# Patient Record
Sex: Female | Born: 1960 | Race: Black or African American | Hispanic: No | Marital: Married | Smoking: Never smoker
Health system: Southern US, Community
[De-identification: ages and names within clinical notes are randomized; demographics above are authoritative.]

## PROBLEM LIST (undated history)

## (undated) DIAGNOSIS — T7840XA Allergy, unspecified, initial encounter: Secondary | ICD-10-CM

## (undated) DIAGNOSIS — H409 Unspecified glaucoma: Secondary | ICD-10-CM

## (undated) DIAGNOSIS — M5136 Other intervertebral disc degeneration, lumbar region: Secondary | ICD-10-CM

## (undated) DIAGNOSIS — M51369 Other intervertebral disc degeneration, lumbar region without mention of lumbar back pain or lower extremity pain: Secondary | ICD-10-CM

## (undated) DIAGNOSIS — E785 Hyperlipidemia, unspecified: Secondary | ICD-10-CM

## (undated) DIAGNOSIS — J45909 Unspecified asthma, uncomplicated: Secondary | ICD-10-CM

## (undated) HISTORY — DX: Allergy, unspecified, initial encounter: T78.40XA

## (undated) HISTORY — DX: Unspecified asthma, uncomplicated: J45.909

## (undated) HISTORY — DX: Other intervertebral disc degeneration, lumbar region: M51.36

## (undated) HISTORY — DX: Unspecified glaucoma: H40.9

## (undated) HISTORY — PX: CARPAL TUNNEL RELEASE: SHX101

## (undated) HISTORY — PX: OTHER SURGICAL HISTORY: SHX169

## (undated) HISTORY — DX: Hyperlipidemia, unspecified: E78.5

## (undated) HISTORY — DX: Other intervertebral disc degeneration, lumbar region without mention of lumbar back pain or lower extremity pain: M51.369

---

## 1997-12-15 ENCOUNTER — Other Ambulatory Visit: Admission: RE | Admit: 1997-12-15 | Discharge: 1997-12-15 | Payer: Self-pay | Admitting: Obstetrics

## 1998-01-29 ENCOUNTER — Ambulatory Visit (HOSPITAL_COMMUNITY): Admission: RE | Admit: 1998-01-29 | Discharge: 1998-01-29 | Payer: Self-pay | Admitting: Obstetrics and Gynecology

## 1998-03-16 ENCOUNTER — Other Ambulatory Visit: Admission: RE | Admit: 1998-03-16 | Discharge: 1998-03-16 | Payer: Self-pay | Admitting: Obstetrics & Gynecology

## 1998-03-16 ENCOUNTER — Encounter: Admission: RE | Admit: 1998-03-16 | Discharge: 1998-03-16 | Payer: Self-pay | Admitting: Obstetrics & Gynecology

## 1998-11-04 ENCOUNTER — Emergency Department (HOSPITAL_COMMUNITY): Admission: EM | Admit: 1998-11-04 | Discharge: 1998-11-04 | Payer: Self-pay | Admitting: Emergency Medicine

## 1998-11-04 ENCOUNTER — Encounter: Payer: Self-pay | Admitting: Emergency Medicine

## 1999-06-15 ENCOUNTER — Emergency Department (HOSPITAL_COMMUNITY): Admission: EM | Admit: 1999-06-15 | Discharge: 1999-06-15 | Payer: Self-pay | Admitting: Emergency Medicine

## 1999-11-10 ENCOUNTER — Emergency Department (HOSPITAL_COMMUNITY): Admission: EM | Admit: 1999-11-10 | Discharge: 1999-11-11 | Payer: Self-pay | Admitting: Emergency Medicine

## 2000-07-09 ENCOUNTER — Other Ambulatory Visit: Admission: RE | Admit: 2000-07-09 | Discharge: 2000-07-09 | Payer: Self-pay | Admitting: Internal Medicine

## 2001-12-21 ENCOUNTER — Other Ambulatory Visit: Admission: RE | Admit: 2001-12-21 | Discharge: 2001-12-21 | Payer: Self-pay | Admitting: Gynecology

## 2002-12-12 ENCOUNTER — Other Ambulatory Visit: Admission: RE | Admit: 2002-12-12 | Discharge: 2002-12-12 | Payer: Self-pay | Admitting: Gynecology

## 2003-05-11 ENCOUNTER — Emergency Department (HOSPITAL_COMMUNITY): Admission: EM | Admit: 2003-05-11 | Discharge: 2003-05-12 | Payer: Self-pay | Admitting: Emergency Medicine

## 2003-05-17 ENCOUNTER — Emergency Department (HOSPITAL_COMMUNITY): Admission: EM | Admit: 2003-05-17 | Discharge: 2003-05-17 | Payer: Self-pay | Admitting: Emergency Medicine

## 2003-06-03 ENCOUNTER — Emergency Department (HOSPITAL_COMMUNITY): Admission: EM | Admit: 2003-06-03 | Discharge: 2003-06-03 | Payer: Self-pay | Admitting: Emergency Medicine

## 2003-06-03 ENCOUNTER — Encounter: Payer: Self-pay | Admitting: Emergency Medicine

## 2003-06-07 ENCOUNTER — Ambulatory Visit (HOSPITAL_COMMUNITY): Admission: RE | Admit: 2003-06-07 | Discharge: 2003-06-07 | Payer: Self-pay | Admitting: *Deleted

## 2003-07-05 ENCOUNTER — Encounter: Admission: RE | Admit: 2003-07-05 | Discharge: 2003-07-05 | Payer: Self-pay | Admitting: Family Medicine

## 2003-07-07 ENCOUNTER — Emergency Department (HOSPITAL_COMMUNITY): Admission: EM | Admit: 2003-07-07 | Discharge: 2003-07-07 | Payer: Self-pay | Admitting: Emergency Medicine

## 2003-08-02 ENCOUNTER — Ambulatory Visit (HOSPITAL_COMMUNITY): Admission: RE | Admit: 2003-08-02 | Discharge: 2003-08-02 | Payer: Self-pay | Admitting: Podiatry

## 2003-09-18 ENCOUNTER — Emergency Department (HOSPITAL_COMMUNITY): Admission: EM | Admit: 2003-09-18 | Discharge: 2003-09-18 | Payer: Self-pay | Admitting: Emergency Medicine

## 2003-09-27 ENCOUNTER — Encounter: Admission: RE | Admit: 2003-09-27 | Discharge: 2003-09-27 | Payer: Self-pay | Admitting: Family Medicine

## 2003-10-24 ENCOUNTER — Encounter: Admission: RE | Admit: 2003-10-24 | Discharge: 2004-01-22 | Payer: Self-pay | Admitting: Podiatry

## 2003-10-27 ENCOUNTER — Encounter: Admission: RE | Admit: 2003-10-27 | Discharge: 2003-10-27 | Payer: Self-pay | Admitting: Sports Medicine

## 2003-12-26 ENCOUNTER — Encounter: Admission: RE | Admit: 2003-12-26 | Discharge: 2003-12-26 | Payer: Self-pay | Admitting: Family Medicine

## 2004-01-14 ENCOUNTER — Emergency Department (HOSPITAL_COMMUNITY): Admission: EM | Admit: 2004-01-14 | Discharge: 2004-01-14 | Payer: Self-pay | Admitting: Emergency Medicine

## 2004-01-17 ENCOUNTER — Ambulatory Visit: Payer: Self-pay | Admitting: Family Medicine

## 2004-02-19 ENCOUNTER — Ambulatory Visit: Payer: Self-pay | Admitting: Sports Medicine

## 2004-03-06 ENCOUNTER — Ambulatory Visit: Payer: Self-pay | Admitting: Family Medicine

## 2004-03-18 ENCOUNTER — Observation Stay (HOSPITAL_COMMUNITY): Admission: AD | Admit: 2004-03-18 | Discharge: 2004-03-20 | Payer: Self-pay | Admitting: Orthopedic Surgery

## 2004-03-18 ENCOUNTER — Encounter (INDEPENDENT_AMBULATORY_CARE_PROVIDER_SITE_OTHER): Payer: Self-pay | Admitting: *Deleted

## 2004-03-23 ENCOUNTER — Emergency Department (HOSPITAL_COMMUNITY): Admission: EM | Admit: 2004-03-23 | Discharge: 2004-03-23 | Payer: Self-pay | Admitting: Emergency Medicine

## 2004-05-01 ENCOUNTER — Emergency Department (HOSPITAL_COMMUNITY): Admission: EM | Admit: 2004-05-01 | Discharge: 2004-05-01 | Payer: Self-pay

## 2004-05-02 ENCOUNTER — Ambulatory Visit: Payer: Self-pay | Admitting: Family Medicine

## 2004-05-19 ENCOUNTER — Emergency Department (HOSPITAL_COMMUNITY): Admission: EM | Admit: 2004-05-19 | Discharge: 2004-05-19 | Payer: Self-pay | Admitting: Emergency Medicine

## 2004-05-28 ENCOUNTER — Ambulatory Visit: Payer: Self-pay | Admitting: Family Medicine

## 2004-06-07 ENCOUNTER — Ambulatory Visit (HOSPITAL_COMMUNITY): Admission: RE | Admit: 2004-06-07 | Discharge: 2004-06-07 | Payer: Self-pay | Admitting: Family Medicine

## 2004-06-11 ENCOUNTER — Ambulatory Visit: Payer: Self-pay | Admitting: Family Medicine

## 2004-06-12 ENCOUNTER — Ambulatory Visit: Payer: Self-pay | Admitting: Family Medicine

## 2004-10-16 ENCOUNTER — Emergency Department (HOSPITAL_COMMUNITY): Admission: EM | Admit: 2004-10-16 | Discharge: 2004-10-16 | Payer: Self-pay | Admitting: Emergency Medicine

## 2004-11-08 ENCOUNTER — Ambulatory Visit: Payer: Self-pay | Admitting: Family Medicine

## 2004-11-15 ENCOUNTER — Ambulatory Visit: Payer: Self-pay | Admitting: Family Medicine

## 2004-12-04 ENCOUNTER — Other Ambulatory Visit: Admission: RE | Admit: 2004-12-04 | Discharge: 2004-12-04 | Payer: Self-pay | Admitting: Family Medicine

## 2004-12-04 ENCOUNTER — Ambulatory Visit: Payer: Self-pay | Admitting: Sports Medicine

## 2004-12-08 ENCOUNTER — Encounter (INDEPENDENT_AMBULATORY_CARE_PROVIDER_SITE_OTHER): Payer: Self-pay | Admitting: *Deleted

## 2004-12-08 LAB — CONVERTED CEMR LAB

## 2004-12-13 ENCOUNTER — Emergency Department (HOSPITAL_COMMUNITY): Admission: EM | Admit: 2004-12-13 | Discharge: 2004-12-13 | Payer: Self-pay | Admitting: Family Medicine

## 2004-12-14 ENCOUNTER — Emergency Department (HOSPITAL_COMMUNITY): Admission: EM | Admit: 2004-12-14 | Discharge: 2004-12-14 | Payer: Self-pay | Admitting: Family Medicine

## 2004-12-16 ENCOUNTER — Ambulatory Visit: Payer: Self-pay | Admitting: Family Medicine

## 2004-12-17 ENCOUNTER — Ambulatory Visit: Payer: Self-pay | Admitting: Family Medicine

## 2004-12-19 ENCOUNTER — Emergency Department (HOSPITAL_COMMUNITY): Admission: EM | Admit: 2004-12-19 | Discharge: 2004-12-20 | Payer: Self-pay | Admitting: *Deleted

## 2004-12-28 ENCOUNTER — Emergency Department (HOSPITAL_COMMUNITY): Admission: EM | Admit: 2004-12-28 | Discharge: 2004-12-28 | Payer: Self-pay | Admitting: Family Medicine

## 2005-01-03 ENCOUNTER — Ambulatory Visit: Payer: Self-pay | Admitting: Family Medicine

## 2005-02-26 ENCOUNTER — Emergency Department (HOSPITAL_COMMUNITY): Admission: EM | Admit: 2005-02-26 | Discharge: 2005-02-26 | Payer: Self-pay | Admitting: Family Medicine

## 2006-01-02 ENCOUNTER — Ambulatory Visit (HOSPITAL_COMMUNITY): Admission: RE | Admit: 2006-01-02 | Discharge: 2006-01-02 | Payer: Self-pay | Admitting: Family Medicine

## 2006-03-12 ENCOUNTER — Ambulatory Visit: Payer: Self-pay | Admitting: Sports Medicine

## 2006-05-15 ENCOUNTER — Other Ambulatory Visit: Admission: RE | Admit: 2006-05-15 | Discharge: 2006-05-15 | Payer: Self-pay | Admitting: Family Medicine

## 2006-05-19 ENCOUNTER — Encounter: Admission: RE | Admit: 2006-05-19 | Discharge: 2006-05-19 | Payer: Self-pay | Admitting: Family Medicine

## 2006-07-02 DIAGNOSIS — D179 Benign lipomatous neoplasm, unspecified: Secondary | ICD-10-CM | POA: Insufficient documentation

## 2006-07-02 DIAGNOSIS — N949 Unspecified condition associated with female genital organs and menstrual cycle: Secondary | ICD-10-CM | POA: Insufficient documentation

## 2006-07-02 DIAGNOSIS — J45909 Unspecified asthma, uncomplicated: Secondary | ICD-10-CM | POA: Insufficient documentation

## 2006-07-02 DIAGNOSIS — G43909 Migraine, unspecified, not intractable, without status migrainosus: Secondary | ICD-10-CM | POA: Insufficient documentation

## 2006-07-03 ENCOUNTER — Encounter (INDEPENDENT_AMBULATORY_CARE_PROVIDER_SITE_OTHER): Payer: Self-pay | Admitting: *Deleted

## 2007-02-09 ENCOUNTER — Ambulatory Visit: Payer: Self-pay | Admitting: Family Medicine

## 2007-02-12 ENCOUNTER — Encounter: Admission: RE | Admit: 2007-02-12 | Discharge: 2007-02-12 | Payer: Self-pay | Admitting: Family Medicine

## 2007-02-22 ENCOUNTER — Ambulatory Visit: Payer: Self-pay | Admitting: Family Medicine

## 2007-03-30 ENCOUNTER — Ambulatory Visit: Payer: Self-pay | Admitting: Family Medicine

## 2007-07-13 ENCOUNTER — Ambulatory Visit: Payer: Self-pay | Admitting: Family Medicine

## 2008-01-17 ENCOUNTER — Ambulatory Visit: Payer: Self-pay | Admitting: Family Medicine

## 2008-01-18 ENCOUNTER — Encounter: Admission: RE | Admit: 2008-01-18 | Discharge: 2008-01-18 | Payer: Self-pay | Admitting: Family Medicine

## 2008-01-21 ENCOUNTER — Encounter: Admission: RE | Admit: 2008-01-21 | Discharge: 2008-01-21 | Payer: Self-pay | Admitting: Family Medicine

## 2008-01-21 ENCOUNTER — Ambulatory Visit: Payer: Self-pay | Admitting: Family Medicine

## 2008-02-07 ENCOUNTER — Ambulatory Visit (HOSPITAL_COMMUNITY): Admission: RE | Admit: 2008-02-07 | Discharge: 2008-02-07 | Payer: Self-pay | Admitting: Family Medicine

## 2008-03-31 ENCOUNTER — Ambulatory Visit: Payer: Self-pay | Admitting: Family Medicine

## 2008-04-04 HISTORY — PX: ROTATOR CUFF REPAIR: SHX139

## 2008-04-27 ENCOUNTER — Emergency Department (HOSPITAL_COMMUNITY): Admission: EM | Admit: 2008-04-27 | Discharge: 2008-04-27 | Payer: Self-pay | Admitting: Emergency Medicine

## 2008-05-30 ENCOUNTER — Ambulatory Visit: Payer: Self-pay | Admitting: Family Medicine

## 2008-07-03 ENCOUNTER — Ambulatory Visit: Payer: Self-pay | Admitting: Family Medicine

## 2008-08-14 ENCOUNTER — Ambulatory Visit: Payer: Self-pay | Admitting: Family Medicine

## 2008-10-09 ENCOUNTER — Ambulatory Visit: Payer: Self-pay | Admitting: Family Medicine

## 2009-02-01 ENCOUNTER — Ambulatory Visit: Payer: Self-pay | Admitting: Family Medicine

## 2009-04-02 ENCOUNTER — Ambulatory Visit: Payer: Self-pay | Admitting: Family Medicine

## 2009-04-02 ENCOUNTER — Encounter: Admission: RE | Admit: 2009-04-02 | Discharge: 2009-04-02 | Payer: Self-pay | Admitting: Family Medicine

## 2009-04-08 ENCOUNTER — Emergency Department (HOSPITAL_COMMUNITY): Admission: EM | Admit: 2009-04-08 | Discharge: 2009-04-08 | Payer: Self-pay | Admitting: Family Medicine

## 2009-04-09 ENCOUNTER — Ambulatory Visit: Payer: Self-pay | Admitting: Family Medicine

## 2009-04-10 ENCOUNTER — Emergency Department (HOSPITAL_COMMUNITY): Admission: EM | Admit: 2009-04-10 | Discharge: 2009-04-10 | Payer: Self-pay | Admitting: Emergency Medicine

## 2009-04-11 ENCOUNTER — Ambulatory Visit: Payer: Self-pay | Admitting: Family Medicine

## 2009-04-17 ENCOUNTER — Encounter: Admission: RE | Admit: 2009-04-17 | Discharge: 2009-04-17 | Payer: Self-pay | Admitting: Family Medicine

## 2009-04-18 ENCOUNTER — Ambulatory Visit: Payer: Self-pay | Admitting: Family Medicine

## 2009-04-19 ENCOUNTER — Encounter: Admission: RE | Admit: 2009-04-19 | Discharge: 2009-04-19 | Payer: Self-pay | Admitting: Neurosurgery

## 2009-04-25 ENCOUNTER — Ambulatory Visit (HOSPITAL_COMMUNITY): Admission: RE | Admit: 2009-04-25 | Discharge: 2009-04-26 | Payer: Self-pay | Admitting: Neurosurgery

## 2009-06-06 ENCOUNTER — Ambulatory Visit: Payer: Self-pay | Admitting: Family Medicine

## 2009-12-17 ENCOUNTER — Ambulatory Visit: Payer: Self-pay | Admitting: Family Medicine

## 2010-01-28 ENCOUNTER — Ambulatory Visit: Payer: Self-pay | Admitting: Family Medicine

## 2010-05-27 ENCOUNTER — Encounter: Payer: Self-pay | Admitting: Family Medicine

## 2010-06-03 ENCOUNTER — Ambulatory Visit
Admission: RE | Admit: 2010-06-03 | Discharge: 2010-06-03 | Payer: Self-pay | Source: Home / Self Care | Attending: Family Medicine | Admitting: Family Medicine

## 2010-06-03 DIAGNOSIS — M545 Low back pain: Secondary | ICD-10-CM | POA: Insufficient documentation

## 2010-06-03 DIAGNOSIS — F411 Generalized anxiety disorder: Secondary | ICD-10-CM | POA: Insufficient documentation

## 2010-06-03 DIAGNOSIS — I10 Essential (primary) hypertension: Secondary | ICD-10-CM | POA: Insufficient documentation

## 2010-06-03 DIAGNOSIS — M5126 Other intervertebral disc displacement, lumbar region: Secondary | ICD-10-CM | POA: Insufficient documentation

## 2010-06-04 ENCOUNTER — Encounter: Payer: Self-pay | Admitting: Family Medicine

## 2010-06-11 ENCOUNTER — Encounter (HOSPITAL_COMMUNITY)
Admission: RE | Admit: 2010-06-11 | Discharge: 2010-06-11 | Disposition: A | Discharge status: Home / Self Care | Payer: Managed Care, Other (non HMO) | Source: Ambulatory Visit | Attending: Orthopedic Surgery | Admitting: Orthopedic Surgery

## 2010-06-11 ENCOUNTER — Ambulatory Visit (HOSPITAL_COMMUNITY)
Admission: RE | Admit: 2010-06-11 | Discharge: 2010-06-11 | Disposition: A | Discharge status: Home / Self Care | Payer: Managed Care, Other (non HMO) | Source: Ambulatory Visit | Attending: Orthopedic Surgery | Admitting: Orthopedic Surgery

## 2010-06-11 ENCOUNTER — Other Ambulatory Visit (HOSPITAL_COMMUNITY): Payer: Self-pay | Admitting: Orthopedic Surgery

## 2010-06-11 DIAGNOSIS — M545 Low back pain, unspecified: Secondary | ICD-10-CM | POA: Insufficient documentation

## 2010-06-11 DIAGNOSIS — IMO0002 Reserved for concepts with insufficient information to code with codable children: Secondary | ICD-10-CM

## 2010-06-11 DIAGNOSIS — Z01818 Encounter for other preprocedural examination: Secondary | ICD-10-CM | POA: Insufficient documentation

## 2010-06-11 LAB — BASIC METABOLIC PANEL
CO2: 28 mEq/L (ref 19–32)
Calcium: 9.6 mg/dL (ref 8.4–10.5)
Chloride: 107 mEq/L (ref 96–112)
GFR calc Af Amer: >60 mL/min (ref >60)
Potassium: 4.7 mEq/L (ref 3.5–5.1)
Sodium: 142 mEq/L (ref 135–145)

## 2010-06-11 LAB — CBC
HCT: 36.4 % (ref 36.0–46.0)
MCH: 27.7 pg (ref 26.0–34.0)
MCHC: 33.5 g/dL (ref 30.0–36.0)
MCV: 82.5 fL (ref 78.0–100.0)
RBC: 4.41 MIL/uL (ref 3.87–5.11)
RDW: 12.3 % (ref 11.5–15.5)
WBC: 6.7 10*3/uL (ref 4.0–10.5)

## 2010-06-11 LAB — TYPE AND SCREEN: Antibody Screen: NEGATIVE

## 2010-06-11 LAB — ABO/RH: ABO/RH(D): A POS

## 2010-06-11 LAB — SURGICAL PCR SCREEN: Staphylococcus aureus: NEGATIVE

## 2010-06-12 NOTE — Assessment & Plan Note (Signed)
Summary: CHECK UP/EVM   Vital Signs:  Patient Profile:   50 Years Old Female CC:      elevated blood pressure and anxiety Temp:     98.6 degrees F oral Pulse rate:   82 / minute Pulse rhythm:   regular Resp:     16 per minute BP sitting:   159 / 105  (right arm)  Pt. in pain?   yes    Location:   back                   Current Allergies: No known allergies History of Present Illness History from: patient Reason for visit: follow up of multiple problems Chief Complaint: elevated blood pressure and anxiety History of Present Illness: The patient is presenting today for an evaluation for elevated blood pressure and an anxiety disorder.  She was previously seen by me at [facility].  She is reporting that she stopped the medications that I had given her for anxiety.  She is not sure the name of the medication and we don't have her records here from [facility].  The patient reports that she is struggling with her teenage daughter at home and is having anxiety episodes and constant worrying about her daughter.  The patient is reporting that her blood pressure has also been elevated and has been 180/100 at times.  She says that she is not taking any blood pressure lowering medications at this time.   She denies CP, SOB, n/v/d.  Pt has had some palpatations to report as well.    REVIEW OF SYSTEMS Constitutional Symptoms      Denies fever, chills, night sweats, weight loss, weight gain, and fatigue.  Eyes       Denies change in vision, eye pain, eye discharge, glasses, contact lenses, and eye surgery. Ear/Nose/Throat/Mouth       Denies hearing loss/aids, change in hearing, ear pain, ear discharge, dizziness, frequent runny nose, frequent nose bleeds, sinus problems, sore throat, hoarseness, and tooth pain or bleeding.  Respiratory       Complains of shortness of breath and asthma.      Denies dry cough, productive cough, wheezing, bronchitis, and emphysema/COPD.       Comments: SOB related to asthma attacks per patient.  Asthma attacks have been rare per patient.  Cardiovascular       Denies murmurs, chest pain, and tires easily with exhertion.    Gastrointestinal       Denies stomach pain, nausea/vomiting, diarrhea, constipation, blood in bowel movements, and indigestion. Genitourniary       Denies painful urination, kidney stones, and loss of urinary control. Neurological       Denies paralysis, seizures, and fainting/blackouts. Musculoskeletal       Denies muscle pain, joint pain, joint stiffness, decreased range of motion, redness, swelling, muscle weakness, and gout.  Skin       Denies bruising, unusual mles/lumps or sores, and hair/skin or nail changes.  Psych       Complains of anxiety/stress and sleep problems.      Denies mood changes, temper/anger issues, speech problems, and depression.  Past History:  Family History: Last updated: 06/03/2010 1 brother, 1 sister- healthy, Father 79, healthy, Mother died age 35, unknown cause, no family h/o cancer, heart dz, asthma, diabetes mellitus  Social History: Last updated: 06/03/2010 Moved from [place] 1986.  3 daughters.  Lives w/ 2 daughters and her husband who smokes.  Does not exercise. No  Tobacco, ETOH, or recreational drugs.  Past Medical History: G2P2, vision probs--> referred to optho, weight gain after moving to [place] HTN Asthma Generalized Anxiety Disorder DDD lumbar spine with herniated disc  Past Surgical History: R bunion surgery 2/05 - 07/05/2003 Back Surgery 2010 Anterior Discectomy Jan 2011  Family History: 1 brother, 1 sister- healthy, Father 38, healthy, Mother died age 67, unknown cause, no family h/o cancer, heart dz, asthma, diabetes mellitus  Social History: Moved from [place] 1986.  3 daughters.  Lives w/ 2 daughters and her husband who smokes.  Does not exercise. No Tobacco, ETOH, or recreational drugs. Physical Exam General appearance: well developed, well  nourished, no acute distress Head: normocephalic, atraumatic Eyes: conjunctivae and lids normal Pupils: equal, round, reactive to light Ears: normal, no lesions or deformities Nasal: mucosa pink, nonedematous, no septal deviation, turbinates normal Oral/Pharynx: tongue normal, posterior pharynx without erythema or exudate Neck: neck supple,  trachea midline, no masses Chest/Lungs: no rales, wheezes, or rhonchi bilateral, breath sounds equal without effort Heart: regular rate and  rhythm, no murmur Abdomen: soft, non-tender without obvious organomegaly Extremities: normal extremities Neurological: grossly intact and non-focal, some mild gait instability Back: tenderness in the lumber spine/ pt walking with 1 point cane for stability Skin: no obvious rashes or lesions MSE: oriented to time, place, and person Assessment New Problems: GENERALIZED ANXIETY DISORDER (ICD-300.02) UNSPECIFIED ESSENTIAL HYPERTENSION (ICD-401.9) 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 The risks, benefits and possible side effects were clearly explained and discussed with the patient.  The patient verbalized clear understanding.  The patient was given instructions to return if symptoms don't improve, worsen or new changes develop.  If it is not during clinic hours and the patient cannot get back to this clinic then the patient was told to seek medical care at an available urgent care or emergency department.  The patient verbalized understanding.   Demonstrates willingness to comply.  Plan New Medications/Changes: PAROXETINE HCL 20 MG TABS (PAROXETINE HCL) take 1 tablet by mouth every morning for anxiety  #30 x 2, 06/03/2010, Clanford Johnson MD LISINOPRIL-HYDROCHLOROTHIAZIDE 10-12.5 MG TABS (LISINOPRIL-HYDROCHLOROTHIAZIDE) take 1 by mouth daily for blood pressure  #30 x 2, 06/03/2010, Clanford  Johnson MD  Planning Comments:   Have patient sign for release of medical records from [facility] Have patient return in 6 weeks for blood work and reassessment Return or go to the ER if no improvement or symptoms getting worse.  The patient verbalized clear understanding.     Follow Up: Follow up in 2-3 days if no improvement, Follow up on an as needed basis, Follow up with Primary Physician Follow Up: 6 weeks for recheck and bloodwork  The patient and/or caregiver has been counseled thoroughly with regard to medications prescribed including dosage, schedule, interactions, rationale for use, and possible side effects and they verbalize understanding.  Diagnoses and expected course of recovery discussed and will return if not improved as expected or if the condition worsens. Patient and/or caregiver verbalized understanding.  Prescriptions: PAROXETINE HCL 20 MG TABS (PAROXETINE HCL) take 1 tablet by mouth every morning for anxiety  #30 x 2   Entered and Authorized by:   Standley Dakins MD   Signed by:   Standley Dakins MD on 06/03/2010   Method used:   Electronically to        [facility] [address] (retail)  [address]       [place], [place]  [address]       Ph: [phone]       Fax: [phone]   RxID:   [id number] LISINOPRIL-HYDROCHLOROTHIAZIDE 10-12.5 MG TABS (LISINOPRIL-HYDROCHLOROTHIAZIDE) take 1 by mouth daily for blood pressure  #30 x 2   Entered and Authorized by:   Standley Dakins MD   Signed by:   Standley Dakins MD on 06/03/2010   Method used:   Electronically to        [facility] [address] (retail)       [address], [address]       [place], [place]  [address]       Ph: [phone]       Fax: [phone]   RxID:   [id number]   Patient Instructions: 1)  Go to the pharmacy and pick up your prescription (s).  It may take up to 30 mins for electronic  prescriptions to be delivered to the pharmacy.  Please call if your pharmacy has not received your prescriptions after 30 minutes.   2)  Check your Blood Pressure regularly. If it is above: 140/90  you should make an appointment. 3)  Limit your Sodium (Salt) to less than 2 grams a day(slightly less than 1/2 a teaspoon) to prevent fluid retention, swelling, or worsening of symptoms. 4)  Please return in 6 weeks or sooner for recheck.  Also, at that visit we need to test your bloodwork.  5)  Return or go to the ER if no improvement or symptoms getting worse.   6)  Please have your neurosurgeon send me your medical records.  7)  The patient was informed that there is no on-call provider or services available at this clinic during off-hours (when the clinic is closed).  If the patient developed a problem or concern that required immediate attention, the patient was advised to go the the nearest available urgent care or emergency department for medical care.  The patient verbalized understanding.

## 2010-06-13 ENCOUNTER — Other Ambulatory Visit (HOSPITAL_COMMUNITY): Payer: Self-pay | Admitting: Orthopedic Surgery

## 2010-06-13 ENCOUNTER — Ambulatory Visit (HOSPITAL_COMMUNITY)
Admission: RE | Admit: 2010-06-13 | Discharge: 2010-06-13 | Disposition: A | Discharge status: Home / Self Care | Payer: Managed Care, Other (non HMO) | Source: Ambulatory Visit | Attending: Orthopedic Surgery | Admitting: Orthopedic Surgery

## 2010-06-13 ENCOUNTER — Inpatient Hospital Stay (HOSPITAL_COMMUNITY)
Admission: RE | Admit: 2010-06-13 | Discharge: 2010-06-17 | DRG: 460 | Disposition: A | Discharge status: Home / Self Care | Payer: Managed Care, Other (non HMO) | Source: Ambulatory Visit | Attending: Orthopedic Surgery | Admitting: Orthopedic Surgery

## 2010-06-13 ENCOUNTER — Inpatient Hospital Stay (HOSPITAL_COMMUNITY): Payer: Managed Care, Other (non HMO)

## 2010-06-13 DIAGNOSIS — I10 Essential (primary) hypertension: Secondary | ICD-10-CM | POA: Diagnosis present

## 2010-06-13 DIAGNOSIS — M47817 Spondylosis without myelopathy or radiculopathy, lumbosacral region: Secondary | ICD-10-CM | POA: Diagnosis present

## 2010-06-13 DIAGNOSIS — Z888 Allergy status to other drugs, medicaments and biological substances status: Secondary | ICD-10-CM

## 2010-06-13 DIAGNOSIS — M961 Postlaminectomy syndrome, not elsewhere classified: Principal | ICD-10-CM | POA: Diagnosis present

## 2010-06-13 DIAGNOSIS — M549 Dorsalgia, unspecified: Secondary | ICD-10-CM

## 2010-06-14 ENCOUNTER — Inpatient Hospital Stay (HOSPITAL_COMMUNITY): Payer: Managed Care, Other (non HMO)

## 2010-06-14 DIAGNOSIS — M79609 Pain in unspecified limb: Secondary | ICD-10-CM

## 2010-06-17 NOTE — Op Note (Addendum)
NAMENALLELY, YOST NO.:  [id number]  MEDICAL RECORD NO.:  [id number]           PATIENT TYPE:  LOCATION:                               FACILITY:  [facility]  PHYSICIAN:  Larina Earthly, M.D.    DATE OF BIRTH:  05/19/1960  DATE OF PROCEDURE:  06/13/2010 DATE OF DISCHARGE:  06/13/2010                              OPERATIVE REPORT   PREOPERATIVE DIAGNOSIS:  Lumbar disk disease L4-5.  POSTOPERATIVE DIAGNOSIS:  Lumbar disk disease L4-5.  PROCEDURE:  Anterior exposure for ALIF which will be dictated as a separate note for Dr. Venita Lick.  SURGEON:  For the exposure, Dr. Arbie Cookey, Dr. Shon Baton.  ANESTHESIA:  General endotracheal.  COMPLICATIONS:  None.  DISPOSITION:  Recovery room stable.  PROCEDURE IN DETAIL:  The patient was taken to the operating room and placed in a supine position where the abdomen was prepped and draped in usual sterile fashion.  Lateral C-arm projection revealed the level of the 4-5 disk.  The patient had transitional anatomy.  An incision was made from the level of the midline transversely to the left, carried down through the subcutaneous fat to the level of the anterior rectus sheath.  The fat was mobilized off the anterior rectus sheath for ability of closure.  The anterior rectus sheath was opened transversely in line with a skin incision.  The rectus muscle was mobilized proximally and distally and circumferentially.  The retroperitoneal space was entered laterally and the peritoneal sac was mobilized to the right.  The peritoneal sac was not entered.  Ureter was mobilized to the right as well.  The dissection plane was continued anterior to the psoas muscle.  The posterior rectus sheath was opened laterally.  The iliac vessels were mobilized to the right.  The iliolumbar vein was identified, was ligated with silk sutures, and was divided.  This gave complete mobilization of the left iliac vein to allow this to be positioned to the  right of the vertebral body.  The L4-5 disk was identified.  An additional lumbar vein was ligated on the left side of the iliac vessel.  The [facility] retractor was brought onto the field and the 150-reverse blades were placed in the right and left of the L4-5 disk, the malleable blades were used to give inferior and posterior exposure.  The C-arm was brought back on the field to confirm this was indeed L4-5 disk.  The remaining of the procedure will be dictated as a separate note by Dr. Venita Lick.    Larina Earthly, M.D.    TFE/MEDQ  D:  06/14/2010  T:  06/14/2010  Job:  [id number]  Electronically Signed by TODD EARLY M.D. on 07/09/2010 09:59:59 AM Electronically Signed by TODD EARLY M.D. on 07/09/2010 10:14:01 AM Electronically Signed by Tawanna Cooler EARLY M.D. on 07/09/2010 10:23:05 AM Electronically Signed by TODD EARLY M.D. on 07/09/2010 10:32:08 AM Electronically Signed by TODD EARLY M.D. on 07/09/2010 10:41:07 AM Electronically Signed by Tawanna Cooler EARLY M.D. on 07/09/2010 10:50:34 AM Electronically Signed by Tawanna Cooler EARLY M.D. on 07/09/2010 11:00:50 AM Electronically Signed by Tawanna Cooler EARLY M.D. on 07/09/2010 11:10:46 AM Electronically Signed by  TODD EARLY M.D. on 07/09/2010 11:21:57 AM Electronically Signed by Tawanna Cooler EARLY M.D. on 07/09/2010 11:34:50 AM Electronically Signed by Tawanna Cooler EARLY M.D. on 07/09/2010 11:48:14 AM Electronically Signed by TODD EARLY M.D. on 07/09/2010 12:02:34 PM Electronically Signed by Tawanna Cooler EARLY M.D. on 07/09/2010 12:17:01 PM Electronically Signed by Tawanna Cooler EARLY M.D. on 07/09/2010 12:33:05 PM Electronically Signed by Tawanna Cooler EARLY M.D. on 07/09/2010 12:50:52 PM Electronically Signed by Tawanna Cooler EARLY M.D. on 07/09/2010 01:08:52 PM Electronically Signed by Tawanna Cooler EARLY M.D. on 07/09/2010 01:30:03 PM Electronically Signed by Tawanna Cooler EARLY M.D. on 07/09/2010 01:51:01 PM Electronically Signed by Tawanna Cooler EARLY M.D. on 07/09/2010 02:10:48 PM Electronically Signed by Tawanna Cooler EARLY M.D. on  07/09/2010 02:31:34 PM Electronically Signed by Tawanna Cooler EARLY M.D. on 07/09/2010 02:54:53 PM Electronically Signed by Tawanna Cooler EARLY M.D. on 07/09/2010 03:19:40 PM Electronically Signed by Tawanna Cooler EARLY M.D. on 07/09/2010 03:43:58 PM Electronically Signed by Tawanna Cooler EARLY M.D. on 07/09/2010 04:21:05 PM

## 2010-06-20 NOTE — Discharge Summary (Addendum)
  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  OPERATIVE PROCEDURE:  Anterior lumbar interbody fusion, L4-5.  COMPLICATIONS:  None.  CONDITION:  Stable.  HISTORY:  This is a pleasant 50 year old woman who underwent a lumbar diskectomy in the past for back and radicular leg pain.  The patient continued to have significant issues and did not get adequate relief. She eventually saw me.  Despite appropriate conservative management consisting of injection therapy, physiotherapy, and various medications, her pains persisted.  At this point in time, because of the failure to improve, the patient elected to proceed with surgery.  All appropriate risks, benefits, and alternatives were discussed and consent was obtained.  Please refer to the attached dictated H and P notes from my office, which are included on her chart for specifics.  HOSPITAL COURSE:  On the day of admission, the patient was taken to the operating room and had an uneventful L4-5 anterior interbody fusion.  On postoperative day 1, her pulses were intact.  The wound was clean, dry and intact, and she was ambulating.  Lower extremity Doppler revealed no evidence of DVT, superficial thrombus, or Baker cyst.  The patient was seen by Physical Therapy.  She was mobilizing well.  She did complain of significant pain, but her pain meds were adjusted, and this did help her improve.  On postoperative day #2 and #3, she continued to  mobilize. She was voiding spontaneously and she was having flatus.  On the date of admission, the patient was given medications for OxyContin 20 mg b.i.d. with Percocet 10/325 for breakthrough, Robaxin, Lovenox for 6 days, Zofran for nausea, milk of magnesia, and Colace for her bowel prep.  Preprinted instructions were provided to the patient. At this point, she will be discharged to home with appropriate followup with me in 2 weeks, instructions were given as well.    Alvy Beal, MD    DDB/MEDQ  D:  06/17/2010  T:  06/17/2010  Job:  [id number]  Electronically Signed by Venita Lick MD on 06/29/2010 10:51:35 PM

## 2010-06-20 NOTE — Op Note (Addendum)
Jessica Mayer, Jessica Mayer               ACCOUNT NO.:  [id number]  MEDICAL RECORD NO.:  [id number]           PATIENT TYPE:  O  LOCATION:  XRAY                         FACILITY:  [facility]  PHYSICIAN:  Alvy Beal, MD    DATE OF BIRTH:  09-May-1960  DATE OF PROCEDURE: DATE OF DISCHARGE:                              OPERATIVE REPORT   PREOPERATIVE DIAGNOSIS:  Post laminectomy syndrome with ongoing diskogenic low back pain.  POSTOPERATIVE DIAGNOSIS:  Post laminectomy syndrome with ongoing diskogenic low back pain.  PROCEDURE:  Anterior lumbar interbody fusion L4-5 utilizing the RSB 16 x 39 x 8 degree PEEK cage and a 16 zero profile anterior plate.  VASCULAR SURGEON APPROACH:  Larina Earthly, MD  ESTIMATED BLOOD LOSS:  100 mL.  TOTAL RETRACTION TIME:  55 minutes.  COMPLICATIONS:  No adverse complications during surgery.  CONDITION:  Stable.  HISTORY:  This is a very pleasant 50 year old woman who in the past had a lumbar laminectomy decompression.  She continued to have significant degenerating back pain.  Attempts at conservative management consisting of physical therapy, injection therapy, activity modification, narcotic and nonnarcotic medications had failed.  As a result of the ongoing pain and the postlaminectomy syndrome, I elected to proceed with an anterior lumbar interbody fusion.  All appropriate risks, benefits, and alternatives were discussed with the patient.  Consent was obtained.  OPERATIVE NOTE:  The patient was brought to the operating room, placed supine on the operating table.  After successful induction of general anesthesia and tracheal intubation, TEDs, SCDs, and Foley were inserted. A pulse ox was placed on the left great toe.  Appropriate time-out was then done to confirm patient, procedure, and all other pertinent important data.  At this point in time, the patient is positioned and time-out completed. Dr. Arbie Cookey then performed a standard retroperitoneal  left-sided approach to the lumbar spine.  Please refer to his dictation for specifics.  Once he had completed the approach, I then went to the left-hand side of the patient and we placed a needle into the L4-5 disk space.  I took an intraoperative lateral fluoro, compared it to the 2010 and more recent 2011 MRI, and the preoperative lumbar x-rays, all confirming that I was at the L4-5 level.  Once I had confirmed this, I then performed an annulotomy with a #10-blade scalpel; and then using series of large rongeurs, pituitary rongeurs, curettes and Kerrison rongeurs, I removed all the disk material at L4-5.  I then used a fine curved curette to resect and release the annulus from the posterior aspect of the L4 and the L5 vertebral body.  I then used a 2-mm Kerrison to remove the osteophytes and completely released the posterior annulus.  At this point, under live fluoro, I was able to open and close, expand, distract, and release the disk space and confirmed I had parallel distraction of the endplates.  Once this was done, I then measured conventionally with a size 14 and size 16 large rasp and then I noted the 16 had excellent press fit.  At this point, with a good fixation with the rasp,  I removed it and I had nice bleeding endplates.  I then placed the size 16, 8-degree lordotic large RSB paradigm PEEK spacer packed with chronOS and DBX.  I had excellent fit.  It was properly positioned.  I then placed a 16-mm zero profile anterior plate and secured it to the L4 vertebral body with 220-mm screws and L5 with a single 20-mm screw.  I then irrigated copiously with normal saline and then placed a final locking cap.  At this point in time, I copiously irrigated the wound and I sequentially removed the Thompson retractors and there was no significant bleeding.  At this point, I then closed the rectus sheath with a running #1 Vicryl suture and then a deep subcutaneous with running #1 Vicryl  suture.  I then closed superficial with 2-0 Vicryl sutures and a 3-0 Monocryl for the skin.  Intraoperative digital AP x-ray confirmed that there was no surgical material left other than the implant and vascular clips.  Steri-Strips, dry dressing were applied.  The patient was extubated, transferred to the PACU without incident.     Alvy Beal, MD     DDB/MEDQ  D:  06/13/2010  T:  06/14/2010  Job:  [id number]  Electronically Signed by Venita Lick MD on 06/29/2010 10:51:39 PM

## 2010-06-26 ENCOUNTER — Encounter: Payer: Self-pay | Admitting: Family Medicine

## 2010-07-02 NOTE — Letter (Signed)
Summary: [employer]  [employer]   Imported By: Dorna Leitz 06/26/2010 19:55:47  _____________________________________________________________________  External Attachment:    Type:   Image     Comment:   External Document

## 2010-07-02 NOTE — Letter (Signed)
Summary: [employer]  [employer]   Imported By: Dorna Leitz 06/26/2010 18:12:54  _____________________________________________________________________  External Attachment:    Type:   Image     Comment:   External Document

## 2010-08-05 LAB — CBC
HCT: 34.9 % — ABNORMAL LOW (ref 36.0–46.0)
Platelets: 184 10*3/uL (ref 150–400)
RDW: 12.7 % (ref 11.5–15.5)

## 2010-08-05 LAB — URINALYSIS, ROUTINE W REFLEX MICROSCOPIC
Bilirubin Urine: NEGATIVE
Ketones, ur: NEGATIVE mg/dL
Nitrite: NEGATIVE
Specific Gravity, Urine: 1.015 (ref 1.005–1.030)
Urobilinogen, UA: 0.2 mg/dL (ref 0.0–1.0)

## 2010-08-20 ENCOUNTER — Ambulatory Visit: Payer: Managed Care, Other (non HMO) | Attending: Orthopedic Surgery

## 2010-08-20 DIAGNOSIS — M545 Low back pain, unspecified: Secondary | ICD-10-CM | POA: Insufficient documentation

## 2010-08-20 DIAGNOSIS — R5381 Other malaise: Secondary | ICD-10-CM | POA: Insufficient documentation

## 2010-08-20 DIAGNOSIS — R262 Difficulty in walking, not elsewhere classified: Secondary | ICD-10-CM | POA: Insufficient documentation

## 2010-08-20 DIAGNOSIS — IMO0001 Reserved for inherently not codable concepts without codable children: Secondary | ICD-10-CM | POA: Insufficient documentation

## 2010-08-20 DIAGNOSIS — M6281 Muscle weakness (generalized): Secondary | ICD-10-CM | POA: Insufficient documentation

## 2010-08-27 ENCOUNTER — Ambulatory Visit: Payer: Managed Care, Other (non HMO) | Admitting: Physical Therapy

## 2010-08-30 ENCOUNTER — Ambulatory Visit: Payer: Managed Care, Other (non HMO) | Admitting: Physical Therapy

## 2010-09-03 ENCOUNTER — Ambulatory Visit: Payer: Managed Care, Other (non HMO) | Admitting: Physical Therapy

## 2010-09-06 ENCOUNTER — Ambulatory Visit: Payer: Managed Care, Other (non HMO) | Attending: Orthopedic Surgery

## 2010-09-06 DIAGNOSIS — M6281 Muscle weakness (generalized): Secondary | ICD-10-CM | POA: Insufficient documentation

## 2010-09-06 DIAGNOSIS — M545 Low back pain, unspecified: Secondary | ICD-10-CM | POA: Insufficient documentation

## 2010-09-06 DIAGNOSIS — R262 Difficulty in walking, not elsewhere classified: Secondary | ICD-10-CM | POA: Insufficient documentation

## 2010-09-06 DIAGNOSIS — IMO0001 Reserved for inherently not codable concepts without codable children: Secondary | ICD-10-CM | POA: Insufficient documentation

## 2010-09-06 DIAGNOSIS — R5381 Other malaise: Secondary | ICD-10-CM | POA: Insufficient documentation

## 2010-09-10 ENCOUNTER — Ambulatory Visit: Payer: Managed Care, Other (non HMO) | Admitting: Physical Therapy

## 2010-09-17 ENCOUNTER — Ambulatory Visit: Payer: Managed Care, Other (non HMO) | Admitting: Physical Therapy

## 2010-09-20 NOTE — Op Note (Signed)
Jessica Mayer, Jessica Mayer               ACCOUNT NO.:  [id number]   MEDICAL RECORD NO.:  [id number]          PATIENT TYPE:  OBV   LOCATION:  0449                         FACILITY:  [facility]   PHYSICIAN:  Dionne Ano. Gramig III, M.D.DATE OF BIRTH:  October 08, 1960   DATE OF PROCEDURE:  03/18/2004  DATE OF DISCHARGE:                                 OPERATIVE REPORT   PREOPERATIVE DIAGNOSIS:  Right hand pain and swelling with history of  previous carpal tunnel release, rule out indolent infection/tenosynovitis.   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   SURGICAL PROCEDURE PERFORMED:  1.  Median nerve exploration and neurolysis, right wrist.  2.  Tenosynovectomy, right wrist about the carpal contents.   SURGEON:  Dominica Severin, M.D.   ASSISTANT:  Karie Chimera, P.A.-C.   COMPLICATIONS:  None.   ANESTHESIA:  General.   TOURNIQUET TIME:  Less than 1 hour.   DRAINS:  One.   CULTURES:  One.   SPECIMENS:  One.   INDICATIONS FOR PROCEDURE:  Jessica Mayer is a 50 year old female, who has had  prior carpal tunnel release 6 days ago.  She had a mass removal from the  proximal forearm as well which was consistent with a benign lipoma as  demonstrated by path report.  She presented due to pain and was evaluated  over the weekend.  I discussed with her upper extremity predicament.  She  complained of continuing numbness in the index, middle, and ring finger.  Her thumb was normal, and her abductor pollicis brevis function was normal.  I discussed with the patient her findings.  I felt that it was imperative to  rule out infectious process given her symptoms and her complaints.  I felt  that it was imperative to rule out median nerve injury and/or indolent  infection or other process.  I discussed her risks and benefits at great  length, and she desired to proceed.   OPERATIVE FINDINGS:  This patient had an  intact median nerve.  I performed a  neurolysis and found no abnormal pathology or iatrogenic injury about the  nerve.  The nerve was released and underwent a neurolysis.  I performed a  tenosynovectomy and sent this for specimen.  There was no gross purulence;  however, we will await final culture results.   OPERATION IN DETAIL:  The patient was seen by myself and anesthesia, taken  to the operative suite, underwent a smooth induction of general anesthesia,  was given preoperative Ancef, laid supine, appropriately padded, prepped,  and draped in the usual sterile fashion about the right upper extremity.  Once the sterile prep was secured, the arm was elevated and tourniquet  insufflated to 250 mmHg, and the patient then underwent removal of prior  stitches x 4 by the carpal tunnel release site in the right forearm and  wrist.  These stitches were removed without difficulty.  Following this, I  performed an extended carpal tunnel incision crossing the wrist at corners  well demarcated so as not to cause scarring.  Once this was done, I  carefully made sure that the incision was based ulnarly so there would be a  nice flap over the median nerve.  I elevated the area and then dissected  from a plane of normal to previously released carpal tunnel sliced  transverse carpal ligament tissue.  The most proximal portion of the  antebrachial fascia was released.  I dissected this towards the carpal  canal.  I noted that the transverse carpal ligament had been previously  released.  I then performed a very meticulous dissection of the median nerve  and performed a median nerve neurolysis.  I did not see any iatrogenic  injury or other problems.  There was certain hyperemia to the median nerve  but certainly no encroachment on the nerve, iatrogenic injury or disruption,  etc.  The nerve was inflamed somewhat at the proximal edge of the carpal  tunnel release site and had a bulbous shape here,  indicative of  constriction; however, certainly there was no disruption, fascicular injury,  epineural injury, etc.  With this noted, I thoroughly inspected the nerve  including the thenar motor branches and the common digital nerve divisions  distally.  She was dissected from the superficial palmar arch to an area 3-  1/2 inches above the proximal wrist crease.  Following this, I then  identified the tenosynovioma and performed a tenosynovectomy of the FDP and  FDS tendons.  This was an extensive tenosynovectomy, sent for culture and  specimen.  The patient tolerated this well.  Following this, I deflated the  tourniquet and obtained hemostasis.  The median nerve was once again  checked.  All looked quite well.  It was hyperemic, as described.  A #7 TLS  drain was placed.  The wound was then closed with interrupted 4-0 Prolene  suture.  I did perform a debridement of fatty tissue and noted there were  some small areas of fatty necrosis.  The wound closed nicely.  The drain was  hooked up to suction.  Sterile dressing and a splint was applied to cup the  hand slightly.  The patient's proximal incision for the lipoma removal  looked excellent.  There were no signs of compartment syndrome, significant  hematoma, or other findings and preoperatively, I should note the patient  was nontender here.  She did not have any proximal median nerve entrapment  signs preoperatively.   She was taken to the recovery room.  She will be monitored overnight, placed  on an antibiotic, watch her cultures, and I have discussed all issues with  the family including the operative findings.      WMG/MEDQ  D:  03/18/2004  T:  03/18/2004  Job:  [id number]

## 2010-09-24 ENCOUNTER — Ambulatory Visit: Payer: Managed Care, Other (non HMO) | Admitting: Physical Therapy

## 2010-10-01 ENCOUNTER — Encounter: Payer: Managed Care, Other (non HMO) | Admitting: Physical Therapy

## 2010-11-26 ENCOUNTER — Encounter: Payer: Self-pay | Admitting: Family Medicine

## 2011-02-07 LAB — CBC
MCHC: 33.6 g/dL (ref 30.0–36.0)
RBC: 4.51 MIL/uL (ref 3.87–5.11)
RDW: 11.9 % (ref 11.5–15.5)

## 2011-02-07 LAB — COMPREHENSIVE METABOLIC PANEL
ALT: 26 U/L (ref 0–35)
AST: 23 U/L (ref 0–37)
Calcium: 9.2 mg/dL (ref 8.4–10.5)
GFR calc Af Amer: >60 mL/min (ref >60)
Sodium: 137 mEq/L (ref 135–145)
Total Protein: 7.2 g/dL (ref 6.0–8.3)

## 2011-02-07 LAB — DIFFERENTIAL
Eosinophils Absolute: 0.1 10*3/uL (ref 0.0–0.7)
Eosinophils Relative: 1 % (ref 0–5)
Lymphs Abs: 0.9 10*3/uL (ref 0.7–4.0)
Monocytes Relative: 1 % — ABNORMAL LOW (ref 3–12)

## 2011-06-15 IMAGING — CR DG LUMBAR SPINE COMPLETE 4+V
5 series · 5 of 5 positions shown · non-contrast
Comparison: Acute abdominal series with chest 04/27/2008

CLINICAL DATA: Severe low back pain.

LUMBAR SPINE - COMPLETE 4+ VIEW

[t l-spine a.p.]
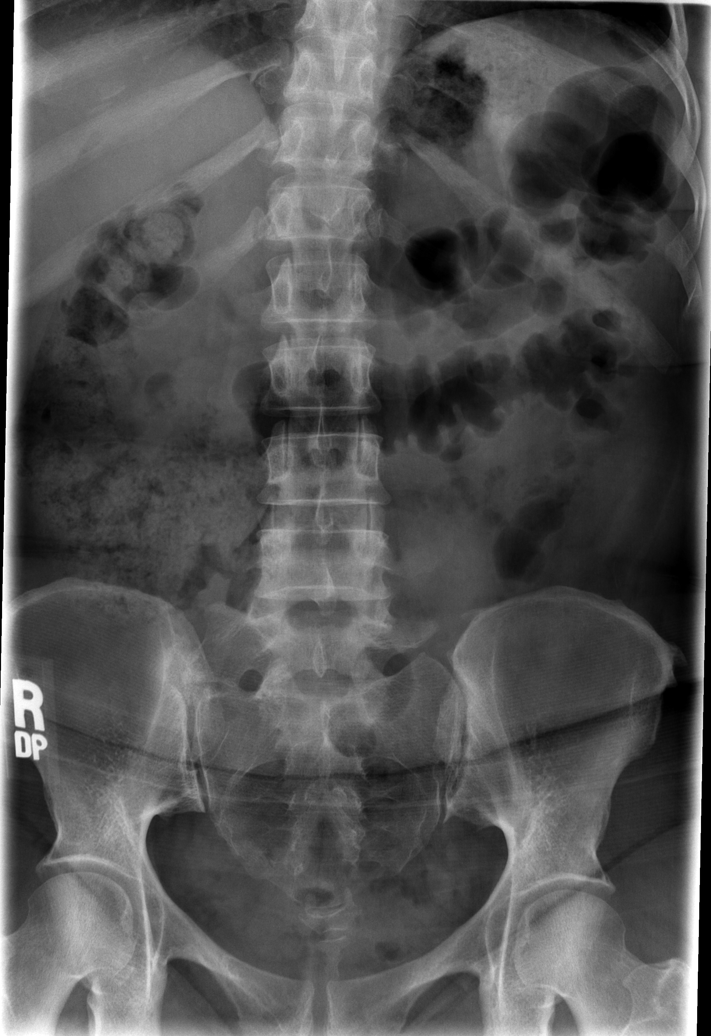

[t l-spine oblique exposure (1 of 2)]
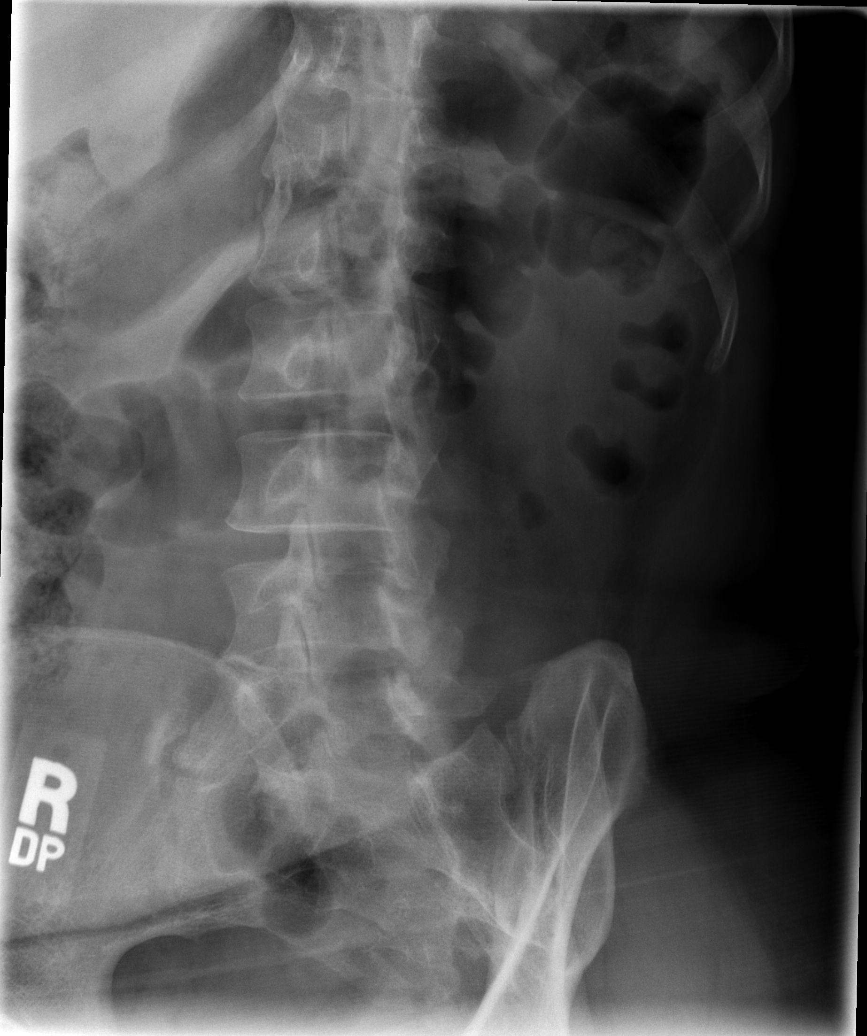

[t l-spine oblique exposure (2 of 2)]
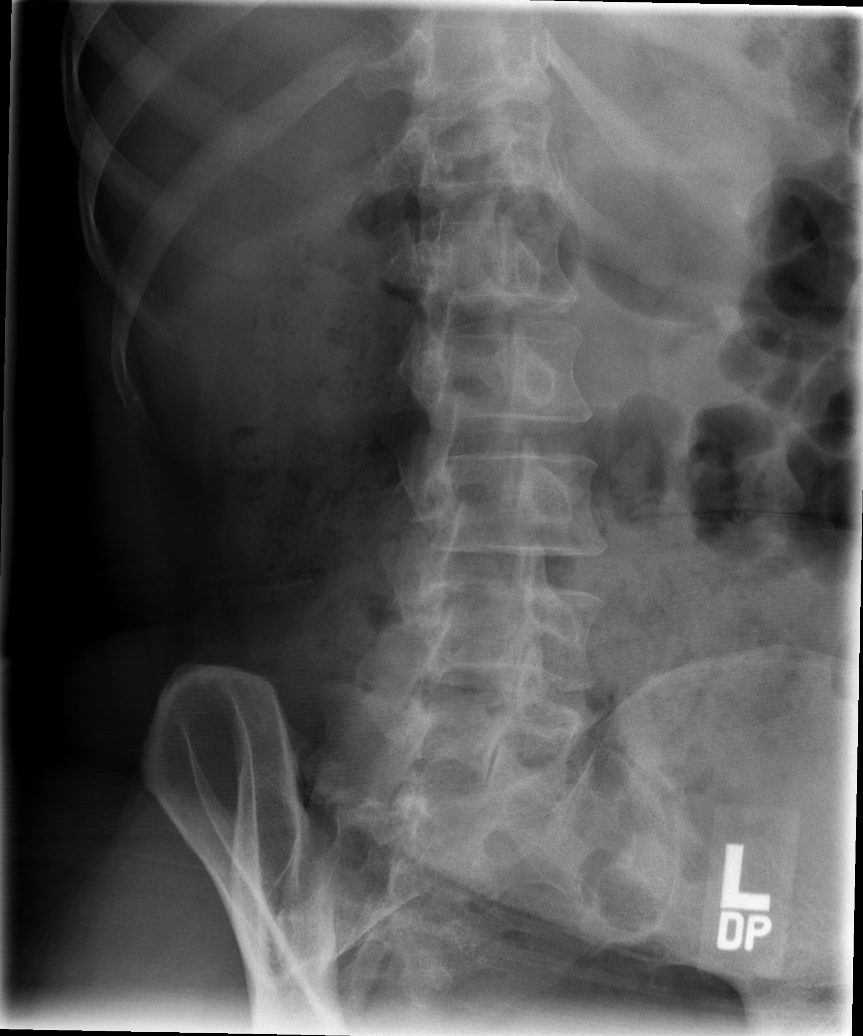

[t l-spine lat]
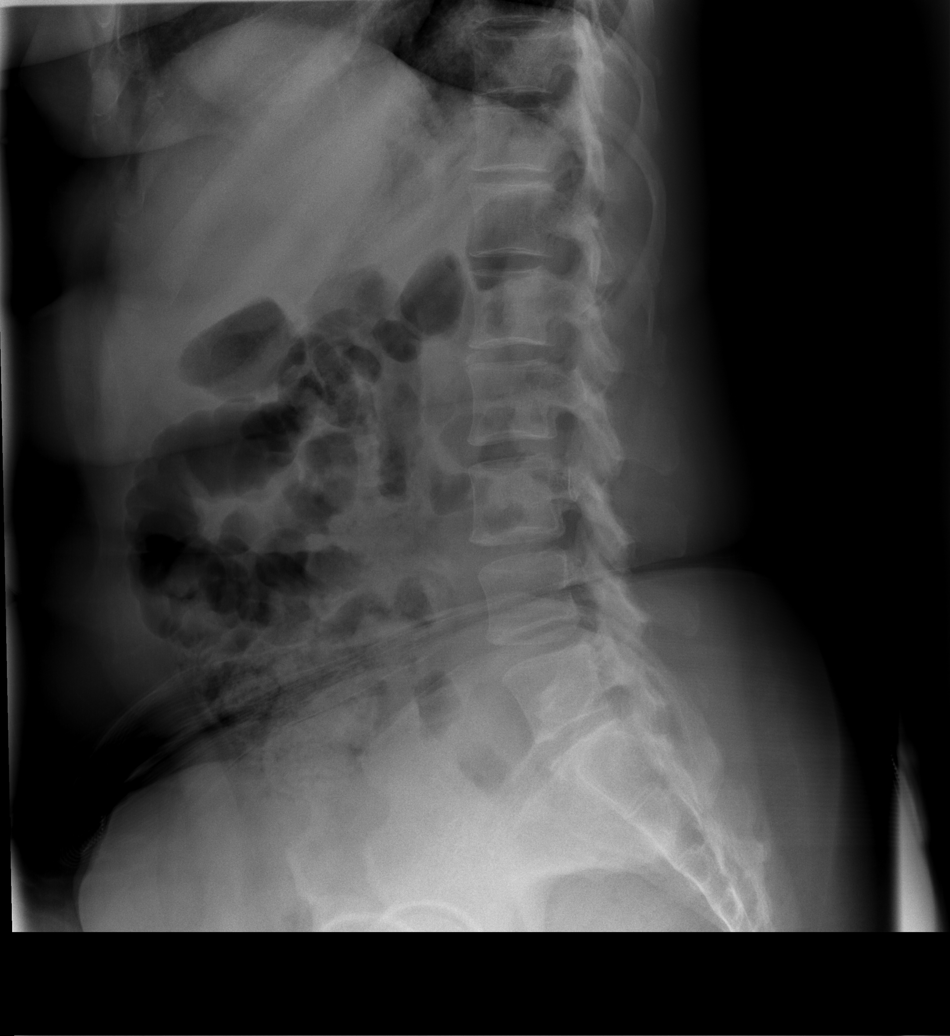

[t l-spine l5-s1 spot]
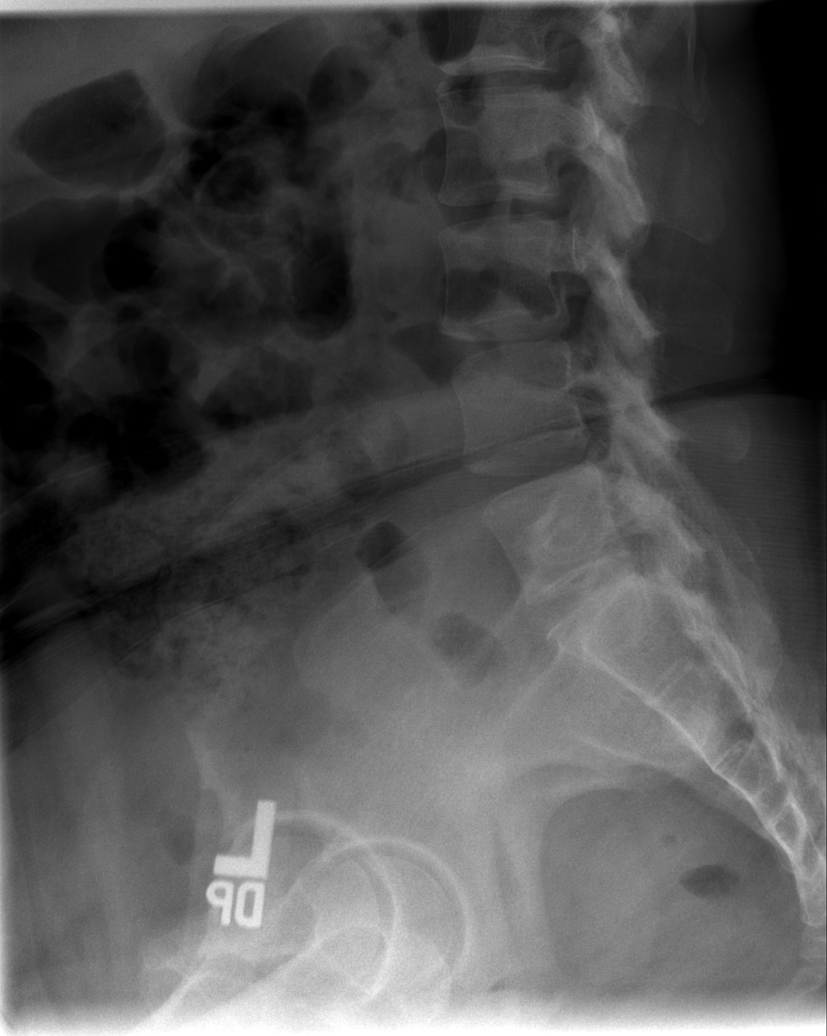

[5 of 5 positions shown; findings below may reference images not displayed]

FINDINGS: There is partial sacralization of L5.  No definite pars
defects.  Alignment is anatomic.  Mild endplate degenerative
changes are seen at L3-4 and L5-S1.  Mild multilevel facet
hypertrophy.
IMPRESSION: Mild spondylosis without acute finding.

## 2011-07-04 HISTORY — PX: EYE SURGERY: SHX253

## 2012-04-23 ENCOUNTER — Encounter: Payer: Self-pay | Admitting: Internal Medicine

## 2012-04-26 ENCOUNTER — Encounter: Payer: Self-pay | Admitting: Internal Medicine

## 2012-04-26 ENCOUNTER — Ambulatory Visit (AMBULATORY_SURGERY_CENTER): Payer: BC Managed Care – PPO | Admitting: *Deleted

## 2012-04-26 VITALS — Ht 63.0 in | Wt 177.8 lb

## 2012-04-26 DIAGNOSIS — Z1211 Encounter for screening for malignant neoplasm of colon: Secondary | ICD-10-CM

## 2012-04-26 MED ORDER — MOVIPREP 100 G PO SOLR: ORAL | Status: DC

## 2012-04-30 ENCOUNTER — Encounter: Payer: Self-pay | Admitting: Internal Medicine

## 2012-04-30 ENCOUNTER — Ambulatory Visit (AMBULATORY_SURGERY_CENTER): Payer: BC Managed Care – PPO | Admitting: Internal Medicine

## 2012-04-30 VITALS — BP 147/80 | HR 75 | Temp 97.2°F | Resp 20 | Wt 177.0 lb

## 2012-04-30 DIAGNOSIS — D126 Benign neoplasm of colon, unspecified: Secondary | ICD-10-CM

## 2012-04-30 DIAGNOSIS — Z1211 Encounter for screening for malignant neoplasm of colon: Secondary | ICD-10-CM

## 2012-04-30 MED ORDER — SODIUM CHLORIDE 0.9 % IV SOLN: 500.0000 mL | INTRAVENOUS | Status: DC

## 2012-04-30 NOTE — Op Note (Signed)
[facility] [address] [place], [address]   COLONOSCOPY PROCEDURE REPORT  PATIENT: Jessica Mayer, Jessica Mayer  MR#: [id number] BIRTHDATE: March 06, 1961 , 51  yrs. old GENDER: Female ENDOSCOPIST: Roxy Cedar, MD REFERRED NF:AOZHY Docia Chuck, M.D. PROCEDURE DATE:  04/30/2012 PROCEDURE:   Colonoscopy with snare polypectomy    x 2 ASA CLASS:   Class II INDICATIONS:average risk screening. MEDICATIONS: MAC sedation, administered by CRNA and propofol (Diprivan) 350mg  IV  DESCRIPTION OF PROCEDURE:   After the risks benefits and alternatives of the procedure were thoroughly explained, informed consent was obtained.  A digital rectal exam revealed no abnormalities of the rectum.   The Fuse-Demo-Scope  endoscope was introduced through the anus and advanced to the cecum, which was identified by both the appendix and ileocecal valve. No adverse events experienced.   The quality of the prep was good, using MoviPrep  The instrument was then slowly withdrawn as the colon was fully examined.      COLON FINDINGS: Two diminutive polyps were found at the cecum and in the ascending colon.  A polypectomy was performed with a cold snare.  The resection was complete and the polyp tissue was completely retrieved.   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   The colon mucosa was otherwise normal. Images taken but only available in hard copy form that will be scanned into [employer]).  Retroflexed views revealed no abnormalities. The time to cecum=3 minutes 51 seconds.  Withdrawal time=18 minutes 08 seconds.  The scope was withdrawn and the procedure completed. COMPLICATIONS: There were no complications.  ENDOSCOPIC IMPRESSION: 1.   Two diminutive polyps were found at the cecum and in the ascending colon; polypectomy was performed with a cold snare 2.   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   The colon mucosa was otherwise  normal  RECOMMENDATIONS: 1. Repeat colonoscopy in 5 years if polyp adenomatous; otherwise 10 years   eSigned:  Roxy Cedar, MD 04/30/2012 2:54 PM   cc: Zelphia Cairo MD and The Patient

## 2012-04-30 NOTE — Progress Notes (Signed)
Patient did not experience any of the following events: a burn prior to discharge; a fall within the facility; wrong site/side/patient/procedure/implant event; or a hospital transfer or hospital admission upon discharge from the facility. (G8907) Patient did not have preoperative order for IV antibiotic SSI prophylaxis. (G8918)  

## 2012-04-30 NOTE — Progress Notes (Signed)
Called to room to assist during endoscopic procedure.  Patient ID and intended procedure confirmed with present staff. Received instructions for my participation in the procedure from the performing physician.  

## 2012-04-30 NOTE — Progress Notes (Addendum)
Jessica Mayer, Fuse demo.representative in room during procedure.

## 2012-04-30 NOTE — Patient Instructions (Addendum)
YOU HAD AN ENDOSCOPIC PROCEDURE TODAY AT THE [facility]: Refer to the procedure report that was given to you for any specific questions about what was found during the examination.  If the procedure report does not answer your questions, please call your gastroenterologist to clarify.  If you requested that your care partner not be given the details of your procedure findings, then the procedure report has been included in a sealed envelope for you to review at your convenience later.  YOU SHOULD EXPECT: Some feelings of bloating in the abdomen. Passage of more gas than usual.  Walking can help get rid of the air that was put into your GI tract during the procedure and reduce the bloating. If you had a lower endoscopy (such as a colonoscopy or flexible sigmoidoscopy) you may notice spotting of blood in your stool or on the toilet paper. If you underwent a bowel prep for your procedure, then you may not have a normal bowel movement for a few days.  DIET: Your first meal following the procedure should be a light meal and then it is ok to progress to your normal diet.  A half-sandwich or bowl of soup is an example of a good first meal.  Heavy or fried foods are harder to digest and may make you feel nauseous or bloated.  Likewise meals heavy in dairy and vegetables can cause extra gas to form and this can also increase the bloating.  Drink plenty of fluids but you should avoid alcoholic beverages for 24 hours.  ACTIVITY: Your care partner should take you home directly after the procedure.  You should plan to take it easy, moving slowly for the rest of the day.  You can resume normal activity the day after the procedure however you should NOT DRIVE or use heavy machinery for 24 hours (because of the sedation medicines used during the test).    SYMPTOMS TO REPORT IMMEDIATELY: A gastroenterologist can be reached at any hour.  During normal business hours, 8:30 AM to 5:00 PM Monday through Friday,  call [phone].  After hours and on weekends, please call the GI answering service at [phone] who will take a message and have the physician on call contact you.   Following lower endoscopy (colonoscopy or flexible sigmoidoscopy):  Excessive amounts of blood in the stool  Significant tenderness or worsening of abdominal pains  Swelling of the abdomen that is new, acute  Fever of 100F or higher    FOLLOW UP: If any biopsies were taken you will be contacted by phone or by letter within the next 1-3 weeks.  Call your gastroenterologist if you have not heard about the biopsies in 3 weeks.  Our staff will call the home number listed on your records the next business day following your procedure to check on you and address any questions or concerns that you may have at that time regarding the information given to you following your procedure. This is a courtesy call and so if there is no answer at the home number and we have not heard from you through the emergency physician on call, we will assume that you have returned to your regular daily activities without incident.  SIGNATURES/CONFIDENTIALITY: You and/or your care partner have signed paperwork which will be entered into your electronic medical record.  These signatures attest to the fact that that the information above on your After Visit Summary has been reviewed and is understood.  Full responsibility of the confidentiality   of this discharge information lies with you and/or your care-partner.   Polyp information given.  Next colonoscopy in 5 years-2018

## 2012-05-03 ENCOUNTER — Telehealth: Payer: Self-pay | Admitting: *Deleted

## 2012-05-03 NOTE — Telephone Encounter (Signed)
Number identifier, left message, follow-up  

## 2012-05-06 ENCOUNTER — Encounter: Payer: Self-pay | Admitting: Internal Medicine

## 2017-05-06 ENCOUNTER — Encounter: Payer: Self-pay | Admitting: Internal Medicine

## 2019-01-11 ENCOUNTER — Inpatient Hospital Stay (HOSPITAL_COMMUNITY): Payer: Medicaid Other

## 2019-01-11 ENCOUNTER — Other Ambulatory Visit: Payer: Self-pay

## 2019-01-11 ENCOUNTER — Inpatient Hospital Stay (HOSPITAL_COMMUNITY)
Admission: EM | Admit: 2019-01-11 | Discharge: 2019-02-03 | DRG: 020 | Disposition: E | Payer: Medicaid Other | Attending: Neurosurgery | Admitting: Neurosurgery

## 2019-01-11 ENCOUNTER — Emergency Department (HOSPITAL_COMMUNITY): Payer: Medicaid Other

## 2019-01-11 DIAGNOSIS — G919 Hydrocephalus, unspecified: Secondary | ICD-10-CM | POA: Diagnosis present

## 2019-01-11 DIAGNOSIS — H409 Unspecified glaucoma: Secondary | ICD-10-CM | POA: Diagnosis present

## 2019-01-11 DIAGNOSIS — I6011 Nontraumatic subarachnoid hemorrhage from right middle cerebral artery: Principal | ICD-10-CM | POA: Diagnosis present

## 2019-01-11 DIAGNOSIS — G40101 Localization-related (focal) (partial) symptomatic epilepsy and epileptic syndromes with simple partial seizures, not intractable, with status epilepticus: Secondary | ICD-10-CM | POA: Diagnosis not present

## 2019-01-11 DIAGNOSIS — Y95 Nosocomial condition: Secondary | ICD-10-CM | POA: Diagnosis not present

## 2019-01-11 DIAGNOSIS — R579 Shock, unspecified: Secondary | ICD-10-CM | POA: Diagnosis not present

## 2019-01-11 DIAGNOSIS — G935 Compression of brain: Secondary | ICD-10-CM | POA: Diagnosis present

## 2019-01-11 DIAGNOSIS — R29810 Facial weakness: Secondary | ICD-10-CM | POA: Diagnosis present

## 2019-01-11 DIAGNOSIS — I161 Hypertensive emergency: Secondary | ICD-10-CM | POA: Diagnosis present

## 2019-01-11 DIAGNOSIS — J156 Pneumonia due to other aerobic Gram-negative bacteria: Secondary | ICD-10-CM | POA: Diagnosis not present

## 2019-01-11 DIAGNOSIS — J690 Pneumonitis due to inhalation of food and vomit: Secondary | ICD-10-CM | POA: Diagnosis not present

## 2019-01-11 DIAGNOSIS — I454 Nonspecific intraventricular block: Secondary | ICD-10-CM | POA: Diagnosis present

## 2019-01-11 DIAGNOSIS — I67848 Other cerebrovascular vasospasm and vasoconstriction: Secondary | ICD-10-CM | POA: Diagnosis not present

## 2019-01-11 DIAGNOSIS — Z4659 Encounter for fitting and adjustment of other gastrointestinal appliance and device: Secondary | ICD-10-CM

## 2019-01-11 DIAGNOSIS — G8929 Other chronic pain: Secondary | ICD-10-CM | POA: Diagnosis present

## 2019-01-11 DIAGNOSIS — R34 Anuria and oliguria: Secondary | ICD-10-CM | POA: Diagnosis not present

## 2019-01-11 DIAGNOSIS — Z981 Arthrodesis status: Secondary | ICD-10-CM

## 2019-01-11 DIAGNOSIS — J15211 Pneumonia due to Methicillin susceptible Staphylococcus aureus: Secondary | ICD-10-CM | POA: Diagnosis not present

## 2019-01-11 DIAGNOSIS — I739 Peripheral vascular disease, unspecified: Secondary | ICD-10-CM

## 2019-01-11 DIAGNOSIS — R197 Diarrhea, unspecified: Secondary | ICD-10-CM | POA: Diagnosis not present

## 2019-01-11 DIAGNOSIS — Z20828 Contact with and (suspected) exposure to other viral communicable diseases: Secondary | ICD-10-CM | POA: Diagnosis present

## 2019-01-11 DIAGNOSIS — J9601 Acute respiratory failure with hypoxia: Secondary | ICD-10-CM | POA: Diagnosis not present

## 2019-01-11 DIAGNOSIS — G8194 Hemiplegia, unspecified affecting left nondominant side: Secondary | ICD-10-CM | POA: Diagnosis present

## 2019-01-11 DIAGNOSIS — R0902 Hypoxemia: Secondary | ICD-10-CM

## 2019-01-11 DIAGNOSIS — Z7951 Long term (current) use of inhaled steroids: Secondary | ICD-10-CM

## 2019-01-11 DIAGNOSIS — D6489 Other specified anemias: Secondary | ICD-10-CM | POA: Diagnosis not present

## 2019-01-11 DIAGNOSIS — D638 Anemia in other chronic diseases classified elsewhere: Secondary | ICD-10-CM | POA: Diagnosis present

## 2019-01-11 DIAGNOSIS — G936 Cerebral edema: Secondary | ICD-10-CM | POA: Diagnosis not present

## 2019-01-11 DIAGNOSIS — J95851 Ventilator associated pneumonia: Secondary | ICD-10-CM | POA: Diagnosis not present

## 2019-01-11 DIAGNOSIS — M5136 Other intervertebral disc degeneration, lumbar region: Secondary | ICD-10-CM | POA: Diagnosis present

## 2019-01-11 DIAGNOSIS — I639 Cerebral infarction, unspecified: Secondary | ICD-10-CM | POA: Diagnosis not present

## 2019-01-11 DIAGNOSIS — Z1619 Resistance to other specified beta lactam antibiotics: Secondary | ICD-10-CM | POA: Diagnosis not present

## 2019-01-11 DIAGNOSIS — Z66 Do not resuscitate: Secondary | ICD-10-CM | POA: Diagnosis not present

## 2019-01-11 DIAGNOSIS — Z79899 Other long term (current) drug therapy: Secondary | ICD-10-CM

## 2019-01-11 DIAGNOSIS — Z9114 Patient's other noncompliance with medication regimen: Secondary | ICD-10-CM

## 2019-01-11 DIAGNOSIS — Z79891 Long term (current) use of opiate analgesic: Secondary | ICD-10-CM

## 2019-01-11 DIAGNOSIS — F411 Generalized anxiety disorder: Secondary | ICD-10-CM | POA: Diagnosis present

## 2019-01-11 DIAGNOSIS — I674 Hypertensive encephalopathy: Secondary | ICD-10-CM | POA: Diagnosis present

## 2019-01-11 DIAGNOSIS — Z6832 Body mass index (BMI) 32.0-32.9, adult: Secondary | ICD-10-CM

## 2019-01-11 DIAGNOSIS — I609 Nontraumatic subarachnoid hemorrhage, unspecified: Secondary | ICD-10-CM

## 2019-01-11 DIAGNOSIS — I119 Hypertensive heart disease without heart failure: Secondary | ICD-10-CM | POA: Diagnosis present

## 2019-01-11 DIAGNOSIS — R4182 Altered mental status, unspecified: Secondary | ICD-10-CM | POA: Diagnosis present

## 2019-01-11 DIAGNOSIS — E232 Diabetes insipidus: Secondary | ICD-10-CM | POA: Diagnosis not present

## 2019-01-11 DIAGNOSIS — I619 Nontraumatic intracerebral hemorrhage, unspecified: Secondary | ICD-10-CM

## 2019-01-11 DIAGNOSIS — Z452 Encounter for adjustment and management of vascular access device: Secondary | ICD-10-CM

## 2019-01-11 DIAGNOSIS — G9389 Other specified disorders of brain: Secondary | ICD-10-CM | POA: Diagnosis not present

## 2019-01-11 DIAGNOSIS — E669 Obesity, unspecified: Secondary | ICD-10-CM | POA: Diagnosis present

## 2019-01-11 DIAGNOSIS — Z9119 Patient's noncompliance with other medical treatment and regimen: Secondary | ICD-10-CM

## 2019-01-11 DIAGNOSIS — J45909 Unspecified asthma, uncomplicated: Secondary | ICD-10-CM | POA: Diagnosis present

## 2019-01-11 DIAGNOSIS — R069 Unspecified abnormalities of breathing: Secondary | ICD-10-CM

## 2019-01-11 DIAGNOSIS — J9690 Respiratory failure, unspecified, unspecified whether with hypoxia or hypercapnia: Secondary | ICD-10-CM

## 2019-01-11 DIAGNOSIS — Z886 Allergy status to analgesic agent status: Secondary | ICD-10-CM

## 2019-01-11 LAB — LIPID PANEL
Cholesterol: 165 mg/dL (ref 0–200)
HDL: 57 mg/dL (ref >40)
LDL Cholesterol: 96 mg/dL (ref 0–99)
Total CHOL/HDL Ratio: 2.9 RATIO
Triglycerides: 59 mg/dL (ref <150)
VLDL: 12 mg/dL (ref 0–40)

## 2019-01-11 LAB — URINALYSIS, ROUTINE W REFLEX MICROSCOPIC
Bilirubin Urine: NEGATIVE
Glucose, UA: NEGATIVE mg/dL
Ketones, ur: 5 mg/dL — AB
Nitrite: NEGATIVE
Protein, ur: 100 mg/dL — AB
Specific Gravity, Urine: 1.033 — ABNORMAL HIGH (ref 1.005–1.030)
pH: 5 (ref 5.0–8.0)

## 2019-01-11 LAB — COMPREHENSIVE METABOLIC PANEL
ALT: 26 U/L (ref 0–44)
AST: 67 U/L — ABNORMAL HIGH (ref 15–41)
Albumin: 4.1 g/dL (ref 3.5–5.0)
Alkaline Phosphatase: 65 U/L (ref 38–126)
Anion gap: 15 (ref 5–15)
BUN: 13 mg/dL (ref 6–20)
CO2: 22 mmol/L (ref 22–32)
Calcium: 9.7 mg/dL (ref 8.9–10.3)
Chloride: 104 mmol/L (ref 98–111)
Creatinine, Ser: 0.93 mg/dL (ref 0.44–1.00)
GFR calc Af Amer: >60 mL/min (ref >60)
GFR calc non Af Amer: >60 mL/min (ref >60)
Glucose, Bld: 123 mg/dL — ABNORMAL HIGH (ref 70–99)
Potassium: 2.9 mmol/L — ABNORMAL LOW (ref 3.5–5.1)
Sodium: 141 mmol/L (ref 135–145)
Total Bilirubin: 0.7 mg/dL (ref 0.3–1.2)
Total Protein: 8.2 g/dL — ABNORMAL HIGH (ref 6.5–8.1)

## 2019-01-11 LAB — CBC WITH DIFFERENTIAL/PLATELET
Abs Immature Granulocytes: 0.05 10*3/uL (ref 0.00–0.07)
Basophils Absolute: 0 10*3/uL (ref 0.0–0.1)
Basophils Relative: 0 %
Eosinophils Absolute: 0 10*3/uL (ref 0.0–0.5)
Eosinophils Relative: 0 %
HCT: 40.4 % (ref 36.0–46.0)
Hemoglobin: 13.5 g/dL (ref 12.0–15.0)
Immature Granulocytes: 0 %
Lymphocytes Relative: 7 %
Lymphs Abs: 1.2 10*3/uL (ref 0.7–4.0)
MCH: 27.1 pg (ref 26.0–34.0)
MCHC: 33.4 g/dL (ref 30.0–36.0)
MCV: 81 fL (ref 80.0–100.0)
Monocytes Absolute: 0.9 10*3/uL (ref 0.1–1.0)
Monocytes Relative: 5 %
Neutro Abs: 14.2 10*3/uL — ABNORMAL HIGH (ref 1.7–7.7)
Neutrophils Relative %: 88 %
Platelets: 233 10*3/uL (ref 150–400)
RBC: 4.99 MIL/uL (ref 3.87–5.11)
RDW: 13 % (ref 11.5–15.5)
WBC: 16.4 10*3/uL — ABNORMAL HIGH (ref 4.0–10.5)
nRBC: 0 % (ref 0.0–0.2)

## 2019-01-11 LAB — RAPID URINE DRUG SCREEN, HOSP PERFORMED
Amphetamines: NOT DETECTED
Barbiturates: NOT DETECTED
Benzodiazepines: NOT DETECTED
Cocaine: NOT DETECTED
Opiates: NOT DETECTED
Tetrahydrocannabinol: NOT DETECTED

## 2019-01-11 LAB — CK: Total CK: 2624 U/L — ABNORMAL HIGH (ref 38–234)

## 2019-01-11 LAB — CBG MONITORING, ED: Glucose-Capillary: 103 mg/dL — ABNORMAL HIGH (ref 70–99)

## 2019-01-11 LAB — LACTIC ACID, PLASMA
Lactic Acid, Venous: 1.5 mmol/L (ref 0.5–1.9)
Lactic Acid, Venous: 1.9 mmol/L (ref 0.5–1.9)

## 2019-01-11 LAB — PROTIME-INR
INR: 1.1 (ref 0.8–1.2)
Prothrombin Time: 14.2 seconds (ref 11.4–15.2)

## 2019-01-11 LAB — SARS CORONAVIRUS 2 BY RT PCR (HOSPITAL ORDER, PERFORMED IN ~~LOC~~ HOSPITAL LAB): SARS Coronavirus 2: NEGATIVE

## 2019-01-11 LAB — AMMONIA: Ammonia: 32 umol/L (ref 9–35)

## 2019-01-11 LAB — SODIUM: Sodium: 136 mmol/L (ref 135–145)

## 2019-01-11 LAB — ETHANOL: Alcohol, Ethyl (B): <10 mg/dL (ref <10)

## 2019-01-11 MED ORDER — STROKE: EARLY STAGES OF RECOVERY BOOK
Freq: Once | Status: AC
  Administered 2019-01-11: 21:00:00
  Filled 2019-01-11: qty 1

## 2019-01-11 MED ORDER — ORAL CARE MOUTH RINSE
15.0000 mL | Freq: Two times a day (BID) | OROMUCOSAL | Status: DC
  Administered 2019-01-12 – 2019-01-14 (×4): 15 mL via OROMUCOSAL

## 2019-01-11 MED ORDER — ACETAMINOPHEN 160 MG/5ML PO SOLN
650.0000 mg | ORAL | Status: DC | PRN
  Administered 2019-01-13 – 2019-01-19 (×21): 650 mg
  Filled 2019-01-11 (×21): qty 20.3

## 2019-01-11 MED ORDER — IOHEXOL 350 MG/ML SOLN
75.0000 mL | Freq: Once | INTRAVENOUS | Status: AC | PRN
  Administered 2019-01-11: 20:00:00 75 mL via INTRAVENOUS

## 2019-01-11 MED ORDER — SODIUM CHLORIDE 0.9 % IV SOLN
INTRAVENOUS | Status: DC
  Administered 2019-01-11 – 2019-01-13 (×4): via INTRAVENOUS

## 2019-01-11 MED ORDER — CHLORHEXIDINE GLUCONATE 0.12 % MT SOLN
15.0000 mL | Freq: Two times a day (BID) | OROMUCOSAL | Status: DC
  Administered 2019-01-11 – 2019-01-14 (×5): 15 mL via OROMUCOSAL
  Filled 2019-01-11 (×4): qty 15

## 2019-01-11 MED ORDER — SENNOSIDES-DOCUSATE SODIUM 8.6-50 MG PO TABS
1.0000 | ORAL_TABLET | Freq: Two times a day (BID) | ORAL | Status: DC
  Filled 2019-01-11: qty 1

## 2019-01-11 MED ORDER — CLEVIDIPINE BUTYRATE 0.5 MG/ML IV EMUL
0.0000 mg/h | INTRAVENOUS | Status: DC
  Administered 2019-01-11: 22:00:00 5 mg/h via INTRAVENOUS
  Administered 2019-01-11: 8 mg/h via INTRAVENOUS
  Administered 2019-01-12: 04:00:00 12 mg/h via INTRAVENOUS
  Administered 2019-01-12: 07:00:00 13 mg/h via INTRAVENOUS
  Administered 2019-01-12: 17:00:00 16 mg/h via INTRAVENOUS
  Administered 2019-01-12 (×2): 21 mg/h via INTRAVENOUS
  Administered 2019-01-12: 03:00:00 11 mg/h via INTRAVENOUS
  Administered 2019-01-12: 08:00:00 14 mg/h via INTRAVENOUS
  Administered 2019-01-13 (×3): 21 mg/h via INTRAVENOUS
  Administered 2019-01-13: 14:00:00 10 mg/h via INTRAVENOUS
  Administered 2019-01-13: 03:00:00 21 mg/h via INTRAVENOUS
  Administered 2019-01-13: 13 mg/h via INTRAVENOUS
  Administered 2019-01-13: 17 mg/h via INTRAVENOUS
  Administered 2019-01-14: 09:00:00 13 mg/h via INTRAVENOUS
  Administered 2019-01-14 (×3): 19 mg/h via INTRAVENOUS
  Filled 2019-01-11 (×11): qty 50
  Filled 2019-01-11: qty 100
  Filled 2019-01-11 (×5): qty 50

## 2019-01-11 MED ORDER — LABETALOL HCL 5 MG/ML IV SOLN
20.0000 mg | Freq: Once | INTRAVENOUS | Status: AC
  Administered 2019-01-11: 19:00:00 20 mg via INTRAVENOUS
  Filled 2019-01-11: qty 4

## 2019-01-11 MED ORDER — NIMODIPINE 6 MG/ML PO SOLN
60.0000 mg | ORAL | Status: DC
  Administered 2019-01-11 – 2019-01-16 (×27): 60 mg
  Filled 2019-01-11 (×26): qty 10

## 2019-01-11 MED ORDER — SENNOSIDES 8.8 MG/5ML PO SYRP
5.0000 mL | ORAL_SOLUTION | Freq: Two times a day (BID) | ORAL | Status: DC
  Administered 2019-01-11 – 2019-01-22 (×10): 5 mL
  Filled 2019-01-11 (×13): qty 5

## 2019-01-11 MED ORDER — LABETALOL HCL 5 MG/ML IV SOLN
20.0000 mg | Freq: Once | INTRAVENOUS | Status: AC
  Administered 2019-01-12: 20:00:00 20 mg via INTRAVENOUS
  Filled 2019-01-11: qty 4

## 2019-01-11 MED ORDER — POTASSIUM CHLORIDE 10 MEQ/100ML IV SOLN
10.0000 meq | Freq: Once | INTRAVENOUS | Status: AC
  Administered 2019-01-11: 10 meq via INTRAVENOUS
  Filled 2019-01-11: qty 100

## 2019-01-11 MED ORDER — POTASSIUM CHLORIDE 10 MEQ/100ML IV SOLN
10.0000 meq | INTRAVENOUS | Status: AC
  Administered 2019-01-11: 10 meq via INTRAVENOUS
  Filled 2019-01-11: qty 100

## 2019-01-11 MED ORDER — STROKE: EARLY STAGES OF RECOVERY BOOK
Freq: Once | Status: AC
  Administered 2019-01-12: 20:00:00
  Filled 2019-01-11: qty 1

## 2019-01-11 MED ORDER — SODIUM CHLORIDE 3 % IV SOLN
INTRAVENOUS | Status: DC
  Administered 2019-01-11 – 2019-01-12 (×2): 75 mL/h via INTRAVENOUS
  Filled 2019-01-11 (×5): qty 500

## 2019-01-11 MED ORDER — PANTOPRAZOLE SODIUM 40 MG IV SOLR
40.0000 mg | Freq: Every day | INTRAVENOUS | Status: DC
  Administered 2019-01-11 – 2019-01-21 (×11): 40 mg via INTRAVENOUS
  Filled 2019-01-11 (×11): qty 40

## 2019-01-11 MED ORDER — ACETAMINOPHEN 650 MG RE SUPP: 650.0000 mg | RECTAL | Status: DC | PRN

## 2019-01-11 MED ORDER — CLEVIDIPINE BUTYRATE 0.5 MG/ML IV EMUL
0.0000 mg/h | INTRAVENOUS | Status: DC
  Administered 2019-01-11: 1 mg/h via INTRAVENOUS
  Filled 2019-01-11: qty 50

## 2019-01-11 MED ORDER — ACETAMINOPHEN 325 MG PO TABS: 650.0000 mg | ORAL_TABLET | ORAL | Status: DC | PRN

## 2019-01-11 MED ORDER — CHLORHEXIDINE GLUCONATE CLOTH 2 % EX PADS
6.0000 | MEDICATED_PAD | Freq: Every day | CUTANEOUS | Status: DC
  Administered 2019-01-11 – 2019-01-19 (×6): 6 via TOPICAL

## 2019-01-11 MED ORDER — NIMODIPINE 30 MG PO CAPS
60.0000 mg | ORAL_CAPSULE | ORAL | Status: DC
  Filled 2019-01-11 (×2): qty 2

## 2019-01-11 MED ORDER — SODIUM CHLORIDE 0.9 % IV BOLUS
1000.0000 mL | Freq: Once | INTRAVENOUS | Status: AC
  Administered 2019-01-11: 16:00:00 1000 mL via INTRAVENOUS

## 2019-01-11 NOTE — ED Notes (Signed)
Pt husband at bedside. Pt's husband reports that patient had unwitnessed fall on Sunday, vomited several times after the fall and then did crawl to the bathroom. Husband reports she has not talked or been able to walk since then.

## 2019-01-11 NOTE — ED Notes (Signed)
Per Dr. Malen Gauze maintain systolic below [phone]

## 2019-01-11 NOTE — ED Notes (Signed)
ED TO INPATIENT HANDOFF REPORT  ED Nurse Name and Phone #:  Elmyra Ricks [phone]  S Name/Age/Gender Jessica Mayer 58 y.o. female Room/Bed: 018C/018C  Code Status   Code Status: Full Code  Home/SNF/Other Home Patient oriented to: self Is this baseline? No   Triage Complete: Triage complete  Chief Complaint AMS  Triage Note Pt BIB [facility] for altered mental status. Per EMS patient fell down on Sunday and has been on the floor since then. Pt does live at home with her husband. EMS reports no movement on left side and unresponsive to pain on left side. Reports patient has been responsive to painful stimuli only since their arrival on scene. Pt is responsive to painful stimuli. VSS stable.    Allergies Allergies  Allergen Reactions  . Asa Buff (Mag [Buffered Aspirin] Nausea And Vomiting    Level of Care/Admitting Diagnosis ED Disposition    ED Disposition Condition Comment   Admit  Hospital Area: [facility] [[id number]]  Level of Care: ICU [6]  Covid Evaluation: Confirmed COVID Negative  Diagnosis: ICH (intracerebral hemorrhage) [facility]) [id number]  Admitting Physician: Amie Portland [id number]  Attending Physician: Amie Portland [id number]  Estimated length of stay: > 1 week  Certification:: I certify this patient will need inpatient services for at least 2 midnights  PT Class (Do Not Modify): Inpatient [101]  PT Acc Code (Do Not Modify): Private [1]       B Medical/Surgery History Past Medical History:  Diagnosis Date  . Allergy   . Asthma   . DDD (degenerative disc disease), lumbar   . DDD (degenerative disc disease), lumbar   . Dyslipidemia   . Glaucoma    Past Surgical History:  Procedure Laterality Date  . CARPAL TUNNEL RELEASE    . EYE SURGERY  march 2013   right and left for glaucoma  . lumbar fussion back surgery    . ROTATOR CUFF REPAIR  04/2008   right     A IV Location/Drains/Wounds Patient Lines/Drains/Airways Status   Active  Line/Drains/Airways    Name:   Placement date:   Placement time:   Site:   Days:   Peripheral IV 01/12/2019 Left Antecubital   01/28/2019    1543    Antecubital   less than 1          Intake/Output Last 24 hours No intake or output data in the 24 hours ending 01/22/2019 1902  Labs/Imaging Results for orders placed or performed during the hospital encounter of 01/30/2019 (from the past 48 hour(s))  CBC with Differential     Status: Abnormal   Collection Time: 01/05/2019  3:32 PM  Result Value Ref Range   WBC 16.4 (H) 4.0 - 10.5 K/uL   RBC 4.99 3.87 - 5.11 MIL/uL   Hemoglobin 13.5 12.0 - 15.0 g/dL   HCT 40.4 36.0 - 46.0 %   MCV 81.0 80.0 - 100.0 fL   MCH 27.1 26.0 - 34.0 pg   MCHC 33.4 30.0 - 36.0 g/dL   RDW 13.0 11.5 - 15.5 %   Platelets 233 150 - 400 K/uL   nRBC 0.0 0.0 - 0.2 %   Neutrophils Relative % 88 %   Neutro Abs 14.2 (H) 1.7 - 7.7 K/uL   Lymphocytes Relative 7 %   Lymphs Abs 1.2 0.7 - 4.0 K/uL   Monocytes Relative 5 %   Monocytes Absolute 0.9 0.1 - 1.0 K/uL   Eosinophils Relative 0 %   Eosinophils Absolute 0.0 0.0 -  0.5 K/uL   Basophils Relative 0 %   Basophils Absolute 0.0 0.0 - 0.1 K/uL   Immature Granulocytes 0 %   Abs Immature Granulocytes 0.05 0.00 - 0.07 K/uL    Comment: Performed at [facility], [place] [address] [place], [place] [address]  Comprehensive metabolic panel     Status: Abnormal   Collection Time: 02/02/2019  3:32 PM  Result Value Ref Range   Sodium 141 135 - 145 mmol/L   Potassium 2.9 (L) 3.5 - 5.1 mmol/L   Chloride 104 98 - 111 mmol/L   CO2 22 22 - 32 mmol/L   Glucose, Bld 123 (H) 70 - 99 mg/dL   BUN 13 6 - 20 mg/dL   Creatinine, Ser 0.93 0.44 - 1.00 mg/dL   Calcium 9.7 8.9 - 10.3 mg/dL   Total Protein 8.2 (H) 6.5 - 8.1 g/dL   Albumin 4.1 3.5 - 5.0 g/dL   AST 67 (H) 15 - 41 U/L   ALT 26 0 - 44 U/L   Alkaline Phosphatase 65 38 - 126 U/L   Total Bilirubin 0.7 0.3 - 1.2 mg/dL   GFR calc non Af Amer >60 >60 mL/min   GFR calc Af Amer >60 >60  mL/min   Anion gap 15 5 - 15    Comment: Performed at [facility] [address], [place], [place] [address]  Protime-INR     Status: None   Collection Time: 01/12/2019  3:32 PM  Result Value Ref Range   Prothrombin Time 14.2 11.4 - 15.2 seconds   INR 1.1 0.8 - 1.2    Comment: (NOTE) INR goal varies based on device and disease states. Performed at [facility], [place] [address], [place], [place] [address]   CK     Status: Abnormal   Collection Time: 01/06/2019  3:32 PM  Result Value Ref Range   Total CK 2,624 (H) 38 - 234 U/L    Comment: Performed at [facility], [place] [address] [place], [place] [address]  CBG monitoring, ED     Status: Abnormal   Collection Time: 01/28/2019  3:45 PM  Result Value Ref Range   Glucose-Capillary 103 (H) 70 - 99 mg/dL  SARS Coronavirus 2 [facility] order, Performed in [facility] hospital lab) Nasopharyngeal Nasopharyngeal Swab     Status: None   Collection Time: 01/14/2019  3:50 PM   Specimen: Nasopharyngeal Swab  Result Value Ref Range   SARS Coronavirus 2 NEGATIVE NEGATIVE    Comment: (NOTE) If result is NEGATIVE SARS-CoV-2 target nucleic acids are NOT DETECTED. The SARS-CoV-2 RNA is generally detectable in upper and lower  respiratory specimens during the acute phase of infection. The lowest  concentration of SARS-CoV-2 viral copies this assay can detect is 250  copies / mL. A negative result does not preclude SARS-CoV-2 infection  and should not be used as the sole basis for treatment or other  patient management decisions.  A negative result may occur with  improper specimen collection / handling, submission of specimen other  than nasopharyngeal swab, presence of viral mutation(s) within the  areas targeted by this assay, and inadequate number of viral copies  (<250 copies / mL). A negative result must be combined with clinical  observations, patient history, and epidemiological information. If result is POSITIVE SARS-CoV-2  target nucleic acids are DETECTED. The SARS-CoV-2 RNA is generally detectable in upper and lower  respiratory specimens dur ing the acute phase of infection.  Positive  results are indicative of active infection with SARS-CoV-2.  Clinical  correlation with patient history and other diagnostic information is  necessary to determine patient infection status.  Positive results do  not rule out bacterial infection or co-infection with other viruses. If result is PRESUMPTIVE POSTIVE SARS-CoV-2 nucleic acids MAY BE PRESENT.   A presumptive positive result was obtained on the submitted specimen  and confirmed on repeat testing.  While 2019 novel coronavirus  (SARS-CoV-2) nucleic acids may be present in the submitted sample  additional confirmatory testing may be necessary for epidemiological  and / or clinical management purposes  to differentiate between  SARS-CoV-2 and other Sarbecovirus currently known to infect humans.  If clinically indicated additional testing with an alternate test  methodology [phone]) is advised. The SARS-CoV-2 RNA is generally  detectable in upper and lower respiratory sp ecimens during the acute  phase of infection. The expected result is Negative. Fact Sheet for Patients:  StrictlyIdeas.no Fact Sheet for Healthcare Providers: BankingDealers.co.za This test is not yet approved or cleared by the [place] FDA and has been authorized for detection and/or diagnosis of SARS-CoV-2 by FDA under an Emergency Use Authorization (EUA).  This EUA will remain in effect (meaning this test can be used) for the duration of the COVID-19 declaration under Section 564(b)(1) of the Act, 21 U.S.C. section 360bbb-3(b)(1), unless the authorization is terminated or revoked sooner. Performed at [facility], [place] [address] [place], [place] [address]   Lactic acid, plasma     Status: None   Collection Time: 01/25/2019  4:00 PM   Result Value Ref Range   Lactic Acid, Venous 1.5 0.5 - 1.9 mmol/L    Comment: Performed at [facility] [address], [place], [place] [address]  Ammonia     Status: None   Collection Time: 01/24/2019  4:00 PM  Result Value Ref Range   Ammonia 32 9 - 35 umol/L    Comment: Performed at [facility], [place] [address] [place], [place] [address]  Ethanol     Status: None   Collection Time: 01/31/2019  4:00 PM  Result Value Ref Range   Alcohol, Ethyl (B) <10 <10 mg/dL    Comment: (NOTE) Lowest detectable limit for serum alcohol is 10 mg/dL. For medical purposes only. Performed at [facility], [place] [address], [place], [place] [address]    Ct Head Wo Contrast  Result Date: 01/25/2019 CLINICAL DATA:  Mental status change. Patient fell 2 days ago and was found down. Left-sided weakness and negligence. EXAM: CT HEAD WITHOUT CONTRAST TECHNIQUE: Contiguous axial images were obtained from the base of the skull through the vertex without intravenous contrast. COMPARISON:  Report only from CT head 12/20/2004. FINDINGS: Brain: There is a large acute intraparenchymal hematoma involving the right temporal and frontal lobes. This measures approximately 5.5 x 3.6 x 4.7 cm (volume = 49 cm^3). There is surrounding vasogenic edema and a small amount subarachnoid hemorrhage in the right sylvian fissure and along right convexity. There is 8 mm of right to left midline shift. There is impending uncal herniation and mild asymmetric dilatation of the left lateral ventricle. Vascular:  No hyperdense vessel identified. Skull: Negative for fracture or focal lesion. Sinuses/Orbits: The visualized paranasal sinuses and mastoid air cells are clear. No orbital abnormalities are seen. Other: None. IMPRESSION: 1. Large intraparenchymal hematoma involving the right frontotemporal lobes with associated local mass effect, midline shift and early dilatation of the left  lateral ventricle. There is some associated  subarachnoid hemorrhage and impending uncal herniation. Emergent neuro surgical consultation recommended. 2. Critical Value/emergent results were called by telephone at the time of interpretation on 01/10/2019 at 5:15 pm to providerJORDAN ROBINSON , who verbally acknowledged these results. Electronically Signed   By: Richardean Sale M.D.   On: 01/14/2019 17:21   Dg Chest Port 1 View  Result Date: 01/04/2019 CLINICAL DATA:  Found down. EXAM: PORTABLE CHEST 1 VIEW COMPARISON:  06/11/2010 FINDINGS: The cardiac silhouette, mediastinal and hilar contours are within normal limits. Mild tortuosity of the thoracic aorta. Patchy E left upper lobe and right basilar atelectasis. No definite infiltrates or effusions. The bony structures are intact. IMPRESSION: Streaky left upper lobe and right basilar atelectasis. Electronically Signed   By: Marijo Sanes M.D.   On: 01/27/2019 16:54    Pending Labs Unresulted Labs (From admission, onward)    Start     Ordered   01/27/2019 1850  Sodium  Now then every 6 hours,   R (with STAT occurrences)     01/31/2019 1850   01/07/2019 1833  Lipid panel  Once,   STAT     01/16/2019 1832   01/21/2019 1832  HIV antibody (Routine Testing)  Once,   STAT     01/12/2019 1832   02/02/2019 1832  Hemoglobin A1c  Once,   STAT     01/06/2019 1832   01/15/2019 1544  Urine rapid drug screen (hosp performed)  ONCE - STAT,   STAT     02/02/2019 1543   01/28/2019 1539  Lactic acid, plasma  Now then every 2 hours,   STAT     01/18/2019 1539   01/14/2019 1520  Urinalysis, Routine w reflex microscopic  ONCE - STAT,   STAT     01/22/2019 1520          Vitals/Pain Today's Vitals   02/01/2019 1615 02/02/2019 1630 01/24/2019 1815 01/14/2019 1845  BP: (!) 164/87 (!) 162/93 (!) 158/115 (!) 176/87  Pulse: 70     Resp: (!) 22 (!) 21 (!) 22 20  Temp:      TempSrc:      SpO2: 98%       Isolation Precautions Airborne and Contact precautions  Medications Medications  potassium chloride 10 mEq in 100 mL IVPB (has no  administration in time range)   stroke: mapping our early stages of recovery book (has no administration in time range)  acetaminophen (TYLENOL) tablet 650 mg (has no administration in time range)    Or  acetaminophen (TYLENOL) solution 650 mg (has no administration in time range)    Or  acetaminophen (TYLENOL) suppository 650 mg (has no administration in time range)  senna-docusate (Senokot-S) tablet 1 tablet (has no administration in time range)  pantoprazole (PROTONIX) injection 40 mg (has no administration in time range)  labetalol (NORMODYNE) injection 20 mg (20 mg Intravenous Given 02/02/2019 1854)    And  clevidipine (CLEVIPREX) infusion 0.5 mg/mL (has no administration in time range)  sodium chloride (hypertonic) 3 % solution (has no administration in time range)  sodium chloride 0.9 % bolus 1,000 mL (1,000 mLs Intravenous New Bag/Given 01/24/2019 1603)    Mobility non-ambulatory High fall risk   Focused Assessments Neuro Assessment Handoff:  Swallow screen Mayer? No  Cardiac Rhythm: Sinus tachycardia, Normal sinus rhythm NIH Stroke Scale ( + Modified Stroke Scale Criteria)  Interval: Initial Level of Consciousness (1a.)   : Not alert, requires repeated stimulation to attend, or  is obtunded and requires strong or painful stimulation to make movements (not stereotyped) LOC Questions (1b. )   +: Answers neither question correctly LOC Commands (1c. )   + : Performs neither task correctly Facial Palsy (4. )    : Normal symmetrical movements Motor Arm, Left (5a. )   +: No effort against gravity Motor Arm, Right (5b. )   +: No effort against gravity Motor Leg, Left (6a. )   +: No effort against gravity Sensory (8. )   +: Mild-to-moderate sensory loss, patient feels pinprick is less sharp or is dull on the affected side, or there is a loss of superficial pain with pinprick, but patient is aware of being touched Best Language (9. )   +: Mute, global aphasia Dysarthria (10. ): Severe  dysarthria, patient's speech is so slurred as to be unintelligible in the absence of or out of proportion to any dysphasia, or is mute/anarthric Last date known well: 01/09/19   Neuro Assessment: Exceptions to [facility] Neuro Checks:   Initial (01/16/2019 1515)  Last Documented NIHSS Modified Score:   Has TPA been given? No If patient is a Neuro Trauma and patient is going to OR before floor call report to [place] nurse: [phone] or [phone]     R Recommendations: See Admitting Provider Note  Report given to:   Additional Notes:

## 2019-01-11 NOTE — ED Notes (Signed)
Patricia-daughter [phone]

## 2019-01-11 NOTE — TOC Initial Note (Signed)
Transition of Care [facility]) - Initial/Assessment Note    Patient Details  Name: Jessica Mayer MRN: [id number] Date of Birth: [birth year]  Transition of Care [facility]) CM/SW Contact:    [place], LCSW Phone Number: 01/04/2019, 7:27 PM  Clinical Narrative:  CSW at bedside to conduct TOC assessment. Pts sister, who was also later identified as a friend was at bedside during time of assessment.   Pts sister/friend reports that prior to being brought into the ED, pt lived at home with her husband. Sister goes into detail and explains that although pt did not drive, she enjoyed going out into the community for fresh air. Sister states that she visits every two weeks on Tuesday to take pt out into the community.   Pt usually walks independently and without assistance with ADLs. Pt does not have PCP nor insurance. Pt is known to be very spiritual and prays often.   Husband also at bedside later during assessment.   CSW awaiting call back from Pts daughter Kemi-Patricia, [place]: [phone]  for further collateral information.               Barriers to Discharge: Continued Medical Work up   Patient Goals and CMS Choice Patient states their goals for this hospitalization and ongoing recovery are:: goals are unclear at this time      Expected Discharge Plan and Services   In-house Referral: Clinical Social Work     Living arrangements for the past 2 months: Single Family Home                                      Prior Living Arrangements/Services Living arrangements for the past 2 months: Single Family Home Lives with:: Spouse Patient language and need for interpreter reviewed:: Yes Do you feel safe going back to the place where you live?: Yes      Need for Family Participation in Patient Care: Yes (Comment) Care giver support system in place?: Yes (comment)   Criminal Activity/Legal Involvement Pertinent to Current Situation/Hospitalization: No - Comment as  needed  Activities of Daily Living      Permission Sought/Granted Permission sought to share information with : Case Manager, Family Supports    Share Information with NAME: Shavona Mazeika     Permission granted to share info w Relationship: Spouse  Permission granted to share info w Contact Information: PH: [phone]  Emotional Assessment Appearance:: Appears stated age Attitude/Demeanor/Rapport: Unable to Assess(Pt lethargic and asleep during time of assessment) Affect (typically observed): Unable to Assess(Pt lethargic and asleep during time of assessment) Orientation: : (unable to assess at this time) Alcohol / Substance Use: Not Applicable Psych Involvement: No (comment)  Admission diagnosis:  AMS Patient Active Problem List   Diagnosis Date Noted  . ICH (intracerebral hemorrhage) ([place]) 01/22/2019  . GENERALIZED ANXIETY DISORDER 06/03/2010  . UNSPECIFIED ESSENTIAL HYPERTENSION 06/03/2010  . HERNIATED LUMBOSACRAL DISC 06/03/2010  . LOW BACK PAIN, CHRONIC 06/03/2010  . LIPOMA 07/02/2006  . MIGRAINE, UNSPEC., W/O INTRACTABLE MIGRAINE 07/02/2006  . ASTHMA, UNSPECIFIED 07/02/2006  . PELVIC PAIN 625.9 07/02/2006   PCP:  Lujean Amel, MD Pharmacy:   [facility] [address] [place] - [address] [place] [address] Phone: [phone] Fax: [phone]     Social Determinants of Health (SDOH) Interventions    Readmission Risk Interventions No flowsheet data found.

## 2019-01-11 NOTE — Consult Note (Signed)
Reason for Consult: Intracranial hemorrhage Referring Physician: Emergency department  Jessica Mayer is an 58 y.o. female.  HPI: 58 year old female with history of declining level of consciousness for more than 48 hours.  Patient was last seen normal sometime Sunday.  Patient brought to the emergency room with decreased level of consciousness and obvious left-sided weakness.  No history of trauma.  Significant history of hypertension for which she is noncompliant with her medication by report.  Past Medical History:  Diagnosis Date  . Allergy   . Asthma   . DDD (degenerative disc disease), lumbar   . DDD (degenerative disc disease), lumbar   . Dyslipidemia   . Glaucoma     Past Surgical History:  Procedure Laterality Date  . CARPAL TUNNEL RELEASE    . EYE SURGERY  march 2013   right and left for glaucoma  . lumbar fussion back surgery    . ROTATOR CUFF REPAIR  04/2008   right    Family History  Problem Relation Age of Onset  . Colon cancer Neg Hx   . Stomach cancer Neg Hx   . Esophageal cancer Neg Hx   . Rectal cancer Neg Hx     Social History:  reports that she has never smoked. She has never used smokeless tobacco. She reports current alcohol use of about 2.0 standard drinks of alcohol per week. She reports that she does not use drugs.  Allergies:  Allergies  Allergen Reactions  . Asa Buff (Mag [Buffered Aspirin] Nausea And Vomiting    Medications: I have reviewed the patient's current medications.  Results for orders placed or performed during the hospital encounter of 01/28/2019 (from the past 48 hour(s))  CBC with Differential     Status: Abnormal   Collection Time: 01/20/2019  3:32 PM  Result Value Ref Range   WBC 16.4 (H) 4.0 - 10.5 K/uL   RBC 4.99 3.87 - 5.11 MIL/uL   Hemoglobin 13.5 12.0 - 15.0 g/dL   HCT 40.4 36.0 - 46.0 %   MCV 81.0 80.0 - 100.0 fL   MCH 27.1 26.0 - 34.0 pg   MCHC 33.4 30.0 - 36.0 g/dL   RDW 13.0 11.5 - 15.5 %   Platelets 233 150  - 400 K/uL   nRBC 0.0 0.0 - 0.2 %   Neutrophils Relative % 88 %   Neutro Abs 14.2 (H) 1.7 - 7.7 K/uL   Lymphocytes Relative 7 %   Lymphs Abs 1.2 0.7 - 4.0 K/uL   Monocytes Relative 5 %   Monocytes Absolute 0.9 0.1 - 1.0 K/uL   Eosinophils Relative 0 %   Eosinophils Absolute 0.0 0.0 - 0.5 K/uL   Basophils Relative 0 %   Basophils Absolute 0.0 0.0 - 0.1 K/uL   Immature Granulocytes 0 %   Abs Immature Granulocytes 0.05 0.00 - 0.07 K/uL    Comment: Performed at [facility], [address], [place], [place] [address]  Comprehensive metabolic panel     Status: Abnormal   Collection Time: 01/08/2019  3:32 PM  Result Value Ref Range   Sodium 141 135 - 145 mmol/L   Potassium 2.9 (L) 3.5 - 5.1 mmol/L   Chloride 104 98 - 111 mmol/L   CO2 22 22 - 32 mmol/L   Glucose, Bld 123 (H) 70 - 99 mg/dL   BUN 13 6 - 20 mg/dL   Creatinine, Ser 0.93 0.44 - 1.00 mg/dL   Calcium 9.7 8.9 - 10.3 mg/dL   Total  Protein 8.2 (H) 6.5 - 8.1 g/dL   Albumin 4.1 3.5 - 5.0 g/dL   AST 67 (H) 15 - 41 U/L   ALT 26 0 - 44 U/L   Alkaline Phosphatase 65 38 - 126 U/L   Total Bilirubin 0.7 0.3 - 1.2 mg/dL   GFR calc non Af Amer >60 >60 mL/min   GFR calc Af Amer >60 >60 mL/min   Anion gap 15 5 - 15    Comment: Performed at [facility] [address], [place], [place] [address]  Protime-INR     Status: None   Collection Time: 01/08/2019  3:32 PM  Result Value Ref Range   Prothrombin Time 14.2 11.4 - 15.2 seconds   INR 1.1 0.8 - 1.2    Comment: (NOTE) INR goal varies based on device and disease states. Performed at [facility], [place] [address] [place], [place] [address]   CK     Status: Abnormal   Collection Time: 01/13/2019  3:32 PM  Result Value Ref Range   Total CK 2,624 (H) 38 - 234 U/L    Comment: Performed at [facility], [place] [address] [place], [place] [address]  CBG monitoring, ED     Status: Abnormal   Collection Time: 01/24/2019  3:45 PM  Result Value Ref Range   Glucose-Capillary  103 (H) 70 - 99 mg/dL  SARS Coronavirus 2 [facility] order, Performed in [facility] hospital lab) Nasopharyngeal Nasopharyngeal Swab     Status: None   Collection Time: 01/28/2019  3:50 PM   Specimen: Nasopharyngeal Swab  Result Value Ref Range   SARS Coronavirus 2 NEGATIVE NEGATIVE    Comment: (NOTE) If result is NEGATIVE SARS-CoV-2 target nucleic acids are NOT DETECTED. The SARS-CoV-2 RNA is generally detectable in upper and lower  respiratory specimens during the acute phase of infection. The lowest  concentration of SARS-CoV-2 viral copies this assay can detect is 250  copies / mL. A negative result does not preclude SARS-CoV-2 infection  and should not be used as the sole basis for treatment or other  patient management decisions.  A negative result may occur with  improper specimen collection / handling, submission of specimen other  than nasopharyngeal swab, presence of viral mutation(s) within the  areas targeted by this assay, and inadequate number of viral copies  (<250 copies / mL). A negative result must be combined with clinical  observations, patient history, and epidemiological information. If result is POSITIVE SARS-CoV-2 target nucleic acids are DETECTED. The SARS-CoV-2 RNA is generally detectable in upper and lower  respiratory specimens dur ing the acute phase of infection.  Positive  results are indicative of active infection with SARS-CoV-2.  Clinical  correlation with patient history and other diagnostic information is  necessary to determine patient infection status.  Positive results do  not rule out bacterial infection or co-infection with other viruses. If result is PRESUMPTIVE POSTIVE SARS-CoV-2 nucleic acids MAY BE PRESENT.   A presumptive positive result was obtained on the submitted specimen  and confirmed on repeat testing.  While 2019 novel coronavirus  (SARS-CoV-2) nucleic acids may be present in the submitted sample  additional confirmatory testing  may be necessary for epidemiological  and / or clinical management purposes  to differentiate between  SARS-CoV-2 and other Sarbecovirus currently known to infect humans.  If clinically indicated additional testing with an alternate test  methodology [phone]) is advised. The SARS-CoV-2 RNA is generally  detectable in upper and  lower respiratory sp ecimens during the acute  phase of infection. The expected result is Negative. Fact Sheet for Patients:  StrictlyIdeas.no Fact Sheet for Healthcare Providers: BankingDealers.co.za This test is not yet approved or cleared by the [place] FDA and has been authorized for detection and/or diagnosis of SARS-CoV-2 by FDA under an Emergency Use Authorization (EUA).  This EUA will remain in effect (meaning this test can be used) for the duration of the COVID-19 declaration under Section 564(b)(1) of the Act, 21 U.S.C. section 360bbb-3(b)(1), unless the authorization is terminated or revoked sooner. Performed at [facility], [place] [address], [place], [place] [address]   Lactic acid, plasma     Status: None   Collection Time: 01/04/2019  4:00 PM  Result Value Ref Range   Lactic Acid, Venous 1.5 0.5 - 1.9 mmol/L    Comment: Performed at [facility] [address], [place], [place] [address]  Ammonia     Status: None   Collection Time: 01/07/2019  4:00 PM  Result Value Ref Range   Ammonia 32 9 - 35 umol/L    Comment: Performed at [facility], [place] [address] [place], [place] [address]  Ethanol     Status: None   Collection Time: 01/23/2019  4:00 PM  Result Value Ref Range   Alcohol, Ethyl (B) <10 <10 mg/dL    Comment: (NOTE) Lowest detectable limit for serum alcohol is 10 mg/dL. For medical purposes only. Performed at [facility], [place] [address] [place], [place] [address]     Ct Head Wo Contrast  Result Date: 01/07/2019 CLINICAL DATA:  Mental status change. Patient  fell 2 days ago and was found down. Left-sided weakness and negligence. EXAM: CT HEAD WITHOUT CONTRAST TECHNIQUE: Contiguous axial images were obtained from the base of the skull through the vertex without intravenous contrast. COMPARISON:  Report only from CT head 12/20/2004. FINDINGS: Brain: There is a large acute intraparenchymal hematoma involving the right temporal and frontal lobes. This measures approximately 5.5 x 3.6 x 4.7 cm (volume = 49 cm^3). There is surrounding vasogenic edema and a small amount subarachnoid hemorrhage in the right sylvian fissure and along right convexity. There is 8 mm of right to left midline shift. There is impending uncal herniation and mild asymmetric dilatation of the left lateral ventricle. Vascular:  No hyperdense vessel identified. Skull: Negative for fracture or focal lesion. Sinuses/Orbits: The visualized paranasal sinuses and mastoid air cells are clear. No orbital abnormalities are seen. Other: None. IMPRESSION: 1. Large intraparenchymal hematoma involving the right frontotemporal lobes with associated local mass effect, midline shift and early dilatation of the left lateral ventricle. There is some associated subarachnoid hemorrhage and impending uncal herniation. Emergent neuro surgical consultation recommended. 2. Critical Value/emergent results were called by telephone at the time of interpretation on 01/15/2019 at 5:15 pm to providerJORDAN ROBINSON , who verbally acknowledged these results. Electronically Signed   By: Richardean Sale M.D.   On: 01/18/2019 17:21   Dg Chest Port 1 View  Result Date: 01/24/2019 CLINICAL DATA:  Found down. EXAM: PORTABLE CHEST 1 VIEW COMPARISON:  06/11/2010 FINDINGS: The cardiac silhouette, mediastinal and hilar contours are within normal limits. Mild tortuosity of the thoracic aorta. Patchy E left upper lobe and right basilar atelectasis. No definite infiltrates or effusions. The bony structures are intact. IMPRESSION: Streaky left  upper lobe and right basilar atelectasis. Electronically Signed   By: Marijo Sanes M.D.   On: 02/01/2019 16:54  Review of systems not obtained due to patient factors. Blood pressure (!) 158/115, pulse 70, temperature (!) 97.3 F (36.3 C), temperature source Axillary, resp. rate (!) 22, last menstrual period 05/29/2010, SpO2 98 %. Patient will open her eyes.  She is restless.  She moans and groans but does not answer questions or speak intelligibly.  She is purposeful with her right upper and lower extremity.  She is plegic with her left upper and lower extremity with increased tone.  Pupils are equal at 4 mm and reactive bilaterally.  Gaze is conjugate.  Significant left-sided facial weakness.  Examination of her head ears eyes nose and throat is unremarked.  Chest and abdomen are benign appearing.  Extremities are free from injury or deformity.  Assessment/Plan: Large right temporal mixed density intracerebral hematoma.  There is a small amount of subarachnoid blood within the sylvian fissure.  I see no obvious aneurysm on the CT scan but I agree with the plan of a CT angiogram to evaluate for possible aneurysm.  Given her history and the appearance of the clot I favor this is likely a lobar hypertensive hemorrhage.  Agree with admission for hypertension control and observation.  No indication for surgical intervention at present.  Cooper Render Lynlee Stratton 01/18/2019, 6:32 PM

## 2019-01-11 NOTE — ED Triage Notes (Signed)
Pt BIB [facility] for altered mental status. Per EMS patient fell down on Sunday and has been on the floor since then. Pt does live at home with her husband. EMS reports no movement on left side and unresponsive to pain on left side. Reports patient has been responsive to painful stimuli only since their arrival on scene. Pt is responsive to painful stimuli. VSS stable.

## 2019-01-11 NOTE — ED Provider Notes (Signed)
[facility] EMERGENCY DEPARTMENT Provider Note   CSN: [id number] Arrival date & time: 01/24/2019  1500     History   Chief Complaint Chief Complaint  Patient presents with   Altered Mental Status    HPI Jessica Mayer is a 58 y.o. female w PMHx HTN, s/p lumbar laminectomy, anxiety, asthma, brought into the ED via EMS from home.  Patient's husband states around 11 PM on Sunday night patient had an unwitnessed fall in the bedroom.  He states she called out for him and when he came to the room she was on the floor.  She states she had fallen though stated she needed to vomit.  She then crawled to the restroom and vomited, however when she returned to her bed she laid down on the floor instead of in the bed.  Her daughter notes this is not unusual for patient to sit or lay on the floor instead of in her bed.  Her husband states that he was having normal conversation with his wife that evening while she was on the floor though states that she seemed a little bit more confused from baseline and may have had some slurring of her speech.  He states from then on she was suspected to be sleeping all day yesterday and today.  He attempted multiple times to wake her, however she was unresponsive.  He states she had urinary incontinence on herself and had not had any p.o. intake since Sunday.  Her husband reports no known recent illness or complaints per patient other than her chronic low back pain. Patient's daughter presents at bedside during evaluation, stating she is just learned about her mother's condition since Sunday and arrival to the ED.  She is a Presenter, broadcasting.  She states she goes to her mother's house weekly to encourage her to perform personal hygiene.  Her daughter states she has had some psychological issues with delusions and paranoia for some time now.  She has not seen a medical doctor in almost 10 years, last time was when she had the back surgery in 2012.  She  reports her mother has a history of hypertension, and thinks to occasionally try to take her father's blood pressure medications for her blood pressure, however she encourages her not to do so.  She states her baseline is ambulatory without assistance and she can carry out a conversation with her.     The history is provided by the spouse (daughter). The history is limited by the condition of the patient.    Past Medical History:  Diagnosis Date   Allergy    Asthma    DDD (degenerative disc disease), lumbar    DDD (degenerative disc disease), lumbar    Dyslipidemia    Glaucoma     Patient Active Problem List   Diagnosis Date Noted   ICH (intracerebral hemorrhage) ([place]) 01/07/2019   GENERALIZED ANXIETY DISORDER 06/03/2010   UNSPECIFIED ESSENTIAL HYPERTENSION 06/03/2010   HERNIATED LUMBOSACRAL DISC 06/03/2010   LOW BACK PAIN, CHRONIC 06/03/2010   LIPOMA 07/02/2006   MIGRAINE, UNSPEC., W/O INTRACTABLE MIGRAINE 07/02/2006   ASTHMA, UNSPECIFIED 07/02/2006   PELVIC PAIN 625.9 07/02/2006    Past Surgical History:  Procedure Laterality Date   CARPAL TUNNEL RELEASE     EYE SURGERY  march 2013   right and left for glaucoma   lumbar fussion back surgery     ROTATOR CUFF REPAIR  04/2008   right     OB History  No obstetric history on file.      Home Medications    Prior to Admission medications   Medication Sig Start Date End Date Taking? Authorizing Provider  ADVAIR DISKUS 250-50 MCG/DOSE AEPB  04/23/12   [provider]  Aspirin-Acetaminophen-Caffeine (GOODY HEADACHE PO) Take by mouth as needed.    [provider]  betaxolol (BETOPTIC-S) 0.5 % ophthalmic suspension  04/24/12   [provider]  Brinzolamide-Brimonidine [facility] OP) Apply to eye 3 (three) times daily.    [provider]  [facility] 0.05-0.14 MG/DAY  04/22/12   [provider]  CYMBALTA 30 MG capsule  04/07/12   [provider]    Difluprednate (DUREZOL OP) Apply to eye 2 (two) times daily.    [provider]  HYDROcodone-acetaminophen (NORCO) 5-325 MG per tablet Take 1 tablet by mouth every 6 (six) hours as needed.      [provider]  Naproxen Sodium (ALEVE PO) Take by mouth as needed.    [provider]  SYMBICORT 80-4.5 MCG/ACT inhaler  03/18/12   [provider]  Travoprost, BAK Free, (TRAVATAN) 0.004 % SOLN ophthalmic solution Place 1 drop into both eyes at bedtime.    [provider]  VENTOLIN HFA 108 (90 BASE) MCG/ACT inhaler  03/18/12   [provider]    Family History Family History  Problem Relation Age of Onset   Colon cancer Neg Hx    Stomach cancer Neg Hx    Esophageal cancer Neg Hx    Rectal cancer Neg Hx     Social History Social History   Tobacco Use   Smoking status: Never Smoker   Smokeless tobacco: Never Used  Substance Use Topics   Alcohol use: Yes    Alcohol/week: 2.0 standard drinks    Types: 2 Cans of beer per week   Drug use: No     Allergies   Asa buff (mag [buffered aspirin]   Review of Systems Review of Systems  Unable to perform ROS: Mental status change     Physical Exam Updated Vital Signs BP (!) 161/83    Pulse 68    Temp (!) 97.3 F (36.3 C) (Axillary)    Resp 16    LMP 05/29/2010    SpO2 97%   Physical Exam Vitals signs and nursing note reviewed.  Constitutional:      Appearance: She is well-developed. She is ill-appearing.  HENT:     Head: Normocephalic and atraumatic.     Mouth/Throat:     Comments: Mucous membranes are dry Eyes:     Conjunctiva/sclera: Conjunctivae normal.  Cardiovascular:     Rate and Rhythm: Normal rate and regular rhythm.  Pulmonary:     Effort: Pulmonary effort is normal. No respiratory distress.     Comments: Spontaneous respirations. Lungs sounds are rhoncherous. Abdominal:     General: Bowel sounds are normal.     Palpations: Abdomen is soft.  Skin:     General: Skin is warm.  Neurological:     Comments: Pt is somnolent, making purposeful movements of RUE. Will swat my hand away when checking pupils. Pt will not respond to verbal stimuli. Is not following any commands. Pt does not appear to be using left sided extremities and LUE appears somewhat contracted/increased tone. Pupils are contricted, pt gaze appears unequal.       ED Treatments / Results  Labs (all labs ordered are listed, but only abnormal results are displayed) Labs Reviewed  CBC WITH DIFFERENTIAL/PLATELET -  Abnormal; Notable for the following components:      Result Value   WBC 16.4 (*)    Neutro Abs 14.2 (*)    All other components within normal limits  COMPREHENSIVE METABOLIC PANEL - Abnormal; Notable for the following components:   Potassium 2.9 (*)    Glucose, Bld 123 (*)    Total Protein 8.2 (*)    AST 67 (*)    All other components within normal limits  CK - Abnormal; Notable for the following components:   Total CK 2,624 (*)    All other components within normal limits  CBG MONITORING, ED - Abnormal; Notable for the following components:   Glucose-Capillary 103 (*)    All other components within normal limits  SARS CORONAVIRUS 2 (HOSPITAL ORDER, [facility])  PROTIME-INR  LACTIC ACID, PLASMA  AMMONIA  ETHANOL  URINALYSIS, ROUTINE W REFLEX MICROSCOPIC  LACTIC ACID, PLASMA  RAPID URINE DRUG SCREEN, HOSP PERFORMED  HIV ANTIBODY (ROUTINE TESTING W REFLEX)  HEMOGLOBIN A1C  LIPID PANEL  SODIUM  SODIUM  SODIUM    EKG EKG Interpretation  Date/Time:  Tuesday January 11 2019 15:01:46 EDT Ventricular Rate:  68 PR Interval:    QRS Duration: 114 QT Interval:  383 QTC Calculation: 408 R Axis:   13 Text Interpretation:  Sinus rhythm Probable left atrial enlargement Borderline intraventricular conduction delay Borderline repolarization abnormality nonspecific st/ts compared with prior 2/12 Confirmed by Aletta Edouard [phone]) on  01/04/2019 4:51:41 PM   Radiology Ct Head Wo Contrast  Result Date: 01/31/2019 CLINICAL DATA:  Mental status change. Patient fell 2 days ago and was found down. Left-sided weakness and negligence. EXAM: CT HEAD WITHOUT CONTRAST TECHNIQUE: Contiguous axial images were obtained from the base of the skull through the vertex without intravenous contrast. COMPARISON:  Report only from CT head 12/20/2004. FINDINGS: Brain: There is a large acute intraparenchymal hematoma involving the right temporal and frontal lobes. This measures approximately 5.5 x 3.6 x 4.7 cm (volume = 49 cm^3). There is surrounding vasogenic edema and a small amount subarachnoid hemorrhage in the right sylvian fissure and along right convexity. There is 8 mm of right to left midline shift. There is impending uncal herniation and mild asymmetric dilatation of the left lateral ventricle. Vascular:  No hyperdense vessel identified. Skull: Negative for fracture or focal lesion. Sinuses/Orbits: The visualized paranasal sinuses and mastoid air cells are clear. No orbital abnormalities are seen. Other: None. IMPRESSION: 1. Large intraparenchymal hematoma involving the right frontotemporal lobes with associated local mass effect, midline shift and early dilatation of the left lateral ventricle. There is some associated subarachnoid hemorrhage and impending uncal herniation. Emergent neuro surgical consultation recommended. 2. Critical Value/emergent results were called by telephone at the time of interpretation on 01/27/2019 at 5:15 pm to providerJORDAN Akito Boomhower , who verbally acknowledged these results. Electronically Signed   By: Richardean Sale M.D.   On: 01/16/2019 17:21   Dg Chest Port 1 View  Result Date: 01/19/2019 CLINICAL DATA:  Found down. EXAM: PORTABLE CHEST 1 VIEW COMPARISON:  06/11/2010 FINDINGS: The cardiac silhouette, mediastinal and hilar contours are within normal limits. Mild tortuosity of the thoracic aorta. Patchy E left upper lobe  and right basilar atelectasis. No definite infiltrates or effusions. The bony structures are intact. IMPRESSION: Streaky left upper lobe and right basilar atelectasis. Electronically Signed   By: Marijo Sanes M.D.   On: 01/31/2019 16:54    Procedures .Critical Care Performed by: Karle Desrosier, [place] N, PA-C  Authorized by: Annaleise Burger, [place] N, PA-C   Critical care provider statement:    Critical care time (minutes):  60   Critical care time was exclusive of:  Separately billable procedures and treating other patients and teaching time   Critical care was necessary to treat or prevent imminent or life-threatening deterioration of the following conditions:  CNS failure or compromise   Critical care was time spent personally by me on the following activities:  Discussions with consultants, evaluation of patient's response to treatment, examination of patient, ordering and performing treatments and interventions, ordering and review of laboratory studies, ordering and review of radiographic studies, pulse oximetry, re-evaluation of patient's condition, obtaining history from patient or surrogate and review of old charts   I assumed direction of critical care for this patient from another provider in my specialty: no     (including critical care time)  Medications Ordered in ED Medications  potassium chloride 10 mEq in 100 mL IVPB (has no administration in time range)   stroke: mapping our early stages of recovery book (has no administration in time range)  acetaminophen (TYLENOL) tablet 650 mg (has no administration in time range)    Or  acetaminophen (TYLENOL) solution 650 mg (has no administration in time range)    Or  acetaminophen (TYLENOL) suppository 650 mg (has no administration in time range)  senna-docusate (Senokot-S) tablet 1 tablet (has no administration in time range)  pantoprazole (PROTONIX) injection 40 mg (has no administration in time range)  labetalol (NORMODYNE) injection 20 mg  (20 mg Intravenous Given 01/30/2019 1854)    And  clevidipine (CLEVIPREX) infusion 0.5 mg/mL (2 mg/hr Intravenous Rate/Dose Change 01/19/2019 1916)  sodium chloride (hypertonic) 3 % solution (has no administration in time range)  iohexol (OMNIPAQUE) 350 MG/ML injection 75 mL (has no administration in time range)  sodium chloride 0.9 % bolus 1,000 mL (1,000 mLs Intravenous New Bag/Given 01/24/2019 1603)     Initial Impression / Assessment and Plan / ED Course  I have reviewed the triage vital signs and the nursing notes.  Pertinent labs & imaging results that were available during my care of the patient were reviewed by me and considered in my medical decision making (see chart for details).  Clinical Course as of Jan 11 1939  Tue Jan 10, 7958  4046 58 year old female last known well either Saturday or Sunday here with being unresponsive and no spontaneous movement left side of her body.  She probably has a history of hypertension untreated and does not seek medical care.  Reportedly was laying on the floor since Sunday possibly sleeping per her husband.  Here she has pinpoint pupils and increased tone in her left arm but otherwise no spontaneous movement left side.  Not withdrawing to pain on that side.  She has some purposeful movement in her right arm.   [MB]  [id number] Reviewed patient's chest x-ray with no obvious infiltrate.  Awaiting radiology reading.   [MB]  [id number] CT with hemorrhagic stroke vs hypertensive bleed per radiology. Midline shift is present with vasogenic edema. Consult placed to neurosurgery  CT Head Wo Contrast [JR]  1800 Patient discussed with Dr. Annette Stable with neurosurgery.  He will evaluate patient at bedside.  Recommends neurology consult as well.   [JR]  [id number] Pt discussed with Dr. Rory Percy with neurology. Will evaluate the patient.   [JR]    Clinical Course User Index [JR] Wynne Rozak, [place] N, PA-C [MB] Hayden Rasmussen, MD       Patient  brought in from home after being down since  Sunday evening around 11 PM.  Patient's husband reports an unwitnessed fall followed by vomiting though does not believe any head trauma occurred.  She has been unresponsive since Sunday night, and on the floor with incontinence.  On exam, patient appears to be spontaneously moving right upper extremity though left extremity appears to be with somewhat increased tone and no spontaneous movements.  She is somnolent though will respond to painful stimulus and will brush my hand away when examining her eyes.  Mucous membranes are dry.  AMS work-up initiated with labs and CT scan.  Leukocytosis of 16.4, hypokalemia 4.6.  CK is significantly elevated at 2600.  Glucose is 100.  COVID negative.  Ammonia and alcohol level are within normal limits.  Lactic acid is normal.  CT head revealing large R intraparenchymal hematoma with midline shift mass-effect as well as vasogenic edema.  These results were discussed with neurosurgeon, Dr. Annette Stable.  He will evaluate patient in the ED and also recommends neurology consult.  Dr. Rory Percy evaluated patient at bedside.  Recommends CTA head and neck as well as MRI for further evaluation to assess cause of patient's bleed.  She is noted to be hypertensive, however around 0000000 systolic.  Neurology will admit to neuro ICU pending imaging.  Appreciate consult.  The patient appears reasonably stabilized for admission considering the current resources, flow, and capabilities available in the ED at this time, and I doubt any other [facility] requiring further screening and/or treatment in the ED prior to admission.  Final Clinical Impressions(s) / ED Diagnoses   Final diagnoses:  Intraparenchymal hemorrhage of brain [facility])    ED Discharge Orders    None       Shiela Bruns, [place] N, PA-C 01/13/2019 1942    Hayden Rasmussen, MD 01/14/2019 1055

## 2019-01-11 NOTE — H&P (Addendum)
Stroke-ICH H&P CC: Altered mental status  History is obtained from: Sister at bedside, chart  HPI: Jessica Mayer is a 58 y.o. female past medical history of dyslipidemia and hypertension not seen a doctor in many years, last known normal sometime Sunday when she had a fall and continued to be more lethargic and altered.  The husband thought that she was very tired and was sleeping for a long time but she did not wake up and did not sound normal.  Her sister checked up on her on Sunday and also thought that she was very lethargic and sleepy but that was because she was up at night playing and was tired.  It was not until this afternoon that the family thought that she was extremely somnolent, not waking up and not moving the left side of her body much, as when she was brought into the hospital. The ED provider was able to speak with the daughter, who is a Presenter, broadcasting who said that the patient has not seen a doctor, does not take any medications and noncompliant to any kind of medical care, has more psychological issues than any physical problems and gets frequent headaches.  Sister at bedside reported that the patient gets frequent headaches, and sometimes can get emotional but did not elaborate on any other problems. I specifically asked about frequent headaches and declining cognition for which I could not get a straight answer at this time. The sister at bedside told me the husband would be a good person to talk to but he was not available at bedside and not reachable by phone. According to the sister, no history of trauma.  Blood pressures in the emergency room in the 160s to 180s.  Not on any antiplatelets or anticoagulants.  No recent surgeries or trauma.   LKW: 48 hours ago approximately-Sunday, 01/09/2019 at some point tpa given?: no, ICH Premorbid modified Rankin scale (mRS):0 ICH Score: 2 (1 for GCS, 1 for hematoma size greater than 30 cc)   ROS: Unable to perform due to  altered mental status  Past Medical History:  Diagnosis Date  . Allergy   . Asthma   . DDD (degenerative disc disease), lumbar   . DDD (degenerative disc disease), lumbar   . Dyslipidemia   . Glaucoma     Family History  Problem Relation Age of Onset  . Colon cancer Neg Hx   . Stomach cancer Neg Hx   . Esophageal cancer Neg Hx   . Rectal cancer Neg Hx    Social History:   reports that she has never smoked. She has never used smokeless tobacco. She reports current alcohol use of about 2.0 standard drinks of alcohol per week. She reports that she does not use drugs.  Medications  Current Facility-Administered Medications:  .  potassium chloride 10 mEq in 100 mL IVPB, 10 mEq, Intravenous, Q1 Hr x 2, [place], [place], [place]  Current Outpatient Medications:  .  ADVAIR DISKUS 250-50 MCG/DOSE AEPB, , Disp: , Rfl:  .  Aspirin-Acetaminophen-Caffeine (GOODY HEADACHE PO), Take by mouth as needed., Disp: , Rfl:  .  betaxolol (BETOPTIC-S) 0.5 % ophthalmic suspension, , Disp: , Rfl:  .  Brinzolamide-Brimonidine [facility] OP), Apply to eye 3 (three) times daily., Disp: , Rfl:  .  COMBIPATCH 0.05-0.14 MG/DAY, , Disp: , Rfl:  .  CYMBALTA 30 MG capsule, , Disp: , Rfl:  .  Difluprednate (DUREZOL OP), Apply to eye 2 (two) times daily., Disp: , Rfl:  .  HYDROcodone-acetaminophen (NORCO) 5-325 MG per tablet, Take 1 tablet by mouth every 6 (six) hours as needed.  , Disp: , Rfl:  .  Naproxen Sodium (ALEVE PO), Take by mouth as needed., Disp: , Rfl:  .  SYMBICORT 80-4.5 MCG/ACT inhaler, , Disp: , Rfl:  .  Travoprost, BAK Free, (TRAVATAN) 0.004 % SOLN ophthalmic solution, Place 1 drop into both eyes at bedtime., Disp: , Rfl:  .  VENTOLIN HFA 108 (90 BASE) MCG/ACT inhaler, , Disp: , Rfl:   Exam: Current vital signs: BP (!) 158/115   Pulse 70   Temp (!) 97.3 F (36.3 C) (Axillary)   Resp (!) 22   LMP 05/29/2010   SpO2 98%  Vital signs in last 24 hours: Temp:  [97.3 F (36.3 C)] 97.3 F  (36.3 C) (09/08 1500) Pulse Rate:  [69-71] 70 (09/08 1615) Resp:  [17-22] 22 (09/08 1815) BP: (136-173)/(73-115) 158/115 (09/08 1815) SpO2:  [98 %-99 %] 98 % (09/08 1615) General: Awake alert in no distress HEENT: Normocephalic atraumatic Lungs: Clear to auscultation Cardiovascular: Regular rate rhythm Abdomen: Soft nondistended nontender Extremities: Warm well perfused Neurological exam She is very drowsy, appears restless, spontaneously moving the right side much more than the left. Does not follow any commands Is nonverbal Is morning and grunting at times. Able to protect her airway for now Cranial nerves: Pupils are equal round reactive to light, mildly disconjugate gaze with somewhat of a right gaze preference, does not blink to threat from either side, left lower facial weakness noted, unable to test for auditory acuity.  Not cooperative with further cranial nerve examination. Motor exam: Spontaneously moving the right upper and lower extremity and withdrawing it to noxious stimulation full-strength.  Obvious left-sided weakness in both upper and lower extremity as evidenced by less spontaneous movement as well as weaker withdrawal to noxious stimulation. Sensory exam: As above Coordination cannot be tested NIHSS 1a Level of Conscious.: 1 1b LOC Questions: 2 1c LOC Commands: 2 2 Best Gaze: 1 3 Visual: 0 4 Facial Palsy: 2 5a Motor Arm - left: 1 5b Motor Arm - Right: 0 6a Motor Leg - Left: 1 6b Motor Leg - Right: 0 7 Limb Ataxia: 0 8 Sensory: 1 9 Best Language: 3 10 Dysarthria: 2 11 Extinct. and Inatten.: 0 TOTAL: 16    Labs I have reviewed labs in epic and the results pertinent to this consultation are:  CBC    Component Value Date/Time   WBC 16.4 (H) 01/15/2019 1532   RBC 4.99 01/16/2019 1532   HGB 13.5 01/05/2019 1532   HCT 40.4 01/16/2019 1532   PLT 233 01/16/2019 1532   MCV 81.0 01/31/2019 1532   MCH 27.1 01/10/2019 1532   MCHC 33.4 01/28/2019 1532    RDW 13.0 01/14/2019 1532   LYMPHSABS 1.2 01/14/2019 1532   MONOABS 0.9 01/23/2019 1532   EOSABS 0.0 01/04/2019 1532   BASOSABS 0.0 01/07/2019 1532    CMP     Component Value Date/Time   NA 141 01/27/2019 1532   K 2.9 (L) 01/18/2019 1532   CL 104 01/10/2019 1532   CO2 22 01/28/2019 1532   GLUCOSE 123 (H) 02/02/2019 1532   BUN 13 01/13/2019 1532   CREATININE 0.93 01/28/2019 1532   CALCIUM 9.7 01/12/2019 1532   PROT 8.2 (H) 01/15/2019 1532   ALBUMIN 4.1 01/30/2019 1532   AST 67 (H) 01/31/2019 1532   ALT 26 02/02/2019 1532   ALKPHOS 65 01/24/2019 1532   BILITOT 0.7 01/09/2019 1532  GFRNONAA >60 01/29/2019 1532   GFRAA >60 01/22/2019 1532  CK 2624  Imaging I have reviewed the images obtained:  CT-scan of the brain-there is a large hematoma centered in the right temporal and frontal lobes measuring about 45 cc with surrounding vasogenic edema and small amount of subarachnoid hemorrhage in the right sylvian fissure along the right convexity with 8 mm of right to left midline shift.  There is impending uncal herniation and mild asymmetric dilatation of the left lateral ventricle.  Assessment:  58 year old with history of hypertension hyperlipidemia noncompliant with medical care, presenting with 2-day history of altered mental status that proceeded to patient having a fall and then being on the ground/sleeping for over a day before she was brought to medical care noted to have a large right temporal/frontal hematoma measuring about 45 to 48 cc with vasogenic edema, small subarachnoid in the right sylvian fissure and 8 mm right left midline shift along with impending uncal herniation and mild asymmetric dilatation of the left lateral ventricle. Likely a hypertensive bleed. Other differentials include MCA aneurysm (due to the presence of sylvian fissure subarachnoid blood) versus cerebral amyloid angiopathy versus vascular malformation.  Assessment:   Plan: Subcortical ICH,  nontraumatic Cortical ICH, nontraumatic  Acuity: Acute Laterality: Right Current suspected etiology: Under investigation-as above Treatment: -Admit to neurological ICU -ICH Score: 2 -BP control goal SYS<140.  Can liberalize in about a day or so to  less than 160 because the bleed is probably now 53 days old. -NSGY Consult for hematoma evacuation/decompressive hemicraniotomy/EVD -PT/OT/ST  -neuromonitoring -MRI brain without contrast -CTA head and neck -Repeat head CT at midnight  Discussed with Dr. Annette Stable, neurosurgeon, over the phone, who recommends waiting and watching before making a decision on hematoma evacuation.  Do not see a role for EVD placement.  Agrees with the hypertonic therapy.  We will recall and discuss with neurosurgery once the repeat CT is done at 12.  CNS Cerebral edema -Hyperosmolar therapy- 3% saline at 75 cc an hour peripherally.  Anticipate needing central line going forward -NSGY consult  -Close neuro monitoring  Hydrocephalus -Pending neurosurgical consultation  Dysarthria Dysphagia following ICH  -NPO until cleared by speech -ST -May need PEG  Hemiplegia and hemiparesis following nontraumatic intracerebral hemorrhage affecting left non-dominant side  -Continue PT/OT/ST  RESP No active issues but there might have been some aspiration.  Chest x-ray with streaky left upper lobe and right basilar atelectasis.  No obvious pneumonia.  CV Hypertensive Emergency Hypertensive Urgency -Aggressive BP control, goal SBP < 140 for now. -Labetalol PRN, Cleviprex IV. -TTE  GI/GU Normal renal function Elevated CK Hydration- IV fluids as above We will request critical care for assistance with medical management  HEME No acute issues No coagulopathy Monitor CBC in the a.m.  ENDO Not a known diabetic Sugar monitoring per ICU protocol   Fluid/Electrolyte Disorders Hypokalemia -Replete -Repeat labs labs  ID Possible Aspiration PNA -CXR not  indicative of aspiration pneumonia -No antibiotics for now, monitor clinically   Prophylaxis DVT: SCD GI: PPI Bowel: Docusate senna  Dispo: To be determined  Diet: NPO until cleared by speech  Code Status: Full Code    THE FOLLOWING WERE PRESENT ON ADMISSION: ICH, cerebral edema, cerebral herniation, hydrocephalus, hemiparesis, encephalopathy, hypertensive emergency, possible aspiration pneumonia.  -- Amie Portland, MD [facility] Pager: [phone] If 7pm to 7am, please call on call as listed on AMION.  CRITICAL CARE ATTESTATION Performed by: Amie Portland, MD Total critical care time: 35 minutes Critical  care time was exclusive of separately billable procedures and treating other patients and/or supervising APPs/Residents/Students Critical care was necessary to treat or prevent imminent or life-threatening deterioration due to [place]  This patient is critically ill and at significant risk for neurological worsening and/or death and care requires constant monitoring. Critical care was time spent personally by me on the following activities: development of treatment plan with patient and/or surrogate as well as nursing, discussions with consultants, evaluation of patient's response to treatment, examination of patient, obtaining history from patient or surrogate, ordering and performing treatments and interventions, ordering and review of laboratory studies, ordering and review of radiographic studies, pulse oximetry, re-evaluation of patient's condition, participation in multidisciplinary rounds and medical decision making of high complexity in the care of this patient.

## 2019-01-12 ENCOUNTER — Inpatient Hospital Stay (HOSPITAL_COMMUNITY): Payer: Medicaid Other | Admitting: Certified Registered"

## 2019-01-12 ENCOUNTER — Inpatient Hospital Stay (HOSPITAL_COMMUNITY): Payer: Medicaid Other

## 2019-01-12 ENCOUNTER — Other Ambulatory Visit (HOSPITAL_COMMUNITY): Payer: Self-pay

## 2019-01-12 ENCOUNTER — Inpatient Hospital Stay (HOSPITAL_COMMUNITY): Admission: EM | Disposition: E | Payer: Self-pay | Source: Home / Self Care | Attending: Neurosurgery

## 2019-01-12 ENCOUNTER — Encounter (HOSPITAL_COMMUNITY): Payer: Self-pay | Admitting: Certified Registered"

## 2019-01-12 DIAGNOSIS — J9601 Acute respiratory failure with hypoxia: Secondary | ICD-10-CM

## 2019-01-12 HISTORY — PX: CRANIOTOMY: SHX93

## 2019-01-12 LAB — GLUCOSE, CAPILLARY
Glucose-Capillary: 136 mg/dL — ABNORMAL HIGH (ref 70–99)
Glucose-Capillary: 139 mg/dL — ABNORMAL HIGH (ref 70–99)
Glucose-Capillary: 163 mg/dL — ABNORMAL HIGH (ref 70–99)

## 2019-01-12 LAB — PHOSPHORUS: Phosphorus: 1.2 mg/dL — ABNORMAL LOW (ref 2.5–4.6)

## 2019-01-12 LAB — SODIUM
Sodium: 143 mmol/L (ref 135–145)
Sodium: 145 mmol/L (ref 135–145)
Sodium: 156 mmol/L — ABNORMAL HIGH (ref 135–145)

## 2019-01-12 LAB — HEMOGLOBIN A1C
Hgb A1c MFr Bld: 4.9 % (ref 4.8–5.6)
Mean Plasma Glucose: 94 mg/dL

## 2019-01-12 LAB — MRSA PCR SCREENING: MRSA by PCR: NEGATIVE

## 2019-01-12 LAB — PREPARE RBC (CROSSMATCH)

## 2019-01-12 LAB — MAGNESIUM: Magnesium: 1.3 mg/dL — ABNORMAL LOW (ref 1.7–2.4)

## 2019-01-12 LAB — HIV ANTIBODY (ROUTINE TESTING W REFLEX): HIV Screen 4th Generation wRfx: NONREACTIVE

## 2019-01-12 SURGERY — CRANIOTOMY INTRACRANIAL ANEURYSM FOR CAROTID
Anesthesia: General | Site: Head | Laterality: Right

## 2019-01-12 MED ORDER — LIDOCAINE HCL 1 % IJ SOLN
INTRAMUSCULAR | Status: AC
  Filled 2019-01-12: qty 20

## 2019-01-12 MED ORDER — PROPOFOL 10 MG/ML IV BOLUS
INTRAVENOUS | Status: DC | PRN
  Administered 2019-01-12: 30 mg via INTRAVENOUS
  Administered 2019-01-12: 120 mg via INTRAVENOUS
  Administered 2019-01-12: 30 mg via INTRAVENOUS

## 2019-01-12 MED ORDER — LIDOCAINE 2% (20 MG/ML) 5 ML SYRINGE
INTRAMUSCULAR | Status: DC | PRN
  Administered 2019-01-12: 60 mg via INTRAVENOUS

## 2019-01-12 MED ORDER — PRO-STAT SUGAR FREE PO LIQD
30.0000 mL | Freq: Two times a day (BID) | ORAL | Status: DC
  Administered 2019-01-12 – 2019-01-13 (×2): 30 mL
  Filled 2019-01-12 (×2): qty 30

## 2019-01-12 MED ORDER — SUCCINYLCHOLINE CHLORIDE 200 MG/10ML IV SOSY
PREFILLED_SYRINGE | INTRAVENOUS | Status: DC | PRN
  Administered 2019-01-12: 100 mg via INTRAVENOUS

## 2019-01-12 MED ORDER — LEVETIRACETAM IN NACL 500 MG/100ML IV SOLN
500.0000 mg | Freq: Two times a day (BID) | INTRAVENOUS | Status: DC
  Administered 2019-01-13 (×2): 500 mg via INTRAVENOUS
  Filled 2019-01-12 (×3): qty 100

## 2019-01-12 MED ORDER — THROMBIN 5000 UNITS EX SOLR
OROMUCOSAL | Status: DC | PRN
  Administered 2019-01-12 (×2): 5 mL via TOPICAL

## 2019-01-12 MED ORDER — LABETALOL HCL 5 MG/ML IV SOLN
10.0000 mg | INTRAVENOUS | Status: DC | PRN
  Administered 2019-01-13 – 2019-01-14 (×5): 20 mg via INTRAVENOUS
  Filled 2019-01-12 (×2): qty 4

## 2019-01-12 MED ORDER — PHENYLEPHRINE 40 MCG/ML (10ML) SYRINGE FOR IV PUSH (FOR BLOOD PRESSURE SUPPORT)
PREFILLED_SYRINGE | INTRAVENOUS | Status: AC
  Filled 2019-01-12: qty 10

## 2019-01-12 MED ORDER — 0.9 % SODIUM CHLORIDE (POUR BTL) OPTIME
TOPICAL | Status: DC | PRN
  Administered 2019-01-12 (×4): 1000 mL

## 2019-01-12 MED ORDER — MANNITOL 20 % IV SOLN
INTRAVENOUS | Status: AC
  Filled 2019-01-12: qty 500

## 2019-01-12 MED ORDER — BUPIVACAINE HCL 0.5 % IJ SOLN
INTRAMUSCULAR | Status: DC | PRN
  Administered 2019-01-12: 7.5 mL

## 2019-01-12 MED ORDER — BACITRACIN ZINC 500 UNIT/GM EX OINT
TOPICAL_OINTMENT | CUTANEOUS | Status: DC | PRN
  Administered 2019-01-12: 1 via TOPICAL

## 2019-01-12 MED ORDER — PHENYLEPHRINE 40 MCG/ML (10ML) SYRINGE FOR IV PUSH (FOR BLOOD PRESSURE SUPPORT)
PREFILLED_SYRINGE | INTRAVENOUS | Status: DC | PRN
  Administered 2019-01-12 (×3): 40 ug via INTRAVENOUS

## 2019-01-12 MED ORDER — CEFAZOLIN SODIUM 1 G IJ SOLR
INTRAMUSCULAR | Status: AC
  Filled 2019-01-12: qty 20

## 2019-01-12 MED ORDER — VITAL HIGH PROTEIN PO LIQD
1000.0000 mL | ORAL | Status: DC
  Administered 2019-01-13: 10:00:00 1000 mL

## 2019-01-12 MED ORDER — BUPIVACAINE HCL (PF) 0.5 % IJ SOLN
INTRAMUSCULAR | Status: AC
  Filled 2019-01-12: qty 30

## 2019-01-12 MED ORDER — ONDANSETRON HCL 4 MG/2ML IJ SOLN
INTRAMUSCULAR | Status: DC | PRN
  Administered 2019-01-12: 4 mg via INTRAVENOUS

## 2019-01-12 MED ORDER — LEVETIRACETAM IN NACL 1000 MG/100ML IV SOLN
1000.0000 mg | Freq: Once | INTRAVENOUS | Status: AC
  Administered 2019-01-12: 11:00:00 1000 mg via INTRAVENOUS
  Filled 2019-01-12: qty 100

## 2019-01-12 MED ORDER — LIDOCAINE HCL (PF) 1 % IJ SOLN
INTRAMUSCULAR | Status: DC | PRN
  Administered 2019-01-12: 7.5 mL

## 2019-01-12 MED ORDER — MANNITOL 25 % IV SOLN
INTRAVENOUS | Status: AC
  Filled 2019-01-12: qty 50

## 2019-01-12 MED ORDER — FENTANYL CITRATE (PF) 100 MCG/2ML IJ SOLN
INTRAMUSCULAR | Status: DC | PRN
  Administered 2019-01-12: 50 ug via INTRAVENOUS
  Administered 2019-01-12: 100 ug via INTRAVENOUS
  Administered 2019-01-12: 50 ug via INTRAVENOUS

## 2019-01-12 MED ORDER — HEMOSTATIC AGENTS (NO CHARGE) OPTIME
TOPICAL | Status: DC | PRN
  Administered 2019-01-12: 1 via TOPICAL

## 2019-01-12 MED ORDER — DEXAMETHASONE SODIUM PHOSPHATE 10 MG/ML IJ SOLN
INTRAMUSCULAR | Status: DC | PRN
  Administered 2019-01-12: 4 mg via INTRAVENOUS

## 2019-01-12 MED ORDER — ROCURONIUM BROMIDE 10 MG/ML (PF) SYRINGE
PREFILLED_SYRINGE | INTRAVENOUS | Status: DC | PRN
  Administered 2019-01-12: 10 mg via INTRAVENOUS
  Administered 2019-01-12: 20 mg via INTRAVENOUS
  Administered 2019-01-12: 50 mg via INTRAVENOUS

## 2019-01-12 MED ORDER — LIDOCAINE HCL (PF) 1 % IJ SOLN
INTRAMUSCULAR | Status: AC
  Filled 2019-01-12: qty 30

## 2019-01-12 MED ORDER — THROMBIN 20000 UNITS EX SOLR
CUTANEOUS | Status: AC
  Filled 2019-01-12: qty 20000

## 2019-01-12 MED ORDER — THROMBIN 20000 UNITS EX SOLR
CUTANEOUS | Status: DC | PRN
  Administered 2019-01-12: 11:00:00 20 mL via TOPICAL

## 2019-01-12 MED ORDER — LIDOCAINE 2% (20 MG/ML) 5 ML SYRINGE
INTRAMUSCULAR | Status: AC
  Filled 2019-01-12: qty 5

## 2019-01-12 MED ORDER — ROCURONIUM BROMIDE 10 MG/ML (PF) SYRINGE
PREFILLED_SYRINGE | INTRAVENOUS | Status: AC
  Filled 2019-01-12: qty 10

## 2019-01-12 MED ORDER — LEVETIRACETAM IN NACL 1000 MG/100ML IV SOLN
1000.0000 mg | Freq: Once | INTRAVENOUS | Status: AC
  Administered 2019-01-12: 1000 mg via INTRAVENOUS
  Filled 2019-01-12: qty 100

## 2019-01-12 MED ORDER — PHENYLEPHRINE HCL (PRESSORS) 10 MG/ML IV SOLN
INTRAVENOUS | Status: AC
  Filled 2019-01-12: qty 1

## 2019-01-12 MED ORDER — SODIUM CHLORIDE 0.9% IV SOLUTION: Freq: Once | INTRAVENOUS | Status: DC

## 2019-01-12 MED ORDER — SODIUM CHLORIDE 0.9 % IV SOLN
INTRAVENOUS | Status: DC | PRN
  Administered 2019-01-12: 40 ug/min via INTRAVENOUS

## 2019-01-12 MED ORDER — SODIUM CHLORIDE 0.9 % IV SOLN
INTRAVENOUS | Status: DC | PRN
  Administered 2019-01-12: 11:00:00 500 mL

## 2019-01-12 MED ORDER — SUCCINYLCHOLINE CHLORIDE 200 MG/10ML IV SOSY
PREFILLED_SYRINGE | INTRAVENOUS | Status: AC
  Filled 2019-01-12: qty 10

## 2019-01-12 MED ORDER — LACTATED RINGERS IV SOLN
INTRAVENOUS | Status: DC
  Administered 2019-01-16: 14:00:00 via INTRAVENOUS

## 2019-01-12 MED ORDER — THROMBIN 5000 UNITS EX SOLR
CUTANEOUS | Status: AC
  Filled 2019-01-12: qty 5000

## 2019-01-12 MED ORDER — LABETALOL HCL 5 MG/ML IV SOLN
10.0000 mg | INTRAVENOUS | Status: DC | PRN
  Administered 2019-01-12: 21:00:00 10 mg via INTRAVENOUS
  Filled 2019-01-12: qty 4

## 2019-01-12 MED ORDER — INDOCYANINE GREEN 25 MG IV SOLR
5.0000 mg | Freq: Once | INTRAVENOUS | Status: AC
  Administered 2019-01-12: 13:00:00 2.5 mg via INTRAVENOUS
  Filled 2019-01-12: qty 5

## 2019-01-12 MED ORDER — ALBUMIN HUMAN 5 % IV SOLN
INTRAVENOUS | Status: DC | PRN
  Administered 2019-01-12: 13:00:00 via INTRAVENOUS

## 2019-01-12 MED ORDER — PROPOFOL 10 MG/ML IV BOLUS
INTRAVENOUS | Status: AC
  Filled 2019-01-12: qty 20

## 2019-01-12 MED ORDER — DEXAMETHASONE SODIUM PHOSPHATE 10 MG/ML IJ SOLN
INTRAMUSCULAR | Status: AC
  Filled 2019-01-12: qty 1

## 2019-01-12 MED ORDER — SODIUM CHLORIDE 0.9% FLUSH: 10.0000 mL | INTRAVENOUS | Status: DC | PRN

## 2019-01-12 MED ORDER — INDOCYANINE GREEN 25 MG IV SOLR
2.5000 mg | Freq: Once | INTRAVENOUS | Status: DC
  Filled 2019-01-12: qty 25

## 2019-01-12 MED ORDER — MANNITOL 20 % IV SOLN
INTRAVENOUS | Status: DC | PRN
  Administered 2019-01-12: 100 g/h via INTRAVENOUS

## 2019-01-12 MED ORDER — BACITRACIN ZINC 500 UNIT/GM EX OINT
TOPICAL_OINTMENT | CUTANEOUS | Status: AC
  Filled 2019-01-12: qty 28.35

## 2019-01-12 MED ORDER — CEFAZOLIN SODIUM-DEXTROSE 2-3 GM-%(50ML) IV SOLR
INTRAVENOUS | Status: DC | PRN
  Administered 2019-01-12: 2 g via INTRAVENOUS

## 2019-01-12 MED ORDER — ONDANSETRON HCL 4 MG/2ML IJ SOLN
INTRAMUSCULAR | Status: AC
  Filled 2019-01-12: qty 2

## 2019-01-12 MED ORDER — FENTANYL CITRATE (PF) 250 MCG/5ML IJ SOLN
INTRAMUSCULAR | Status: AC
  Filled 2019-01-12: qty 5

## 2019-01-12 MED ORDER — SODIUM CHLORIDE 0.9 % IV SOLN
INTRAVENOUS | Status: DC | PRN
  Administered 2019-01-12 (×2): via INTRAVENOUS

## 2019-01-12 SURGICAL SUPPLY — 111 items
APL SKNCLS STERI-STRIP NONHPOA (GAUZE/BANDAGES/DRESSINGS)
BAG DECANTER FOR FLEXI CONT (MISCELLANEOUS) ×3 IMPLANT
BATTERY IQ STERILE (MISCELLANEOUS) ×2 IMPLANT
BENZOIN TINCTURE PRP APPL 2/3 (GAUZE/BANDAGES/DRESSINGS) IMPLANT
BIT DRILL WIRE PASS 1.3MM (BIT) IMPLANT
BLADE SAW GIGLI 16 STRL (MISCELLANEOUS) IMPLANT
BLADE SURG 15 STRL LF DISP TIS (BLADE) ×1 IMPLANT
BLADE SURG 15 STRL SS (BLADE)
BNDG CMPR 75X41 PLY ABS (GAUZE/BANDAGES/DRESSINGS) ×1
BNDG CMPR 75X41 PLY HI ABS (GAUZE/BANDAGES/DRESSINGS)
BNDG GAUZE ELAST 4 BULKY (GAUZE/BANDAGES/DRESSINGS) ×4 IMPLANT
BNDG STRETCH 4X75 NS LF (GAUZE/BANDAGES/DRESSINGS) ×2 IMPLANT
BNDG STRETCH 4X75 STRL LF (GAUZE/BANDAGES/DRESSINGS) IMPLANT
BTRY SRG DRVR 1.5 IQ (MISCELLANEOUS) ×1
BUR ACORN 6.0 PRECISION (BURR) IMPLANT
BUR ACORN 6.0MM PRECISION (BURR)
BUR MATCHSTICK NEURO 3.0 LAGG (BURR) IMPLANT
BUR ROUND FLUTED 4 SOFT TCH (BURR) IMPLANT
BUR ROUND FLUTED 4MM SOFT TCH (BURR)
BUR ROUND FLUTED 5 RND (BURR) ×1 IMPLANT
BUR ROUND FLUTED 5MM RND (BURR) ×1
BUR SPIRAL ROUTER 2.3 (BUR) ×1 IMPLANT
BUR SPIRAL ROUTER 2.3MM (BUR) ×1
CANISTER SUCT 3000ML PPV (MISCELLANEOUS) ×7 IMPLANT
CARTRIDGE OIL MAESTRO DRILL (MISCELLANEOUS) ×1 IMPLANT
CLIP ANEURY TI PERM STD 8.3 (Clip) ×2 IMPLANT
CLIP ANEURY TI TEMP STD STR 9M (Clip) ×2 IMPLANT
CLIP VESOCCLUDE MED 6/CT (CLIP) ×2 IMPLANT
COVER MAYO STAND STRL (DRAPES) IMPLANT
COVER WAND RF STERILE (DRAPES) ×3 IMPLANT
DECANTER SPIKE VIAL GLASS SM (MISCELLANEOUS) ×3 IMPLANT
DIFFUSER DRILL AIR PNEUMATIC (MISCELLANEOUS) ×3 IMPLANT
DRAPE MICROSCOPE LEICA (MISCELLANEOUS) ×3 IMPLANT
DRAPE NEUROLOGICAL W/INCISE (DRAPES) ×3 IMPLANT
DRAPE WARM FLUID 44X44 (DRAPES) ×3 IMPLANT
DRESSING TELFA 8X10 (GAUZE/BANDAGES/DRESSINGS) ×2 IMPLANT
DRILL WIRE PASS 1.3MM (BIT)
DRSG ADAPTIC 3X8 NADH LF (GAUZE/BANDAGES/DRESSINGS) ×3 IMPLANT
DRSG TELFA 3X8 NADH (GAUZE/BANDAGES/DRESSINGS) IMPLANT
DURAPREP 6ML APPLICATOR 50/CS (WOUND CARE) ×3 IMPLANT
ELECT REM PT RETURN 9FT ADLT (ELECTROSURGICAL) ×3
ELECTRODE REM PT RTRN 9FT ADLT (ELECTROSURGICAL) ×1 IMPLANT
EVACUATOR SILICONE 100CC (DRAIN) IMPLANT
FORCEPS BIPOLAR SPETZLER 8 1.0 (NEUROSURGERY SUPPLIES) ×2 IMPLANT
GAUZE 4X4 16PLY RFD (DISPOSABLE) IMPLANT
GAUZE SPONGE 4X4 12PLY STRL (GAUZE/BANDAGES/DRESSINGS) IMPLANT
GLOVE BIO SURGEON STRL SZ7.5 (GLOVE) ×2 IMPLANT
GLOVE BIO SURGEON STRL SZ8 (GLOVE) ×2 IMPLANT
GLOVE BIOGEL PI IND STRL 7.0 (GLOVE) IMPLANT
GLOVE BIOGEL PI IND STRL 7.5 (GLOVE) ×2 IMPLANT
GLOVE BIOGEL PI INDICATOR 7.0 (GLOVE) ×2
GLOVE BIOGEL PI INDICATOR 7.5 (GLOVE) ×4
GLOVE ECLIPSE 7.0 STRL STRAW (GLOVE) ×6 IMPLANT
GLOVE INDICATOR 8.5 STRL (GLOVE) ×2 IMPLANT
GLOVE SURG SS PI 7.5 STRL IVOR (GLOVE) ×10 IMPLANT
GOWN STRL REUS W/ TWL LRG LVL3 (GOWN DISPOSABLE) ×2 IMPLANT
GOWN STRL REUS W/ TWL XL LVL3 (GOWN DISPOSABLE) IMPLANT
GOWN STRL REUS W/TWL 2XL LVL3 (GOWN DISPOSABLE) IMPLANT
GOWN STRL REUS W/TWL LRG LVL3 (GOWN DISPOSABLE) ×12
GOWN STRL REUS W/TWL XL LVL3 (GOWN DISPOSABLE) ×3
HEMOSTAT POWDER KIT SURGIFOAM (HEMOSTASIS) ×4 IMPLANT
HEMOSTAT SURGICEL 2X14 (HEMOSTASIS) ×1 IMPLANT
HOOK DURA (MISCELLANEOUS) ×3 IMPLANT
HOOK DURA 1/2IN (MISCELLANEOUS) ×3 IMPLANT
KIT BASIN OR (CUSTOM PROCEDURE TRAY) ×3 IMPLANT
KIT DRAIN CSF ACCUDRAIN (MISCELLANEOUS) IMPLANT
KIT TURNOVER KIT B (KITS) ×3 IMPLANT
KNIFE ARACHNOID DISP AM-24-S (MISCELLANEOUS) ×2 IMPLANT
NDL HYPO 25X1 1.5 SAFETY (NEEDLE) ×1 IMPLANT
NDL SPNL 25GX3.5 QUINCKE BL (NEEDLE) IMPLANT
NEEDLE HYPO 22GX1.5 SAFETY (NEEDLE) ×2 IMPLANT
NEEDLE HYPO 25X1 1.5 SAFETY (NEEDLE) IMPLANT
NEEDLE SPNL 25GX3.5 QUINCKE BL (NEEDLE) IMPLANT
NS IRRIG 1000ML POUR BTL (IV SOLUTION) ×9 IMPLANT
OIL CARTRIDGE MAESTRO DRILL (MISCELLANEOUS) ×3
PACK CRANIOTOMY CUSTOM (CUSTOM PROCEDURE TRAY) ×3 IMPLANT
PAD ARMBOARD 7.5X6 YLW CONV (MISCELLANEOUS) ×7 IMPLANT
PAD DRESSING TELFA 3X8 NADH (GAUZE/BANDAGES/DRESSINGS) ×1 IMPLANT
PATTIES SURGICAL .25X.25 (GAUZE/BANDAGES/DRESSINGS) IMPLANT
PATTIES SURGICAL .5 X.5 (GAUZE/BANDAGES/DRESSINGS) ×2 IMPLANT
PATTIES SURGICAL .5 X3 (DISPOSABLE) IMPLANT
PATTIES SURGICAL 1/4 X 3 (GAUZE/BANDAGES/DRESSINGS) IMPLANT
PATTIES SURGICAL 1X1 (DISPOSABLE) IMPLANT
PERFORATOR LRG  14-11MM (BIT)
PERFORATOR LRG 14-11MM (BIT) IMPLANT
PIN MAYFIELD SKULL DISP (PIN) ×2 IMPLANT
PLATE 1.5  2HOLE LNG NEURO (Plate) ×6 IMPLANT
PLATE 1.5 2HOLE LNG NEURO (Plate) IMPLANT
PLATE 1.5 4HOLE LONG STRAIGHT (Plate) ×2 IMPLANT
RUBBERBAND STERILE (MISCELLANEOUS) ×6 IMPLANT
SCREW SELF DRILL HT 1.5/4MM (Screw) ×16 IMPLANT
SPONGE NEURO XRAY DETECT 1X3 (DISPOSABLE) IMPLANT
SPONGE SURGIFOAM ABS GEL 100 (HEMOSTASIS) ×5 IMPLANT
STAPLER VISISTAT 35W (STAPLE) ×3 IMPLANT
STOCKINETTE 6  STRL (DRAPES) ×2
STOCKINETTE 6 STRL (DRAPES) ×1 IMPLANT
SUT ETHILON 3 0 FSL (SUTURE) IMPLANT
SUT NURALON 4 0 TR CR/8 (SUTURE) ×6 IMPLANT
SUT VIC AB 0 CT1 18XCR BRD8 (SUTURE) ×2 IMPLANT
SUT VIC AB 0 CT1 8-18 (SUTURE) ×3
SUT VIC AB 3-0 SH 8-18 (SUTURE) ×5 IMPLANT
TAPE CLOTH 1X10 TAN NS (GAUZE/BANDAGES/DRESSINGS) ×1 IMPLANT
TIP NONSTICK .5MMX23CM (INSTRUMENTS)
TIP NONSTICK .5X23 (INSTRUMENTS) IMPLANT
TOWEL GREEN STERILE (TOWEL DISPOSABLE) ×3 IMPLANT
TOWEL GREEN STERILE FF (TOWEL DISPOSABLE) ×3 IMPLANT
TRAY FOLEY MTR SLVR 16FR STAT (SET/KITS/TRAYS/PACK) ×2 IMPLANT
TUBE CONNECTING 12'X1/4 (SUCTIONS) ×1
TUBE CONNECTING 12X1/4 (SUCTIONS) ×1 IMPLANT
UNDERPAD 30X30 (UNDERPADS AND DIAPERS) IMPLANT
WATER STERILE IRR 1000ML POUR (IV SOLUTION) ×3 IMPLANT

## 2019-01-12 NOTE — Progress Notes (Signed)
CTA shows a right MCA aneurysm, likely the source of the bleed. Repeat CT head stable. Management per neurosurgery.  Primary service transfer to neurosurgery, Dr. Annette Stable. Neurology will be available as needed. Please call us with questions. -- Amie Portland, MD [facility] Pager: [phone] If 7pm to 7am, please call on call as listed on AMION.

## 2019-01-12 NOTE — Progress Notes (Signed)
OT Cancellation Note  Patient Details Name: Jessica Mayer MRN: [id number] DOB: 08-04-1960   Cancelled Treatment:    Reason Eval/Treat Not Completed: Patient not medically ready;Active bedrest order;Patient at procedure or test/ unavailable(Off the floor for surgery. Bedrest. Will return as schedule allows.)  [place], OTR/L Acute Rehab Pager: [phone] Office: [phone] 01/25/2019, 9:14 AM

## 2019-01-12 NOTE — Progress Notes (Signed)
PT Cancellation Note  Patient Details Name: Jessica Mayer MRN: [id number] DOB: Sep 05, 1960   Cancelled Treatment:    Reason Eval/Treat Not Completed: Patient not medically ready (pending potential craniotomy later today; bedrest orders).  Ellamae Sia, PT, DPT Acute Rehabilitation Services Pager [phone] Office [phone]    Willy Eddy 01/22/2019, 8:18 AM

## 2019-01-12 NOTE — Progress Notes (Signed)
No new issues or problems overnight.  Patient remains somnolent but will awaken to stimulation.  Minimally verbalizes in the forms of moans and groans.  Moves her right upper and lower extremity briskly and purposefully.  Still with rather dense left-sided weakness.  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  I discussed situation with Dr. Kathyrn Sheriff.  He will evaluate the patient today and likely take her to the operating room for craniotomy and clipping the aneurysm later today.

## 2019-01-12 NOTE — Progress Notes (Signed)
  NEUROSURGERY PROGRESS NOTE   Pt seen and examined. No issues overnight. History reviewed in EMR and with pts daughter. Briefly, was found down after almost 1.5 days. No known hx of HTN, non-smoker, no known family hx of aneurysms.  EXAM: Temp:  [97.3 F (36.3 C)-99.4 F (37.4 C)] 99.4 F (37.4 C) (09/09 0400) Pulse Rate:  [58-114] 110 (09/09 0700) Resp:  [15-30] 25 (09/09 0700) BP: (95-176)/(65-115) 122/67 (09/09 0700) SpO2:  [94 %-99 %] 96 % (09/09 0700) Weight:  [84 kg] 84 kg (09/08 2000) Intake/Output      09/08 0701 - 09/09 0700 09/09 0701 - 09/10 0700   I.V. (mL/kg) 1240.6 (14.8)    IV Piggyback 93.6    Total Intake(mL/kg) 1334.3 (15.9)    Urine (mL/kg/hr) 975    Total Output 975    Net +359.3          Opens eyes to stimulus Groans to stimulus, non-comprehensibly Pupils reactive Purposeful with RUE/RLE W/d LUE/LLE  LABS: Lab Results  Component Value Date   CREATININE 0.93 01/30/2019   BUN 13 01/06/2019   NA 145 01/23/2019   K 2.9 (L) 01/07/2019   CL 104 01/08/2019   CO2 22 02/01/2019   Lab Results  Component Value Date   WBC 16.4 (H) 01/29/2019   HGB 13.5 01/09/2019   HCT 40.4 01/18/2019   MCV 81.0 01/14/2019   PLT 233 01/09/2019    IMAGING: CT demonstrates primarily right [facility] with large right temporal hematoma with associated mass effect on right temporal horn and mild R->L MLS. No HCP  CTA demonstrates inferiorly and anteriorly projecting right MCA bifurcation aneurysm, fetal-type right PCA. There is a small outpouching seen on the right Acom complex, unclear if it is an aneurysm or branch vessel.  IMPRESSION: - 58 y.o. female subacute presentation, likely [place] d#3 with right temporal hematoma with mass effect related to [facility] bifurcation aneurysm  PLAN: - Will proceed with right craniotomy for evacuation of hematoma and clipping of RMCA aneurysm  I have reviewed the situation with the patient's daughter including my recommendation for surgery  to relieve mass effect from the hematoma and treat the aneurysm at the same time. Risks of surgery were reviewed including risk of stroke and bleeding, as well as risks of SZ, HCP, infection. All her questions were answered and consent was obtained and witnessed.

## 2019-01-12 NOTE — Progress Notes (Signed)
[facility] Progress Note Patient Name: Jessica Mayer DOB: 1960-10-10 MRN: [id number]   Date of Service  01/30/2019  HPI/Events of Note  Pt is s/p aneurysm clipping with max dose Cleviprex and SBP exceeding goal of < 140 mmHg.  eICU Interventions  Labetalol order changed to 10 mg iv prn SBP > 140 but < 150 mmHg, and 20 mg iv prn SBP > 150 mmHg.        Kerry Kass Anetha Slagel 01/24/2019, 10:27 PM

## 2019-01-12 NOTE — Progress Notes (Signed)
Pt had tonic clonic seizure that lasted approx 1 min.  RN called Margo Aye, NP with neurosurg.  Orders to give 1g Keppra IV now and to start 500mg  Keppra BID.  Ordered STAT head CT.  Pending results.  Pt neurologically the same as prior to sz... obtunded but yet purposeful on the R side.  No FC - pupils = reactive.

## 2019-01-12 NOTE — Progress Notes (Signed)
SLP Cancellation Note  Patient Details Name: Jessica Mayer MRN: [id number] DOB: 11-May-1960   Cancelled treatment:        Pt in OR for craniotomy. Will see tomorrow.                                                                                                 [place] Siren 01/14/2019, 10:48 AM  Orbie Pyo Colvin Caroli.Ed Risk analyst [phone] Office [phone]

## 2019-01-12 NOTE — Anesthesia Postprocedure Evaluation (Signed)
Anesthesia Post Note  Patient: Clinical biochemist  Procedure(s) Performed: CRANIOTOMY INTRACRANIAL ANEURYSM FOR MIDDLE CEREBRAL ARTERY (Right Head)     Patient location during evaluation: PACU Anesthesia Type: General Level of consciousness: awake and alert Pain management: pain level controlled Vital Signs Assessment: post-procedure vital signs reviewed and stable Respiratory status: spontaneous breathing, nonlabored ventilation, respiratory function stable and patient connected to nasal cannula oxygen Cardiovascular status: blood pressure returned to baseline and stable Postop Assessment: no apparent nausea or vomiting Anesthetic complications: no    Last Vitals:  Vitals:   01/17/2019 1403 01/08/2019 1418  BP: 129/84 128/80  Pulse: 100 95  Resp: 20 20  Temp:    SpO2: 100% 97%    Last Pain:  Vitals:   01/11/2019 0400  TempSrc: Axillary        RLE Motor Response: Purposeful movement (01/13/2019 1418)        Jameyah Fennewald S

## 2019-01-12 NOTE — Op Note (Signed)
NEUROSURGERY OPERATIVE NOTE   PREOP DIAGNOSIS:  1. Subarachnoid Hemorrhage 2. Right temporal hematoma   POSTOP DIAGNOSIS: Same  PROCEDURE: 1. Right frontotemporal craniotomy for clipping of middle cerebral artery aneurysm, complex due to the need for temporary clipping of the middle cerebral artery 2. Evacuation of right temporal hematoma 3. Use of intraoperative microscope for microdissection 4. Intraoperative indocyanine green video angiography  SURGEON: Dr. Consuella Lose, MD  ASSISTANT: Dr. Kary Kos, MD  ANESTHESIA: General Endotracheal  EBL: 400cc  SPECIMENS: None  DRAINS: None  COMPLICATIONS: None immediate  CONDITION: Hemodynamically stable   HISTORY: Jessica Mayer is a 58 y.o. female initially presented to the hospital after being found down, likely at least 48 hours after the ictus.  Upon her arrival she was largely unresponsive, with left hemiparesis.  CT scan demonstrated diffuse subarachnoid hemorrhage with a relatively large right temporal hematoma.  Further work-up included CT angiogram which demonstrated the presence of a right middle cerebral artery bifurcation aneurysm.  Given the presence of the hematoma with mass-effect and the right middle cerebral artery aneurysm, craniotomy for evacuation and clipping was recommended.  The risks and benefits of the surgery were reviewed in detail with the patient's daughter.  After all questions were answered informed consent was obtained and witnessed.  PROCEDURE IN DETAIL: The patient was brought to the operating room. After induction of general anesthesia, the patient was positioned on the operative table in the [place] head holder in the supine position. All pressure points were meticulously padded.  Standard frontotemporal skin incision was then marked out and prepped and draped in the usual sterile fashion.  After timeout was conducted, the incision was infiltrated with local anesthetic without  epinephrine.  Incision was then made sharply and carried down through the galea.  Bovie electrocautery was then he was then used to incise the temporal fascia and the temporalis muscle the temporalis muscle.  A single piece myocutaneous flap was then elevated and retracted anteriorly.  Bur holes were then created in the pterion, over the root of the zygoma, and just over the LOC, and the superior temporal line.  Craniotomy flap was then fashioned and elevated.  Hemostasis was secured on the epidural plane.  The lesser wing of the sphenoid was then drilled down to allow direct access to the optical carotid cistern.  Hemostasis from bone edges was secured with bone wax.  The dura was then opened in a curvilinear fashion and reflected anteriorly.  The microscope was then draped sterilely and brought into the field and the remainder of the case was done under the microscope using microdissection technique.  Initially, the frontal lobe was elevated and the optic nerve was identified.  Arachnoid overlying the optic nerve and optic nerve was then cut, and dissection was Carried laterally to identify the proximal portion of the internal carotid artery.  This allowed egress of CSF from the optical carotid cistern and provided a significant amount of brain relaxation.  At this point attention was turned more distally in the sylvian fissure, and the sylvian fissure was dissected.  Once the neck of the of the aneurysm at the MCA bifurcation was identified patient was identified, attention was again turned more proximally to the sylvian fissure where the internal carotid artery but artery bifurcation was at artery bifurcation was identified and the proximal M1 was also identified.  A location for temporary.  Up occlusion was then identified on the proximal M1.  At this point, the neck of the aneurysm was  further dissect dissected.  The dome did appear to be somewhat adherent to the temporal lobe.  We therefore placed a  temporary clip across the M1, and the anesthesia service administered a bolus dose of propofol in order to induce burst suppression to reduce cerebral metabolic requirement of oxygen.  The dome of the aneurysm was then dissected away from the temporal lobe using bipolar electrocautery and microscissors.  This allowed identification of the M1 leading into the aneurysm, as well as the 2 MCA branches.  The aneurysm appeared to be somewhat dysplastic incorporating the entire MCA bifurcation.  A standard 8 mm curved titanium aneurysm clip was then selected, and placed across the neck of the aneurysm taking care to preserve the M1 as well as the distal M2 branches.  Post clipping inspection revealed that the clip blades were not incorporating any of the MCA vessels.  At this point the anesthesia service administered a bolus dose of ICG and video angiography confirmed confirmed patency of the M1 and both M2 vessels, without any filling of the aneurysm dome any filling of the aneurysm dome.  At this point, hemostasis was secured using a combination of bipolar electrocautery and electrocautery and morselized Gelfoam with thrombin.   Initially, I coagulated the pia of the inferior frontal gyrus along the operculum.  Dissection of the white matter was not easily revealing of the hematoma.  I therefore filled the cavity with Gelfoam, and attention was then turned to the superior temporal gyrus.  Bipolar electrocautery was used to coagulate the pia.  Suction was then used to dissect through the white matter where a large temporal hematoma was identified and removed.  Hemostasis was then again secured using morselized Gelfoam with thrombin.  At this point the wound was irrigated with a copious amount of normal saline irrigation.  No active bleeding on the brain surface was identified.  The dura was then reapproximated with interrupted 4-0 Nurolon stitches.  A layer of Gelfoam was placed.  Bone flap was then replaced and  plated with standard titanium plates and screws.  The temporalis muscle was reapproximated with interrupted 0 Vicryl stitches.  The galea was closed with interrupted 0 and 3-0 Vicryl stitches.  Skin was then closed with staples.  Bacitracin ointment and sterile dressing was applied.  Patient was then removed from the [place] head holder and a head wrap was applied.  At the end of the case all sponge, needle, cottonoid, and instrument counts were correct.

## 2019-01-12 NOTE — Anesthesia Preprocedure Evaluation (Addendum)
Anesthesia Evaluation  Patient identified by MRN, date of birth, ID band Patient confused    Reviewed: Allergy & Precautions, H&P , NPO status , Patient's Chart, lab work & pertinent test results  Airway Mallampati: II  TM Distance: >3 FB Neck ROM: Full    Dental no notable dental hx. (+) Teeth Intact, Dental Advisory Given   Pulmonary asthma ,    Pulmonary exam normal breath sounds clear to auscultation       Cardiovascular hypertension,  Rhythm:Regular Rate:Normal     Neuro/Psych  Headaches, Anxiety CVA, Residual Symptoms    GI/Hepatic negative GI ROS, Neg liver ROS,   Endo/Other  negative endocrine ROS  Renal/GU negative Renal ROS  negative genitourinary   Musculoskeletal  (+) Arthritis , Osteoarthritis,    Abdominal   Peds  Hematology negative hematology ROS (+)   Anesthesia Other Findings   Reproductive/Obstetrics negative OB ROS                            Anesthesia Physical Anesthesia Plan  ASA: III  Anesthesia Plan: General   Post-op Pain Management:    Induction: Intravenous  PONV Risk Score and Plan: 3 and Ondansetron  Airway Management Planned: Oral ETT  Additional Equipment: Arterial line  Intra-op Plan:   Post-operative Plan: Possible Post-op intubation/ventilation  Informed Consent: I have reviewed the patients History and Physical, chart, labs and discussed the procedure including the risks, benefits and alternatives for the proposed anesthesia with the patient or authorized representative who has indicated his/her understanding and acceptance.     Dental advisory given  Plan Discussed with: CRNA  Anesthesia Plan Comments:         Anesthesia Quick Evaluation

## 2019-01-12 NOTE — Transfer of Care (Signed)
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  Last Pain:  Vitals:   01/16/2019 0400  TempSrc: Axillary         Complications: No apparent anesthesia complications

## 2019-01-12 NOTE — Consult Note (Signed)
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 HISTORY OF PRESENT ILLNESS   58 year old female is reported to have lethargy 48 hours prior to admission on 01/18/2019.  Initially admitted by neurology treated with 3% saline and neurosurgical consult was called.  Follow-up CT arteriogram revealed a right MCA aneurysm with hematoma and she was subsequently taken to the operating room by neurosurgery on 01/18/2019 and transferred to the neurosurgical service.  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 She is left intubated per anesthesia pulmonary critical care was asked to evaluate and assist in her care on full mechanical ventilatory support. SIGNIFICANT PAST MEDICAL HISTORY   Hypertension with noncompliance of medications Glaucoma on 3 eyedrops Anxiety  SIGNIFICANT EVENTS:  01/30/2019 - right frontal temporal craniotomy with clipping of right middle cerebral aneurysm and evacuation of right temporal hematoma  STUDIES:   CT scans revealing a right MCA aneurysm and hematoma  CULTURES:  MRSA negative SARS-CoV-2 negative  ANTIBIOTICS:  None  LINES/TUBES:  01/17/2019 endotracheal tube>>  CONSULTANTS:  01/17/2019 pulmonary critical care SUBJECTIVE:  58 year old female with a right MCA aneurysm status post clipping return to intensive care unit extubated.  CONSTITUTIONAL: BP (!) 148/87   Pulse 75   Temp 99.4 F (37.4 C)  (Axillary)   Resp 19   Wt 84 kg   LMP 05/29/2010   SpO2 97%   BMI 32.80 kg/m   I/O last 3 completed shifts: In: 1334.3 [I.V.:1240.6; IV Piggyback:93.6] Out: 975 [Urine:975]        PHYSICAL EXAM: General:  Moderate obesity, somnolent Neuro:  Resists eye opening. Moves left side spontaneously with normal strength.  Faint withdrawal on the right side. HEENT: Extubated, NG tube in place Cardiovascular: Hypertensive. HS normal. Extremities warm Lungs:  Clear bilaterally Abdomen:  NGT in place. Abdomen soft.  Musculoskeletal:  No edema. Skin:  Craniotomy bandage in place.  RESOLVED PROBLEM LIST   ASSESSMENT AND PLAN   Critically ill due to uncontrolled hypertension in the context of recently secured aneurysm requiring titration of Cleviprex. - Titrate to goal of SBP 100-140 - Liberalize BP goals starting tomorrow.  Critically due to encephalopathy associated with right MCA aneurysm with rupture with associated cerebral edema requiring frequent neurochecks and titration of hyperosmolar therapy. - minimize sedative analgesics - remains at high risk for airway compromise. - Continue 3% NaCl to maintain normal sodium - should be able to discontinue now that clot evacuated.  History of glaucoma - Resume home glaucoma treatments  SUMMARY OF TODAY'S PLAN:  58 year old female with history of hypertension with noncompliance with medications.  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  She has been taken to the operating room today 01/08/2019 per neurosurgery for clipping of a right MCA aneurysm.  She remains intubated pulmonary critical care asked to assist with her care per neurosurgery.  Best Practice / Goals of Care / Disposition.   DVT PROPHYLAXIS:SCD SUP: PPI NUTRITION: N.p.o. tube feedings if her mental stability greater than 24  hours MOBILITY: Bedrest GOALS OF CARE: Full code FAMILY DISCUSSIONS: Husband and family updated by neurosurgery 01/18/2019 DISPOSITION return to  the intensive care unit postop  LABS  Glucose Recent Labs  Lab 01/07/2019 1545  GLUCAP 103*    BMET Recent Labs  Lab 01/28/2019 1532 01/22/2019 2018 01/27/2019 0045 01/12/19 0613  NA 141 136 143 145  K 2.9*  --   --   --   CL 104  --   --   --   CO2 22  --   --   --   BUN 13  --   --   --   CREATININE 0.93  --   --   --   GLUCOSE 123*  --   --   --     Liver Enzymes Recent Labs  Lab 01/19/2019 1532  AST 67*  ALT 26  ALKPHOS 65  BILITOT 0.7  ALBUMIN 4.1    Electrolytes Recent Labs  Lab 01/14/2019 1532  CALCIUM 9.7    CBC Recent Labs  Lab 01/30/2019 1532  WBC 16.4*  HGB 13.5  HCT 40.4  PLT 233    ABG No results for input(s): PHART, PCO2ART, PO2ART in the last 168 hours.  Coag's Recent Labs  Lab 01/07/2019 1532  INR 1.1    Sepsis Markers Recent Labs  Lab 01/25/2019 1600 01/14/2019 2018  LATICACIDVEN 1.5 1.9    Cardiac Enzymes No results for input(s): TROPONINI, PROBNP in the last 168 hours.  PAST MEDICAL HISTORY :   She  has a past medical history of Allergy, Asthma, DDD (degenerative disc disease), lumbar, DDD (degenerative disc disease), lumbar, Dyslipidemia, and Glaucoma.  PAST SURGICAL HISTORY:  She  has a past surgical history that includes Rotator cuff repair (04/2008); Carpal tunnel release; Eye surgery (march 2013); and lumbar fussion back surgery.  Allergies  Allergen Reactions  . Asa Buff (Mag [Buffered Aspirin] Nausea And Vomiting    No current facility-administered medications on file prior to encounter.    Current Outpatient Medications on File Prior to Encounter  Medication Sig  . ADVAIR DISKUS 250-50 MCG/DOSE AEPB   . Aspirin-Acetaminophen-Caffeine (GOODY HEADACHE PO) Take by mouth as needed.  . betaxolol (BETOPTIC-S) 0.5 % ophthalmic suspension   . Brinzolamide-Brimonidine (SIMBRINZA OP) Apply to eye 3 (three) times daily.  . COMBIPATCH 0.05-0.14 MG/DAY   . CYMBALTA 30 MG capsule   . Difluprednate (DUREZOL OP) Apply to eye 2  (two) times daily.  Marland Kitchen HYDROcodone-acetaminophen (NORCO) 5-325 MG per tablet Take 1 tablet by mouth every 6 (six) hours as needed.    . Naproxen Sodium (ALEVE PO) Take by mouth as needed.  . SYMBICORT 80-4.5 MCG/ACT inhaler   . Travoprost, BAK Free, (TRAVATAN) 0.004 % SOLN ophthalmic solution Place 1 drop into both eyes at bedtime.  . VENTOLIN HFA 108 (90 BASE) MCG/ACT inhaler     FAMILY HISTORY:   Her family history is negative for Colon cancer, Stomach cancer, Esophageal cancer, and Rectal cancer.  SOCIAL HISTORY:  She  reports that she has never smoked. She has never used smokeless tobacco. She reports current alcohol use of about 2.0 standard drinks of alcohol per week. She reports that she does not use drugs.  REVIEW OF SYSTEMS:    Not able to provide due to mental status.  CRITICAL CARE Performed by: Kipp Brood   Total critical care time: 40 minutes  Critical care time was exclusive of separately billable procedures and treating other patients.  Critical care was necessary  to treat or prevent imminent or life-threatening deterioration.  Critical care was time spent personally by me on the following activities: development of treatment plan with patient and/or surrogate as well as nursing, discussions with consultants, evaluation of patient's response to treatment, examination of patient, obtaining history from patient or surrogate, ordering and performing treatments and interventions, ordering and review of laboratory studies, ordering and review of radiographic studies, pulse oximetry, re-evaluation of patient's condition and participation in multidisciplinary rounds.  Kipp Brood, MD [facility] ICU Physician [place]  Pager: [phone] Mobile: [phone] After hours: [phone].   01/28/2019, 9:33 AM

## 2019-01-12 NOTE — Anesthesia Procedure Notes (Signed)
Procedure Name: Intubation Date/Time: 01/18/2019 9:57 AM Performed by: Moshe Salisbury, CRNA Pre-anesthesia Checklist: Patient identified, Emergency Drugs available, Suction available and Patient being monitored Patient Re-evaluated:Patient Re-evaluated prior to induction Oxygen Delivery Method: Circle System Utilized Preoxygenation: Pre-oxygenation with 100% oxygen Induction Type: IV induction and Rapid sequence Laryngoscope Size: Mac and 3 Grade View: Grade I Tube type: Oral Tube size: 7.5 mm Number of attempts: 1 Airway Equipment and Method: Stylet Placement Confirmation: ETT inserted through vocal cords under direct vision,  positive ETCO2 and breath sounds checked- equal and bilateral Secured at: 22 cm Tube secured with: Tape Dental Injury: Teeth and Oropharynx as per pre-operative assessment

## 2019-01-13 ENCOUNTER — Inpatient Hospital Stay (HOSPITAL_COMMUNITY): Payer: Medicaid Other

## 2019-01-13 ENCOUNTER — Encounter (HOSPITAL_COMMUNITY): Payer: Self-pay | Admitting: Neurosurgery

## 2019-01-13 DIAGNOSIS — I340 Nonrheumatic mitral (valve) insufficiency: Secondary | ICD-10-CM

## 2019-01-13 LAB — PHOSPHORUS
Phosphorus: 1.6 mg/dL — ABNORMAL LOW (ref 2.5–4.6)
Phosphorus: 1.3 mg/dL — ABNORMAL LOW (ref 2.5–4.6)

## 2019-01-13 LAB — BASIC METABOLIC PANEL
BUN: 7 mg/dL (ref 6–20)
CO2: 21 mmol/L — ABNORMAL LOW (ref 22–32)
Calcium: 8.1 mg/dL — ABNORMAL LOW (ref 8.9–10.3)
Chloride: >130 mmol/L (ref 98–111)
Creatinine, Ser: 0.7 mg/dL (ref 0.44–1.00)
GFR calc Af Amer: >60 mL/min (ref >60)
GFR calc non Af Amer: >60 mL/min (ref >60)
Glucose, Bld: 197 mg/dL — ABNORMAL HIGH (ref 70–99)
Potassium: 2.2 mmol/L — CL (ref 3.5–5.1)
Sodium: 163 mmol/L (ref 135–145)

## 2019-01-13 LAB — GLUCOSE, CAPILLARY
Glucose-Capillary: 174 mg/dL — ABNORMAL HIGH (ref 70–99)
Glucose-Capillary: 153 mg/dL — ABNORMAL HIGH (ref 70–99)
Glucose-Capillary: 192 mg/dL — ABNORMAL HIGH (ref 70–99)
Glucose-Capillary: 156 mg/dL — ABNORMAL HIGH (ref 70–99)
Glucose-Capillary: 150 mg/dL — ABNORMAL HIGH (ref 70–99)
Glucose-Capillary: 143 mg/dL — ABNORMAL HIGH (ref 70–99)

## 2019-01-13 LAB — ECHOCARDIOGRAM COMPLETE: Weight: 2973.56 oz

## 2019-01-13 LAB — MAGNESIUM
Magnesium: 1.9 mg/dL (ref 1.7–2.4)
Magnesium: 1.9 mg/dL (ref 1.7–2.4)

## 2019-01-13 LAB — SODIUM
Sodium: 161 mmol/L (ref 135–145)
Sodium: 162 mmol/L (ref 135–145)

## 2019-01-13 MED ORDER — LACTATED RINGERS IV BOLUS
1000.0000 mL | Freq: Once | INTRAVENOUS | Status: AC
  Administered 2019-01-13: 10:00:00 1000 mL via INTRAVENOUS

## 2019-01-13 MED ORDER — POTASSIUM CHLORIDE 10 MEQ/100ML IV SOLN
10.0000 meq | INTRAVENOUS | Status: AC
  Administered 2019-01-14: 10 meq via INTRAVENOUS
  Filled 2019-01-13: qty 100

## 2019-01-13 MED ORDER — BETAXOLOL HCL 0.5 % OP SOLN
1.0000 [drp] | Freq: Two times a day (BID) | OPHTHALMIC | Status: DC
  Administered 2019-01-13 – 2019-01-26 (×27): 1 [drp] via OPHTHALMIC
  Filled 2019-01-13 (×2): qty 5

## 2019-01-13 MED ORDER — LACTATED RINGERS IV SOLN
INTRAVENOUS | Status: DC
  Administered 2019-01-13: 17:00:00 via INTRAVENOUS

## 2019-01-13 MED ORDER — POTASSIUM CHLORIDE 20 MEQ PO PACK
40.0000 meq | PACK | Freq: Two times a day (BID) | ORAL | Status: DC
  Administered 2019-01-13: 40 meq via ORAL
  Filled 2019-01-13 (×2): qty 2

## 2019-01-13 MED ORDER — K PHOS MONO-SOD PHOS DI & MONO 155-852-130 MG PO TABS
500.0000 mg | ORAL_TABLET | Freq: Three times a day (TID) | ORAL | Status: DC
  Administered 2019-01-14: 500 mg via ORAL
  Filled 2019-01-13 (×2): qty 2

## 2019-01-13 MED ORDER — BRINZOLAMIDE 1 % OP SUSP
1.0000 [drp] | Freq: Three times a day (TID) | OPHTHALMIC | Status: DC
  Administered 2019-01-13 – 2019-01-26 (×41): 1 [drp] via OPHTHALMIC
  Filled 2019-01-13: qty 10

## 2019-01-13 MED ORDER — VITAL HIGH PROTEIN PO LIQD
1000.0000 mL | ORAL | Status: DC
  Administered 2019-01-13 – 2019-01-15 (×3): 1000 mL

## 2019-01-13 MED ORDER — LATANOPROST 0.005 % OP SOLN
1.0000 [drp] | Freq: Every day | OPHTHALMIC | Status: DC
  Administered 2019-01-13 – 2019-01-25 (×13): 1 [drp] via OPHTHALMIC
  Filled 2019-01-13: qty 2.5

## 2019-01-13 MED ORDER — SODIUM CHLORIDE 3 % IV SOLN
INTRAVENOUS | Status: DC
  Filled 2019-01-13 (×2): qty 500

## 2019-01-13 MED FILL — Gelatin Absorbable MT Powder: OROMUCOSAL | Qty: 1 | Status: AC

## 2019-01-13 MED FILL — Thrombin For Soln 5000 Unit: CUTANEOUS | Qty: 5000 | Status: AC

## 2019-01-13 NOTE — Progress Notes (Addendum)
OT Cancellation Note  Patient Details Name: Jessica Mayer MRN: [id number] DOB: [birth year]   Cancelled Treatment:    Reason Eval/Treat Not Completed: Active bedrest order, not medically ready; of note pt now on continuous EEG, RN request to hold therapies today, not appropriate. Will follow.  Lou Cal, OT [facility] Pager [phone] Office [phone]    Raymondo Band 01/13/2019, 8:31 AM

## 2019-01-13 NOTE — Progress Notes (Signed)
  Speech Language Pathology  Patient Details Name: Jessica Mayer MRN: [id number] DOB: [birth year] Today's Date: 01/13/2019 Time:  -     Attempted to see pt for speech-language assessment. Pt having in room procedure. Will continue efforts.                     Houston Siren 01/13/2019, 12:05 PM   Orbie Pyo Colvin Caroli.Ed Risk analyst [phone] Office [phone]

## 2019-01-13 NOTE — Progress Notes (Signed)
CRITICAL VALUE ALERT  Critical Value:  Na+ 163, K+ 2.2, Chl >130  Date & Time Notied:  01/13/19 1645  Provider Notified: Dr Wallene Dales  Orders Received/Actions taken: Awaiting new orders

## 2019-01-13 NOTE — Progress Notes (Signed)
LTM EEG hooked up and running - no initial skin breakdown - push button tested - neuro notified.  

## 2019-01-13 NOTE — Progress Notes (Signed)
PT Cancellation Note  Patient Details Name: Jessica Mayer MRN: [id number] DOB: Oct 18, 1960   Cancelled Treatment:    Reason Eval/Treat Not Completed: Patient not medically ready (receiving continuous EEG; not appropriate per RN).  Ellamae Sia, PT, DPT Acute Rehabilitation Services Pager [phone] Office [phone]    Willy Eddy 01/13/2019, 1:35 PM

## 2019-01-13 NOTE — Progress Notes (Signed)
EEG complete - results pending 

## 2019-01-13 NOTE — Progress Notes (Signed)
  Echocardiogram 2D Echocardiogram has been performed.  Escarlet Saathoff G Ayano Douthitt 01/13/2019, 11:27 AM

## 2019-01-13 NOTE — Progress Notes (Signed)
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 HISTORY OF PRESENT ILLNESS   58 year old female is reported to have lethargy 48 hours prior to admission on 01/14/2019.  Initially admitted by neurology treated with 3% saline and neurosurgical consult was called.  Follow-up CT arteriogram revealed a right MCA aneurysm with hematoma and she was subsequently taken to the operating room by neurosurgery on 01/20/2019 and transferred to the neurosurgical service.  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 She is left intubated per anesthesia pulmonary critical care was asked to evaluate and assist in her care on full mechanical ventilatory support. SIGNIFICANT PAST MEDICAL HISTORY   Hypertension with noncompliance of medications Glaucoma on 3 eyedrops Anxiety  SIGNIFICANT EVENTS:  01/13/2019 - right frontal temporal craniotomy with clipping of right middle cerebral aneurysm and evacuation of right temporal hematoma  STUDIES:   CT scans revealing a right MCA aneurysm and hematoma  CULTURES:  MRSA negative SARS-CoV-2 negative  ANTIBIOTICS:  None  LINES/TUBES:  01/27/2019 endotracheal tube>> 01/04/2019  CONSULTANTS:  01/25/2019 pulmonary critical care SUBJECTIVE:  58 year old female with a right MCA aneurysm status post clipping return to intensive care unit extubated.  CONSTITUTIONAL: BP 139/61   Pulse 93   Temp 100.2 F (37.9 C)  (Axillary)   Resp (!) 27   Wt 84.3 kg   LMP 05/29/2010   SpO2 99%   BMI 32.92 kg/m   I/O last 3 completed shifts: In: 4719.3 [I.V.:3875.6; IV Piggyback:843.6] Out: [id number] [Urine:5125; Blood:400]        PHYSICAL EXAM: General: Obese female who is extubated.  Does not follow commands.  Moves right side spontaneously HEENT: Noted to have noisy airway, drooling.  Pulls equal react to light.  Cranial dressing dry and intact Neuro: Moves right side spontaneously does not follow commands. CV: Heart sounds are regular regular rate and rhythm PULM: Breath sounds are diminished GI: soft, bsx4 active  Extremities: warm/dry neck edema  Skin: no rashes or lesions   RESOLVED PROBLEM LIST   ASSESSMENT AND PLAN   Critically ill due to uncontrolled hypertension in the context of recently secured aneurysm requiring titration of Cleviprex. Titrate medication with goal systolic blood pressure approximately 140.  With concern for vasospasm higher systolic blood pressure would be acceptable   Critically due to encephalopathy associated with right MCA aneurysm with rupture with associated cerebral edema requiring frequent neurochecks and titration of hyperosmolar therapy. Minimize sedation She continues to be a high risk for airway compromise Continue to monitor sodium   History of glaucoma  Resume home glaucoma treatment  SUMMARY OF TODAY'S PLAN:  01/13/2019 patient underwent aneurysm clipping 01/31/2019 return to the ICU extubated.  He is hypertensive.  She is unable to eat at this time due to altered mental status.  Continue to monitor in ICU.  Best Practice / Goals of Care / Disposition.   DVT PROPHYLAXIS:SCD SUP: PPI NUTRITION: N.p.o. MOBILITY: Bedrest GOALS OF CARE: Full code FAMILY DISCUSSIONS: Family updated per neurosurgery DISPOSITION currently  intensive care unit extubated  LABS  Glucose Recent Labs  Lab 01/30/2019 1545 01/25/2019 1529 01/04/2019 1939 01/11/2019 2330  01/13/19 0349 01/13/19 0802  GLUCAP 103* 136* 139* 163* 174* 153*    BMET Recent Labs  Lab 01/10/2019 1532  02/02/2019 0613 01/17/2019 1552 01/13/19 0751  NA 141   < > 145 156* 161*  K 2.9*  --   --   --   --   CL 104  --   --   --   --   CO2 22  --   --   --   --   BUN 13  --   --   --   --   CREATININE 0.93  --   --   --   --   GLUCOSE 123*  --   --   --   --    < > = values in this interval not displayed.    Liver Enzymes Recent Labs  Lab 01/05/2019 1532  AST 67*  ALT 26  ALKPHOS 65  BILITOT 0.7  ALBUMIN 4.1    Electrolytes Recent Labs  Lab 01/30/2019 1532 01/23/2019 1552 01/13/19 0430  CALCIUM 9.7  --   --   MG  --  1.3* 1.9  PHOS  --  1.2* 1.6*    CBC Recent Labs  Lab 01/06/2019 1532  WBC 16.4*  HGB 13.5  HCT 40.4  PLT 233    ABG No results for input(s): PHART, PCO2ART, PO2ART in the last 168 hours.  Coag's Recent Labs  Lab 01/13/2019 1532  INR 1.1    Sepsis Markers Recent Labs  Lab 01/16/2019 1600 01/07/2019 2018  LATICACIDVEN 1.5 1.9    Cardiac Enzymes No results for input(s): TROPONINI, PROBNP in the last 168 hours.   CRITICAL CARE App cct 34 min   Richardson Landry Leven Hoel ACNP Maryanna Shape PCCM Pager [phone] till 1 pm If no answer page [phone] 01/13/2019, 9:02 AM

## 2019-01-13 NOTE — Progress Notes (Signed)
 Initial Nutrition Assessment  DOCUMENTATION CODES:   Not applicable  INTERVENTION:   Tube Feeding:  Vital High Protein at 50 ml/hr Provides 1200 kcals, 106 g of protein and 1008 mL of free water. Meets 100% of protein needs, 65% calorie needs. TF plus Cleviprex infusion currently exceeds estimated calorie needs   Recommend supplementing phosphorus; last serum phosphorus <2.0    NUTRITION DIAGNOSIS:   Inadequate oral intake related to acute illness, dysphagia, lethargy/confusion as evidenced by NPO status.   GOAL:   Patient will meet greater than or equal to 90% of their needs  MONITOR:   TF tolerance, Labs, Weight trends, Skin, Diet advancement  REASON FOR ASSESSMENT:   Consult, Ventilator Enteral/tube feeding initiation and management  ASSESSMENT:   58 yo female presents with AMS on 9/08 with right MCA aneurysm requiring surgery on 9/09, remains intubated post-op,  uncontrolled HTN on cleviprex . PMH includes HTN, anxiety   9/09 R. Frontal temporal craniotomy with clipping, evacuation of hematoma; remained intubated post-op 9/10 Extubated, NG tube remains in place  Pt not responding to verbal/tactile cues, SLP unable to evaluate. Recommend considering switching NG tube out for Cortrak tube with Bridle tomorrow  Cleviprex: 41.2 ml/hr (1978 kcals in 24 hours)  Vital High Protein infusing at 40 ml/hr via NG tube  Current wt 84.3 kg; admission weight 84 kg. No recent weight encounters. Essentially net neutral per I/O flow sheet.   Husband at bedside and reports pt has gained weight recently. Pt usually eats 1 meal a day because she "fasts." But he reports once she breaks her fast, she eats constantly.   Labs: phosphorus 1.6 (L), sodium 161  Meds: reviewed  NUTRITION - FOCUSED PHYSICAL EXAM:  Reviewed NFPE; no loss of subcutaneous fat or muscle wasting on exam   Diet Order:   Diet Order            Diet NPO time specified  Diet effective now               EDUCATION NEEDS:   Not appropriate for education at this time  Skin:  Skin Assessment: Reviewed RN Assessment  Last BM:  no documented BM  Height:   Ht Readings from Last 1 Encounters:  01/13/19 5\' 5"  (1.651 m)    Weight:   Wt Readings from Last 1 Encounters:  01/13/19 84.3 kg    Ideal Body Weight:  56.8 kg  BMI:  Body mass index is 30.93 kg/m.  Estimated Nutritional Needs:   Kcal:  1850-2050 kcals  Protein:  95-110 g  Fluid:  >/= 1.8 L    Azriel Dancy MS, RDN, LDN, CNSC [phone] Pager  [phone] Weekend/On-Call Pager

## 2019-01-13 NOTE — Progress Notes (Signed)
SLP Cancellation Note  Patient Details Name: Jessica Mayer MRN: [id number] DOB: 07-Sep-1960   Cancelled treatment:        Pt did not respond to verbal/tactile attempts to arouse for cog-speech assessment. Will continue efforts.   [place] Siren 01/13/2019, 1:04 PM   Orbie Pyo Colvin Caroli.Ed Risk analyst [phone] Office [phone]

## 2019-01-13 NOTE — Progress Notes (Signed)
Pt had R wrist restraint.  RN received verbal order for restraints this morning.  Miscommunication on putting physical order in.  Order in now.

## 2019-01-13 NOTE — Progress Notes (Signed)
eLink Physician-Brief Progress Note Patient Name: Jessica Mayer DOB: August 29, 1960 MRN: [id number]   Date of Service  01/13/2019  HPI/Events of Note  K+ 2.2, Phosphorus 1.3  eICU Interventions  KCL 30 meq iv bolus over 3 hours, Neutraphos 500 mg via NGT tid x 6 doses, continue KCL 40 meq via NGT x 2 doses, AM BMET, Phosphorus levels.        [name] 01/13/2019, 9:41 PM

## 2019-01-13 NOTE — Progress Notes (Signed)
  NEUROSURGERY PROGRESS NOTE   Pt seen and examined. Had T-C SZ last night.  EXAM: Temp:  [98.3 F (36.8 C)-100.2 F (37.9 C)] 100.2 F (37.9 C) (09/10 0803) Pulse Rate:  [66-128] 74 (09/10 1000) Resp:  [20-32] 22 (09/10 1000) BP: (103-143)/(49-88) 130/68 (09/10 1000) SpO2:  [96 %-100 %] 100 % (09/10 1000) Arterial Line BP: (126-171)/(54-82) 151/56 (09/10 1000) Weight:  [84.3 kg] 84.3 kg (09/10 0500) Intake/Output      09/09 0701 - 09/10 0700 09/10 0701 - 09/11 0700   I.V. (mL/kg) 2635 (31.3) 244.2 (2.9)   IV Piggyback 750 100   Total Intake(mL/kg) 3385 (40.2) 344.2 (4.1)   Urine (mL/kg/hr) 4150 (2.1) 350 (0.8)   Blood 400    Total Output 4550 350   Net -1165 -5.9          Grimaces to pain Pupils remain reactive bilaterally W/D RUE/RLE to pain Minimal movement LUE/LLE to pain  LABS: Lab Results  Component Value Date   CREATININE 0.93 01/25/2019   BUN 13 01/09/2019   NA 161 (HH) 01/13/2019   K 2.9 (L) 01/05/2019   CL 104 01/21/2019   CO2 22 01/18/2019   Lab Results  Component Value Date   WBC 16.4 (H) 01/13/2019   HGB 13.5 01/07/2019   HCT 40.4 02/01/2019   MCV 81.0 01/04/2019   PLT 233 01/15/2019    IMAGING: CT reviewed, demonstrates some air in the temporal and frontal hematoma cavity, partial removal of hematoma. No new hemorrhage, no HCP  IMPRESSION: - 58 y.o. female Jessica Mayer d# 4 POD#1 s/p right crani for clipping of MCA aneurysm/evac of hematoma. Overall exam is grossly unchanged although she appears more lethargic with less movement on left  PLAN: - Will get EEG today, cont Keppra - Can allow Na to normalize - SBP up to 130mmHg today - TCDs

## 2019-01-13 NOTE — Procedures (Signed)
Patient Name: Jessica Mayer  MRN: [id number]  Epilepsy Attending: Lora Havens  Referring Physician/Provider: Dr Kipp Brood Date: 01/13/2019 Duration: 23.44 mins  Patient history: 58yo F with ams and SAH s/p MCA aneurysm clipping. EEG to evaluate for seizures  Level of alertness: lethargic  AEDs during EEG study: LEV  Technical aspects: This EEG study was done with scalp electrodes positioned according to the 10-20 International system of electrode placement. Electrical activity was acquired at a sampling rate of 500Hz  and reviewed with a high frequency filter of 70Hz  and a low frequency filter of 1Hz . EEG data were recorded continuously and digitally stored.   DESCRIPTION: EEG showed periodic epileptiform discharges at the arte of 1hz  in right frontocentral region. There was also continuous generalized 2-5hz  theta-delta slowing, maximal right hemisphere. Hyperventilation and photic stimulation were not performed.  IMPRESSION: This study is suggestive of epileptogenicity in right frontocentral region secondary to acute/subacute underlying abnormality. There is also cortical dysfunction in right hemisphere consistent with prior craniotomy. Additionally there is evidence of moderate to severe diffuse encephalopathy. No definite seizures were seen throughout the recording.

## 2019-01-14 ENCOUNTER — Inpatient Hospital Stay (HOSPITAL_COMMUNITY): Payer: Medicaid Other

## 2019-01-14 ENCOUNTER — Inpatient Hospital Stay: Payer: Self-pay

## 2019-01-14 DIAGNOSIS — R569 Unspecified convulsions: Secondary | ICD-10-CM

## 2019-01-14 DIAGNOSIS — I609 Nontraumatic subarachnoid hemorrhage, unspecified: Secondary | ICD-10-CM

## 2019-01-14 DIAGNOSIS — G40901 Epilepsy, unspecified, not intractable, with status epilepticus: Secondary | ICD-10-CM

## 2019-01-14 LAB — POCT I-STAT EG7
Acid-Base Excess: 1 mmol/L (ref 0.0–2.0)
Bicarbonate: 26.1 mmol/L (ref 20.0–28.0)
Calcium, Ion: 1.23 mmol/L (ref 1.15–1.40)
HCT: 25 % — ABNORMAL LOW (ref 36.0–46.0)
Hemoglobin: 8.5 g/dL — ABNORMAL LOW (ref 12.0–15.0)
O2 Saturation: 87 %
Patient temperature: 98.5
Potassium: 3.6 mmol/L (ref 3.5–5.1)
Sodium: 167 mmol/L (ref 135–145)
TCO2: 27 mmol/L (ref 22–32)
pCO2, Ven: 44.1 mmHg (ref 44.0–60.0)
pH, Ven: 7.379 (ref 7.250–7.430)
pO2, Ven: 54 mmHg — ABNORMAL HIGH (ref 32.0–45.0)

## 2019-01-14 LAB — CBC WITH DIFFERENTIAL/PLATELET
Abs Immature Granulocytes: 0.1 10*3/uL — ABNORMAL HIGH (ref 0.00–0.07)
Basophils Absolute: 0 10*3/uL (ref 0.0–0.1)
Basophils Relative: 0 %
Eosinophils Absolute: 0 10*3/uL (ref 0.0–0.5)
Eosinophils Relative: 0 %
HCT: 29.1 % — ABNORMAL LOW (ref 36.0–46.0)
Hemoglobin: 9.2 g/dL — ABNORMAL LOW (ref 12.0–15.0)
Immature Granulocytes: 1 %
Lymphocytes Relative: 14 %
Lymphs Abs: 2.1 10*3/uL (ref 0.7–4.0)
MCH: 26.7 pg (ref 26.0–34.0)
MCHC: 31.6 g/dL (ref 30.0–36.0)
MCV: 84.3 fL (ref 80.0–100.0)
Monocytes Absolute: 1 10*3/uL (ref 0.1–1.0)
Monocytes Relative: 6 %
Neutro Abs: 12.2 10*3/uL — ABNORMAL HIGH (ref 1.7–7.7)
Neutrophils Relative %: 79 %
Platelets: 177 10*3/uL (ref 150–400)
RBC: 3.45 MIL/uL — ABNORMAL LOW (ref 3.87–5.11)
RDW: 13.5 % (ref 11.5–15.5)
WBC: 15.5 10*3/uL — ABNORMAL HIGH (ref 4.0–10.5)
nRBC: 0.1 % (ref 0.0–0.2)

## 2019-01-14 LAB — MAGNESIUM: Magnesium: 1.8 mg/dL (ref 1.7–2.4)

## 2019-01-14 LAB — BASIC METABOLIC PANEL
BUN: 7 mg/dL (ref 6–20)
CO2: 25 mmol/L (ref 22–32)
Calcium: 8.6 mg/dL — ABNORMAL LOW (ref 8.9–10.3)
Chloride: >130 mmol/L (ref 98–111)
Creatinine, Ser: 0.6 mg/dL (ref 0.44–1.00)
GFR calc Af Amer: >60 mL/min (ref >60)
GFR calc non Af Amer: >60 mL/min (ref >60)
Glucose, Bld: 166 mg/dL — ABNORMAL HIGH (ref 70–99)
Potassium: 2.5 mmol/L — CL (ref 3.5–5.1)
Sodium: 164 mmol/L (ref 135–145)

## 2019-01-14 LAB — GLUCOSE, CAPILLARY
Glucose-Capillary: 162 mg/dL — ABNORMAL HIGH (ref 70–99)
Glucose-Capillary: 146 mg/dL — ABNORMAL HIGH (ref 70–99)
Glucose-Capillary: 110 mg/dL — ABNORMAL HIGH (ref 70–99)
Glucose-Capillary: 202 mg/dL — ABNORMAL HIGH (ref 70–99)
Glucose-Capillary: 197 mg/dL — ABNORMAL HIGH (ref 70–99)
Glucose-Capillary: 243 mg/dL — ABNORMAL HIGH (ref 70–99)

## 2019-01-14 LAB — PHOSPHORUS: Phosphorus: 1.8 mg/dL — ABNORMAL LOW (ref 2.5–4.6)

## 2019-01-14 LAB — SODIUM
Sodium: 163 mmol/L (ref 135–145)
Sodium: 170 mmol/L (ref 135–145)

## 2019-01-14 MED ORDER — NOREPINEPHRINE 16 MG/250ML-% IV SOLN
0.0000 ug/min | INTRAVENOUS | Status: DC
  Administered 2019-01-14: 20 ug/min via INTRAVENOUS
  Administered 2019-01-15: 23:00:00 30 ug/min via INTRAVENOUS
  Administered 2019-01-15: 40 ug/min via INTRAVENOUS
  Administered 2019-01-15: 39 ug/min via INTRAVENOUS
  Administered 2019-01-15: 40 ug/min via INTRAVENOUS
  Administered 2019-01-16: 15:00:00 16 ug/min via INTRAVENOUS
  Administered 2019-01-17: 16:00:00 10 ug/min via INTRAVENOUS
  Administered 2019-01-19 – 2019-01-20 (×2): 18 ug/min via INTRAVENOUS
  Administered 2019-01-21: 26 ug/min via INTRAVENOUS
  Administered 2019-01-21: 11:00:00 30 ug/min via INTRAVENOUS
  Administered 2019-01-21: 20:00:00 36 ug/min via INTRAVENOUS
  Administered 2019-01-22: 04:00:00 38 ug/min via INTRAVENOUS
  Administered 2019-01-22: 11:00:00 37.973 ug/min via INTRAVENOUS
  Administered 2019-01-22: 18:00:00 38 ug/min via INTRAVENOUS
  Administered 2019-01-23 (×3): 40 ug/min via INTRAVENOUS
  Administered 2019-01-23: 38 ug/min via INTRAVENOUS
  Administered 2019-01-24 – 2019-01-25 (×2): 40 ug/min via INTRAVENOUS
  Administered 2019-01-25: 23:00:00 36 ug/min via INTRAVENOUS
  Administered 2019-01-26: 09:00:00 38 ug/min via INTRAVENOUS
  Filled 2019-01-14 (×29): qty 250

## 2019-01-14 MED ORDER — SODIUM CHLORIDE 0.9 % IV SOLN
250.0000 mL | INTRAVENOUS | Status: DC
  Administered 2019-01-14: 16:00:00 250 mL via INTRAVENOUS
  Administered 2019-01-20: 500 mL via INTRAVENOUS

## 2019-01-14 MED ORDER — ETOMIDATE 2 MG/ML IV SOLN
INTRAVENOUS | Status: AC
  Administered 2019-01-14: 15:00:00 20 mg via INTRAVENOUS
  Filled 2019-01-14: qty 10

## 2019-01-14 MED ORDER — LORAZEPAM 2 MG/ML IJ SOLN
4.0000 mg | Freq: Once | INTRAMUSCULAR | Status: AC
  Administered 2019-01-14: 4 mg via INTRAVENOUS

## 2019-01-14 MED ORDER — VALPROATE SODIUM 500 MG/5ML IV SOLN
500.0000 mg | Freq: Three times a day (TID) | INTRAVENOUS | Status: DC
  Administered 2019-01-14 – 2019-01-15 (×3): 500 mg via INTRAVENOUS
  Filled 2019-01-14 (×5): qty 5

## 2019-01-14 MED ORDER — VASOPRESSIN 20 UNIT/ML IV SOLN
0.0300 [IU]/min | INTRAVENOUS | Status: DC
  Administered 2019-01-14: 0.03 [IU]/min via INTRAVENOUS
  Filled 2019-01-14: qty 2

## 2019-01-14 MED ORDER — LACTATED RINGERS IV BOLUS
1000.0000 mL | Freq: Once | INTRAVENOUS | Status: AC
  Administered 2019-01-14: 1000 mL via INTRAVENOUS

## 2019-01-14 MED ORDER — VALPROATE SODIUM 500 MG/5ML IV SOLN
2000.0000 mg | Freq: Once | INTRAVENOUS | Status: AC
  Administered 2019-01-14: 2000 mg via INTRAVENOUS
  Filled 2019-01-14: qty 20

## 2019-01-14 MED ORDER — LEVETIRACETAM IN NACL 1500 MG/100ML IV SOLN
1500.0000 mg | Freq: Two times a day (BID) | INTRAVENOUS | Status: DC
  Administered 2019-01-14 – 2019-01-16 (×5): 1500 mg via INTRAVENOUS
  Filled 2019-01-14 (×6): qty 100

## 2019-01-14 MED ORDER — ORAL CARE MOUTH RINSE
15.0000 mL | OROMUCOSAL | Status: DC
  Administered 2019-01-14 – 2019-01-26 (×121): 15 mL via OROMUCOSAL

## 2019-01-14 MED ORDER — MIDAZOLAM 50MG/50ML (1MG/ML) PREMIX INFUSION
0.0000 mg/h | INTRAVENOUS | Status: DC
  Administered 2019-01-14 – 2019-01-15 (×3): 5 mg/h via INTRAVENOUS
  Filled 2019-01-14 (×3): qty 50

## 2019-01-14 MED ORDER — LORAZEPAM 2 MG/ML IJ SOLN
INTRAMUSCULAR | Status: AC
  Administered 2019-01-14: 14:00:00 4 mg
  Filled 2019-01-14: qty 2

## 2019-01-14 MED ORDER — SODIUM CHLORIDE 0.9 % IV SOLN
2.0000 ug | Freq: Once | INTRAVENOUS | Status: AC
  Administered 2019-01-14: 2 ug via INTRAVENOUS
  Filled 2019-01-14: qty 0.5

## 2019-01-14 MED ORDER — PHENYLEPHRINE HCL-NACL 40-0.9 MG/250ML-% IV SOLN
0.0000 ug/min | INTRAVENOUS | Status: DC
  Administered 2019-01-14 – 2019-01-15 (×7): 200 ug/min via INTRAVENOUS
  Administered 2019-01-15: 05:00:00 170 ug/min via INTRAVENOUS
  Administered 2019-01-15 – 2019-01-16 (×7): 200 ug/min via INTRAVENOUS
  Administered 2019-01-17: 10:00:00 25 ug/min via INTRAVENOUS
  Administered 2019-01-17: 150 ug/min via INTRAVENOUS
  Administered 2019-01-17: 16:00:00 300 ug/min via INTRAVENOUS
  Administered 2019-01-18 (×2): 180 ug/min via INTRAVENOUS
  Administered 2019-01-18: 22:00:00 200 ug/min via INTRAVENOUS
  Administered 2019-01-18: 09:00:00 160 ug/min via INTRAVENOUS
  Administered 2019-01-18: 01:00:00 180 ug/min via INTRAVENOUS
  Administered 2019-01-18 – 2019-01-24 (×34): 200 ug/min via INTRAVENOUS
  Administered 2019-01-24 – 2019-01-25 (×8): 400 ug/min via INTRAVENOUS
  Filled 2019-01-14 (×83): qty 250

## 2019-01-14 MED ORDER — CHLORHEXIDINE GLUCONATE 0.12% ORAL RINSE (MEDLINE KIT)
15.0000 mL | Freq: Two times a day (BID) | OROMUCOSAL | Status: DC
  Administered 2019-01-14 – 2019-01-18 (×8): 15 mL via OROMUCOSAL

## 2019-01-14 MED ORDER — VECURONIUM BROMIDE 10 MG IV SOLR
10.0000 mg | Freq: Once | INTRAVENOUS | Status: AC
  Administered 2019-01-14: 15:00:00 10 mg via INTRAVENOUS

## 2019-01-14 MED ORDER — POTASSIUM CHLORIDE 2 MEQ/ML IV SOLN
INTRAVENOUS | Status: DC
  Administered 2019-01-14 – 2019-01-16 (×5): via INTRAVENOUS
  Filled 2019-01-14 (×7): qty 1000

## 2019-01-14 MED ORDER — LEVETIRACETAM IN NACL 1000 MG/100ML IV SOLN
1000.0000 mg | Freq: Two times a day (BID) | INTRAVENOUS | Status: DC
  Administered 2019-01-14: 09:00:00 1000 mg via INTRAVENOUS
  Filled 2019-01-14: qty 100

## 2019-01-14 MED ORDER — SODIUM CHLORIDE 0.9 % IV SOLN: INTRAVENOUS | Status: DC | PRN

## 2019-01-14 MED ORDER — LORAZEPAM 2 MG/ML IJ SOLN: 4.0000 mg | Freq: Once | INTRAMUSCULAR | Status: DC

## 2019-01-14 MED ORDER — PHENYLEPHRINE HCL-NACL 10-0.9 MG/250ML-% IV SOLN
25.0000 ug/min | INTRAVENOUS | Status: DC
  Administered 2019-01-14: 14:00:00 150 ug/min via INTRAVENOUS
  Administered 2019-01-14: 200 ug/min via INTRAVENOUS
  Administered 2019-01-14: 12:00:00 25 ug/min via INTRAVENOUS
  Filled 2019-01-14 (×3): qty 250

## 2019-01-14 MED ORDER — LEVETIRACETAM IN NACL 1000 MG/100ML IV SOLN: 1000.0000 mg | Freq: Once | INTRAVENOUS | Status: DC

## 2019-01-14 MED ORDER — PHENYLEPHRINE HCL-NACL 10-0.9 MG/250ML-% IV SOLN
INTRAVENOUS | Status: AC
  Administered 2019-01-14: 17:00:00 10 mg
  Filled 2019-01-14: qty 250

## 2019-01-14 MED ORDER — K PHOS MONO-SOD PHOS DI & MONO 155-852-130 MG PO TABS
500.0000 mg | ORAL_TABLET | Freq: Three times a day (TID) | ORAL | Status: DC
  Administered 2019-01-14: 500 mg

## 2019-01-14 MED ORDER — SODIUM CHLORIDE 0.9 % IV SOLN
1500.0000 mg | Freq: Once | INTRAVENOUS | Status: AC
  Administered 2019-01-14: 1500 mg via INTRAVENOUS
  Filled 2019-01-14: qty 30

## 2019-01-14 MED ORDER — SODIUM PHOSPHATES 45 MMOLE/15ML IV SOLN
20.0000 mmol | Freq: Once | INTRAVENOUS | Status: DC
  Filled 2019-01-14: qty 6.67

## 2019-01-14 MED ORDER — POTASSIUM CHLORIDE 20 MEQ PO PACK
40.0000 meq | PACK | Freq: Two times a day (BID) | ORAL | Status: AC
  Administered 2019-01-14 – 2019-01-15 (×3): 40 meq
  Filled 2019-01-14 (×2): qty 2

## 2019-01-14 MED ORDER — PHENYTOIN SODIUM 50 MG/ML IJ SOLN
100.0000 mg | Freq: Three times a day (TID) | INTRAMUSCULAR | Status: DC
  Administered 2019-01-14 – 2019-01-18 (×12): 100 mg via INTRAVENOUS
  Filled 2019-01-14 (×12): qty 2

## 2019-01-14 MED ORDER — NOREPINEPHRINE 4 MG/250ML-% IV SOLN
0.0000 ug/min | INTRAVENOUS | Status: DC
  Administered 2019-01-14: 15:00:00 10 ug/min via INTRAVENOUS
  Filled 2019-01-14: qty 250

## 2019-01-14 MED ORDER — ETOMIDATE 2 MG/ML IV SOLN
20.0000 mg | Freq: Once | INTRAVENOUS | Status: AC
  Administered 2019-01-14: 15:00:00 20 mg via INTRAVENOUS

## 2019-01-14 MED ORDER — LORAZEPAM 2 MG/ML IJ SOLN
4.0000 mg | Freq: Once | INTRAMUSCULAR | Status: AC
  Administered 2019-01-14: 4 mg via INTRAVENOUS
  Filled 2019-01-14: qty 2

## 2019-01-14 MED ORDER — KCL-LACTATED RINGERS 20 MEQ/L IV SOLN
INTRAVENOUS | Status: DC
  Administered 2019-01-14 (×2): via INTRAVENOUS
  Filled 2019-01-14 (×2): qty 1000

## 2019-01-14 MED ORDER — LACTATED RINGERS IV BOLUS
1000.0000 mL | Freq: Once | INTRAVENOUS | Status: AC
  Administered 2019-01-14: 15:00:00 1000 mL via INTRAVENOUS

## 2019-01-14 MED ORDER — VECURONIUM BROMIDE 10 MG IV SOLR
INTRAVENOUS | Status: AC
  Administered 2019-01-14: 10 mg via INTRAVENOUS
  Filled 2019-01-14: qty 10

## 2019-01-14 MED ORDER — FREE WATER
200.0000 mL | Freq: Three times a day (TID) | Status: DC
  Administered 2019-01-14 (×2): 200 mL

## 2019-01-14 MED ORDER — POTASSIUM CHLORIDE 20 MEQ/15ML (10%) PO SOLN
40.0000 meq | ORAL | Status: AC
  Administered 2019-01-14 (×3): 40 meq
  Filled 2019-01-14 (×3): qty 30

## 2019-01-14 NOTE — Procedures (Signed)
LTM reviewed till 93. Had frequent 8-12/hr seizures till 1357. No definitive seizures since.   Critical care and neurology team aware.  Please refer to full report for details.

## 2019-01-14 NOTE — Progress Notes (Signed)
CRITICAL VALUE ALERT  Critical Value:  Sodium 170  Date & Time Notied:  01/14/2019 1515  Provider Notified: Dr Lynetta Mare  Orders Received/Actions taken: New orders will be initiated.  Will continue to monitor

## 2019-01-14 NOTE — Progress Notes (Signed)
Neurology Progress Note   S:// No acute overnight events. Hooked up to LTM due to concern for seizure.  LTM shows multiple subclinical seizures Neurology called for subclinical seizures.  O:// Current vital signs: BP 132/69   Pulse 87   Temp (!) 101 F (38.3 C) (Axillary)   Resp 19   Ht 5\' 5"  (1.651 m)   Wt 86.2 kg   LMP 05/29/2010   SpO2 99%   BMI 31.62 kg/m  Vital signs in last 24 hours: Temp:  [99.3 F (37.4 C)-101 F (38.3 C)] 101 F (38.3 C) (09/11 0400) Pulse Rate:  [65-111] 87 (09/11 0700) Resp:  [19-41] 19 (09/11 0700) BP: (106-144)/(49-97) 132/69 (09/11 0700) SpO2:  [95 %-100 %] 99 % (09/11 0700) Arterial Line BP: (79-184)/(52-76) 153/64 (09/11 0700) Weight:  [86.2 kg] 86.2 kg (09/11 0500) General: Remains obtunded, not intubated. HEENT: Right eye swollen, otherwise atraumatic CVS: Regular rate rhythm Respiratory: Breathing normally and saturating well on room air Abdomen: Obese, nontender Extremities: Warm well perfused Neurological exam Obtunded, does not open eyes to voice.  Does not follow commands. Remains nonverbal. Cranial nerves: Difficult exam in the right pupil but likely 3 mm and reactive, left pupil 2 mm reactive, corneal reflexes present, does not blink to threat from either side, mild left facial weakness Motor exam: Spontaneously moving right upper and lower extremity.  Weak on the left with minimal withdrawal to noxious stimulation Sensory exam as above Coordination cannot be assessed due to mentation   Medications  Current Facility-Administered Medications:  .  0.9 %  sodium chloride infusion (Manually program via Guardrails IV Fluids), , Intravenous, Once, Moshe Salisbury, CRNA .  acetaminophen (TYLENOL) tablet 650 mg, 650 mg, Oral, Q4H PRN **OR** acetaminophen (TYLENOL) solution 650 mg, 650 mg, Per Tube, Q4H PRN, 650 mg at 01/14/19 0539 **OR** acetaminophen (TYLENOL) suppository 650 mg, 650 mg, Rectal, Q4H PRN, Amie Portland, MD .   betaxolol (BETOPTIC-S) 0.5 % ophthalmic solution 1 drop, 1 drop, Both Eyes, BID, Minor, Grace Bushy, NP, 1 drop at 01/13/19 2129 .  brinzolamide (AZOPT) 1 % ophthalmic suspension 1 drop, 1 drop, Both Eyes, TID, Minor, Grace Bushy, NP, 1 drop at 01/13/19 2128 .  chlorhexidine (PERIDEX) 0.12 % solution 15 mL, 15 mL, Mouth Rinse, BID, Amie Portland, MD, 15 mL at 01/13/19 2128 .  Chlorhexidine Gluconate Cloth 2 % PADS 6 each, 6 each, Topical, Daily, Amie Portland, MD, 6 each at 01/13/19 1700 .  [COMPLETED] labetalol (NORMODYNE) injection 20 mg, 20 mg, Intravenous, Once, 20 mg at 01/08/2019 1938 **AND** clevidipine (CLEVIPREX) infusion 0.5 mg/mL, 0-21 mg/hr, Intravenous, Continuous, Consuella Lose, MD, Last Rate: 26 mL/hr at 01/14/19 0906, 13 mg/hr at 01/14/19 0906 .  feeding supplement (VITAL HIGH PROTEIN) liquid 1,000 mL, 1,000 mL, Per Tube, Continuous, Agarwala, Ravi, MD, Last Rate: 50 mL/hr at 01/14/19 0907, 1,000 mL at 01/14/19 0907 .  labetalol (NORMODYNE) injection 10-20 mg, 10-20 mg, Intravenous, Q2H PRN, Frederik Pear, MD, 20 mg at 01/14/19 0023 .  lactated ringers infusion, , Intravenous, Continuous, Roderic Palau, MD .  lactated ringers infusion, , Intravenous, Continuous, Agarwala, Ravi, MD, Last Rate: 100 mL/hr at 01/14/19 0600 .  latanoprost (XALATAN) 0.005 % ophthalmic solution 1 drop, 1 drop, Both Eyes, QHS, Minor, Grace Bushy, NP, 1 drop at 01/13/19 2129 .  levETIRAcetam (KEPPRA) IVPB 1000 mg/100 mL premix, 1,000 mg, Intravenous, Q12H, Agarwala, Ravi, MD .  MEDLINE mouth rinse, 15 mL, Mouth Rinse, q12n4p, Amie Portland, MD, 15 mL at 01/13/19 1508 .  niMODipine (NIMOTOP) capsule 60 mg, 60 mg, Oral, Q4H **OR** niMODipine (NYMALIZE) 6 MG/ML oral solution 60 mg, 60 mg, Per Tube, Q4H, Earnie Larsson, MD, 60 mg at 01/14/19 0847 .  pantoprazole (PROTONIX) injection 40 mg, 40 mg, Intravenous, QHS, Amie Portland, MD, 40 mg at 01/13/19 2127 .  phosphorus (K PHOS NEUTRAL) tablet 500 mg, 500 mg,  Oral, TID, Ogan, [place]wo U, MD, 500 mg at 01/14/19 0020 .  potassium chloride (KLOR-CON) packet 40 mEq, 40 mEq, Oral, BID, Agarwala, Ravi, MD, 40 mEq at 01/13/19 2128 .  potassium chloride 20 MEQ/15ML (10%) solution 40 mEq, 40 mEq, Per Tube, Q4H, Ogan, Kerry Kass, MD, 40 mEq at 01/14/19 0704 .  sennosides (SENOKOT) 8.8 MG/5ML syrup 5 mL, 5 mL, Per Tube, BID, Earnie Larsson, MD, 5 mL at 01/13/19 2128 .  sodium chloride flush (NS) 0.9 % injection 10-40 mL, 10-40 mL, Intracatheter, PRN, Consuella Lose, MD .  valproate (DEPACON) 2,000 mg in dextrose 5 % 50 mL IVPB, 2,000 mg, Intravenous, Once, Agarwala, Ravi, MD .  valproate (DEPACON) 500 mg in dextrose 5 % 50 mL IVPB, 500 mg, Intravenous, Q8H, Agarwala, Einar Grad, MD Labs CBC    Component Value Date/Time   WBC 15.5 (H) 01/14/2019 0449   RBC 3.45 (L) 01/14/2019 0449   HGB 9.2 (L) 01/14/2019 0449   HCT 29.1 (L) 01/14/2019 0449   PLT 177 01/14/2019 0449   MCV 84.3 01/14/2019 0449   MCH 26.7 01/14/2019 0449   MCHC 31.6 01/14/2019 0449   RDW 13.5 01/14/2019 0449   LYMPHSABS 2.1 01/14/2019 0449   MONOABS 1.0 01/14/2019 0449   EOSABS 0.0 01/14/2019 0449   BASOSABS 0.0 01/14/2019 0449    CMP     Component Value Date/Time   NA 164 (HH) 01/14/2019 0449   K 2.5 (LL) 01/14/2019 0449   CL >130 (HH) 01/14/2019 0449   CO2 25 01/14/2019 0449   GLUCOSE 166 (H) 01/14/2019 0449   BUN 7 01/14/2019 0449   CREATININE 0.60 01/14/2019 0449   CALCIUM 8.6 (L) 01/14/2019 0449   PROT 8.2 (H) 01/17/2019 1532   ALBUMIN 4.1 01/24/2019 1532   AST 67 (H) 01/25/2019 1532   ALT 26 01/31/2019 1532   ALKPHOS 65 01/10/2019 1532   BILITOT 0.7 01/24/2019 1532   GFRNONAA >60 01/14/2019 0449   GFRAA >60 01/14/2019 0449   Imaging I have reviewed images in epic and the results pertinent to this consultation are: CT-scan of the brain- right-sided intraparenchymal hematoma and sylvian subarachnoid. 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  Status post right [place] for clipping postoperative day 2 Had a seizure like activity for which she was hooked up to LTM.  LTM shows multiple subclinical seizures overnight. Likely subclinical seizures versus status due to the subarachnoid/IPH.  Recommendations: Already on Keppra 500 twice daily IV Ativan 4 mg x 1. Additional load of Keppra 1 g now. Load with Depakote 20 mg/kg. Continue LTM TCD's per neurosurgery Seizure precautions We will follow with you.  -- Amie Portland, MD [facility] Pager: [phone] If 7pm to 7am, please call on call as listed on AMION.  CRITICAL CARE ATTESTATION Performed by: Amie Portland, MD Total critical care time: 30 minutes Critical care time was exclusive of separately billable procedures and treating other patients and/or supervising APPs/Residents/Students Critical care was necessary to treat or prevent imminent or life-threatening deterioration due to subarachnoid hemorrhage, intraparenchymal hematoma, seizures, status epilepticus  This patient is critically ill and at significant risk for neurological worsening and/or death and care requires constant monitoring. Critical care was time spent personally by me on the following activities: development of treatment plan with patient and/or surrogate as well as nursing, discussions with consultants, evaluation of patient's response to treatment, examination of patient, obtaining history from patient or surrogate, ordering and performing treatments and interventions, ordering and review of laboratory studies, ordering and review of radiographic studies, pulse oximetry, re-evaluation of patient's condition, participation in multidisciplinary rounds and medical decision making of high complexity in the care of this patient.

## 2019-01-14 NOTE — Progress Notes (Signed)
PT Cancellation Note  Patient Details Name: Jessica Mayer MRN: [id number] DOB: 08-27-1960   Cancelled Treatment:    Reason Eval/Treat Not Completed: Patient not medically ready (recurrent seizures).  Ellamae Sia, PT, DPT Acute Rehabilitation Services Pager [phone] Office [phone]    Willy Eddy 01/14/2019, 1:09 PM

## 2019-01-14 NOTE — Progress Notes (Signed)
PULMONARY / CRITICAL CARE MEDICINE   NAME:  Jessica Mayer, MRN:  [id number], DOB:  1961/01/20, LOS: 3 ADMISSION DATE:  01/28/2019, CONSULTATION DATE: 01/11/2019 REFERRING MD: Neurosurgery, CHIEF COMPLAINT: Vent dependent respiratory failure secondary to right MCA clipping and craniotomy  BRIEF HISTORY:    58 year old female with a history of noncompliance with hypertensive medication who had altered mental status for 48 hours prior to admission on 01/24/2019 found to have a right MCA aneurysm was taken to surgery on 01/24/2019 left intubated pulmonary critical care consulted. HISTORY OF PRESENT ILLNESS   58 year old female is reported to have lethargy 48 hours prior to admission on 01/16/2019.  Initially admitted by neurology treated with 3% saline and neurosurgical consult was called.  Follow-up CT arteriogram revealed a right MCA aneurysm with hematoma and she was subsequently taken to the operating room by neurosurgery on 01/10/2019 and transferred to the neurosurgical service.  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 She is left intubated per anesthesia pulmonary critical care was asked to evaluate and assist in her care on full mechanical ventilatory support. SIGNIFICANT PAST MEDICAL HISTORY   Hypertension with noncompliance of medications Glaucoma on 3 eyedrops Anxiety  SIGNIFICANT EVENTS:  01/31/2019 - right frontal temporal craniotomy with clipping of right middle cerebral aneurysm and evacuation of right temporal hematoma  STUDIES:   CT scans revealing a right MCA aneurysm and hematoma  CULTURES:  MRSA negative SARS-CoV-2 negative  ANTIBIOTICS:  None  LINES/TUBES:  01/20/2019 endotracheal tube>> 01/14/2019  CONSULTANTS:  01/27/2019 pulmonary critical care SUBJECTIVE:  58 year old female with a right MCA aneurysm status post clipping return to intensive care unit extubated.  CONSTITUTIONAL: BP 132/69   Pulse 87   Temp (!) 101 F (38.3  C) (Axillary)   Resp 19   Ht 5\' 5"  (1.651 m)   Wt 86.2 kg   LMP 05/29/2010   SpO2 99%   BMI 31.62 kg/m   I/O last 3 completed shifts: In: 5505.6 [I.V.:3889.2; NG/GT:947.7; IV Piggyback:668.7] Out: 4650 [Urine:4275; Emesis/NG output:375]        PHYSICAL EXAM: General: Female who is poorly responsive. HEENT: Right cranial dressing dry and intact.  Right periorbital edema noted pupils equal reactive light Neuro: Poorly responsive.  Respirations with mild gurgling.  Moves right side spontaneously increased with pain CV: Heart sounds are regular rate and rhythm PULM: Shallow respirations.  Chest x-ray 01/14/2019 with low lung volumes. GI: soft, bsx4 active  Extremities: warm/dry, negative edema  Skin: no rashes or lesions    RESOLVED PROBLEM LIST   ASSESSMENT AND PLAN   Critically ill due to uncontrolled hypertension in the context of recently secured aneurysm requiring titration of Cleviprex. Goal systolic blood pressure is 160 per neurosurgery Treatment   Critically due to encephalopathy associated with right MCA aneurysm with rupture with associated cerebral edema requiring frequent neurochecks and titration of hyperosmolar therapy. Electrolyte disturbances Minimize sedation Remains at risk for airway compromise Sodium 164 on 01/14/2019 continue to monitor not currently on 3% saline Phosphorus potassium repleted 01/14/2019  History of glaucoma  On glucoma medications restarted 01/13/2019  SUMMARY OF TODAY'S PLAN:  01/13/2019 patient underwent aneurysm clipping 01/10/2019 return to the ICU extubated.  He is hypertensive.  She is unable to eat at this time due to altered mental status.  Continue to monitor in ICU.  Best Practice / Goals of Care / Disposition.   DVT PROPHYLAXIS:SCD SUP: PPI NUTRITION: N.p.o. MOBILITY: Bedrest GOALS OF CARE: Full code FAMILY  DISCUSSIONS: Family updated per neurosurgery DISPOSITION remains extubated to intensive care unit  LABS   Glucose Recent Labs  Lab 01/13/19 1210 01/13/19 1608 01/13/19 1942 01/13/19 2320 01/14/19 0326 01/14/19 0834  GLUCAP 192* 156* 150* 143* 162* 146*   Ct Head Wo Contrast  Result Date: 02/02/2019 CLINICAL DATA:  Right MCA aneurysm clipping today.  Vasospasm. EXAM: CT HEAD WITHOUT CONTRAST TECHNIQUE: Contiguous axial images were obtained from the base of the skull through the vertex without intravenous contrast. COMPARISON:  CT head 01/25/2019 FINDINGS: Brain: Interval right-sided craniotomy. Aneurysm clip is present in the right middle cerebral artery region. Partial evacuation of the right-sided hematoma particularly the anterior portion. Edema surrounding the hematoma is similar to earlier today. There is now gas within the hematoma anteriorly related to dissection for aneurysm clipping. Anterior subdural pneumocephalus bilaterally. Small amount of blood layering in the left occipital horn unchanged. There is compression of the right lateral ventricle. 4 mm midline shift to the left similar to earlier today on the preoperative CT. Vascular: Right MCA aneurysm clipping. Skull: Right pterional craniotomy extending into the orbit on the right. There is gas in the right orbit. Sinuses/Orbits: Mild mucosal edema paranasal sinuses. Craniotomy extends into the right superior orbit. Moderate amount of gas within the orbit. NG tube on the left. Other: None IMPRESSION: 1. Postop right pterional craniotomy for right MCA aneurysm clipping. Hematoma in the right sylvian fissure has been partially evacuated. Surrounding edema unchanged. No new hemorrhage since earlier this morning. 4 mm midline shift similar to earlier today 2. Right pterional craniotomy extending into the right orbit with right orbital gas and soft tissue swelling. Electronically Signed   By: Franchot Gallo M.D.   On: 01/06/2019 18:35   Dg Chest Port 1 View  Result Date: 01/14/2019 CLINICAL DATA:  Follow-up CVA. EXAM: PORTABLE CHEST 1 VIEW  COMPARISON:  Multiple prior chest films. The most recent is 01/25/2019 FINDINGS: There is a new NG tube coursing down the esophagus and into the stomach. The cardiac silhouette, mediastinal and hilar contours are stable given the AP projection and portable technique. Low lung volumes with vascular crowding and streaky atelectasis. IMPRESSION: 1. Low lung volumes with vascular crowding and atelectasis. 2. NG tube in good position. Electronically Signed   By: Marijo Sanes M.D.   On: 01/14/2019 08:43    BMET Recent Labs  Lab 01/22/2019 1532  01/13/19 1508 01/13/19 2026 01/14/19 0204 01/14/19 0449  NA 141   < > 163* 162* 163* 164*  K 2.9*  --  2.2*  --   --  2.5*  CL 104  --  >130*  --   --  >130*  CO2 22  --  21*  --   --  25  BUN 13  --  7  --   --  7  CREATININE 0.93  --  0.70  --   --  0.60  GLUCOSE 123*  --  197*  --   --  166*   < > = values in this interval not displayed.    Liver Enzymes Recent Labs  Lab 01/18/2019 1532  AST 67*  ALT 26  ALKPHOS 65  BILITOT 0.7  ALBUMIN 4.1    Electrolytes Recent Labs  Lab 01/18/2019 1532  01/13/19 0430 01/13/19 1508 01/14/19 0449  CALCIUM 9.7  --   --  8.1* 8.6*  MG  --    < > 1.9 1.9 1.8  PHOS  --    < > 1.6*  1.3* 1.8*   < > = values in this interval not displayed.    CBC Recent Labs  Lab 01/19/2019 1532 01/14/19 0449  WBC 16.4* 15.5*  HGB 13.5 9.2*  HCT 40.4 29.1*  PLT 233 177    ABG No results for input(s): PHART, PCO2ART, PO2ART in the last 168 hours.  Coag's Recent Labs  Lab 01/10/2019 1532  INR 1.1    Sepsis Markers Recent Labs  Lab 01/06/2019 1600 02/01/2019 2018  LATICACIDVEN 1.5 1.9    Cardiac Enzymes No results for input(s): TROPONINI, PROBNP in the last 168 hours.   CRITICAL CARE App cct 35 min   Richardson Landry Mahamadou Weltz ACNP Maryanna Shape PCCM Pager [phone] till 1 pm If no answer page [phone] 01/14/2019, 8:51 AM

## 2019-01-14 NOTE — Progress Notes (Signed)
Multiple seizures.  Focal status Loaded with Dilantin. No seizure from 1356 hrs. onwards till now. Patient intubated Versed for sedation.  Goal would be seizure control and not bar suppression because she still has the irritant i.e. the bleed/irritated area from the bleed present.  Putting her number suppression might not achieve anything because as soon as she is taken off of birth suppression, the focal status might go back in this focal status.  Discussed with Dr. Lynetta Mare and Dr. Hortense Ramal.  All 3 of [place] agree.  We will continue to follow  -- Amie Portland, MD [facility] Pager: [phone] If 7pm to 7am, please call on call as listed on AMION.

## 2019-01-14 NOTE — Procedures (Signed)
Cortrak  Person Inserting Tube:  Jessica Mayer, RD Tube Type:  Cortrak - 43 inches Tube Location:  Left nare Initial Placement:  Stomach Secured by: Bridle Technique Used to Measure Tube Placement:  Documented cm marking at nare/ corner of mouth Cortrak Secured At:  69 cm Procedure Comments:  Cortrak Tube Team Note:  Consult received to place a Cortrak feeding tube.   No x-ray is required. RN may begin using tube.   If the tube becomes dislodged please keep the tube and contact the Cortrak team at [email or web address] ([email or web address]) for replacement.  If after hours and replacement cannot be delayed, place a NG tube and confirm placement with an abdominal x-ray.    [employer] MS, RDN, LDN, CNSC [phone] Pager  [phone] Weekend/On-Call Pager

## 2019-01-14 NOTE — Progress Notes (Addendum)
SLP Cancellation Note  Patient Details Name: Mckenzy Becerril MRN: [id number] DOB: Aug 20, 1960   Cancelled treatment:       Reason Eval/Treat Not Completed: Fatigue/lethargy limiting ability to participate(Pt was approached for speech/language/cognition evaluation but she was unable to demonstrate an adequate level of alertness to participate despite verbal and tactile stimulation. SLP will follow up and re-attempt on a subsequent date.)  Ying Blankenhorn I. Hardin Negus, [place], [place] Office number [phone] Pager [place] 01/14/2019, 10:34 AM

## 2019-01-14 NOTE — Procedures (Signed)
Patient Name: Jessica Mayer  MRN: [id number]  Epilepsy Attending: Lora Havens  Referring Physician/Provider: Dr Kipp Brood Duration: 01/13/2019 1157 to 01/14/2019 1157  Patient history: 58yo F with ams and SAH s/p MCA aneurysm clipping. EEG to evaluate for seizures  Level of alertness: lethargic  AEDs during EEG study: LEV, VPA  Technical aspects: This EEG study was done with scalp electrodes positioned according to the 10-20 International system of electrode placement. Electrical activity was acquired at a sampling rate of 500Hz  and reviewed with a high frequency filter of 70Hz  and a low frequency filter of 1Hz . EEG data were recorded continuously and digitally stored.   DESCRIPTION: EEG initally showed periodic epileptiform discharges at the rate of 1hz  in right frontocentral region. Around 1900, patient started having seizures with no clinical signs. Seizures were arising from right frontocentral region, avg 8-10/hour, lasting 1-1.5 minutes. There was also continuous generalized 2-5hz  theta-delta slowing, maximal right hemisphere. Hyperventilation and photic stimulation were not performed.  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 There is also cortical dysfunction in right hemisphere consistent with prior craniotomy. Additionally there is evidence of moderate to severe diffuse encephalopathy.   Treating provider was notified.    Seizure onset     Seizure progression   Seizure end   [place]

## 2019-01-14 NOTE — Progress Notes (Signed)
[facility] Progress Note Patient Name: Emmalynne Mendyk DOB: 1961-04-23 MRN: [id number]   Date of Service  01/14/2019  HPI/Events of Note  Hypokalemia and hypophosphatemia  eICU Interventions  Elink electrolyte replacement protocol for K+ and phosphorous.        Kerry Kass Christell Steinmiller 01/14/2019, 6:55 AM

## 2019-01-14 NOTE — Progress Notes (Signed)
Consent obtained from husband.  Carolee Rota, RN VAST

## 2019-01-14 NOTE — Procedures (Signed)
Central Venous Catheter Insertion Procedure Note Jessica Mayer [id number] 09/21/1960   Indication: vasopressor therapy  Consent obtained: emergent procedure  Preprocedural timeout called: yes  Full aseptic bundle used: yes   Ultrasound guidance: yes   Line inserted: 7.24F triple lumen  Insertion depth: 19cm  Vessel used: left subclavian  Number of attempts: 2  Line placement confirmation: guidewire in vessel. Positive contrast in RV.  X-ray performed: pending.  Kipp Brood, MD [facility] ICU Physician [place]  Pager: [phone] Mobile: [phone] After hours: [phone].  06/28/2018, 6:28 PM   Kipp Brood 01/14/2019, 4:18 PM

## 2019-01-14 NOTE — Progress Notes (Signed)
EEG maintenance complete. FP1 FP2 F7 F8 and ground. No skin breakdown seen. Continue to montior

## 2019-01-14 NOTE — Progress Notes (Signed)
  NEUROSURGERY PROGRESS NOTE   Pt seen and examined. LTM placed yesterday.  EXAM: Temp:  [99.3 F (37.4 C)-101 F (38.3 C)] 100.1 F (37.8 C) (09/11 0900) Pulse Rate:  [70-111] 97 (09/11 1000) Resp:  [19-41] 28 (09/11 1000) BP: (106-144)/(52-97) 119/65 (09/11 1000) SpO2:  [95 %-100 %] 99 % (09/11 1000) Arterial Line BP: (79-184)/(52-76) 166/69 (09/11 0900) Weight:  [86.2 kg] 86.2 kg (09/11 0500) Intake/Output      09/10 0701 - 09/11 0700 09/11 0701 - 09/12 0700   I.V. (mL/kg) 2617.3 (30.4)    NG/GT 947.7    IV Piggyback 268.7    Total Intake(mL/kg) 3833.6 (44.5)    Urine (mL/kg/hr) 2425 (1.2) 375 (0.9)   Emesis/NG output 375    Stool 0    Blood     Total Output 2800 375   Net +1033.6 -375        Stool Occurrence 1 x     Grimaces to pain Pupils reactive Snoring respirations W/D to noxious stim RUE/RLE Minimal movement LUE/LLE Wound dressing c/d/i  LABS: Lab Results  Component Value Date   CREATININE 0.60 01/14/2019   BUN 7 01/14/2019   NA 164 (HH) 01/14/2019   K 2.5 (LL) 01/14/2019   CL >130 (HH) 01/14/2019   CO2 25 01/14/2019   Lab Results  Component Value Date   WBC 15.5 (H) 01/14/2019   HGB 9.2 (L) 01/14/2019   HCT 29.1 (L) 01/14/2019   MCV 84.3 01/14/2019   PLT 177 01/14/2019    IMPRESSION: - 58 y.o. female SAH d# 5, POD#2 s/p RMCA aneurysm clipping, more somnolent today with EEG demonstrating subclinical SZ  PLAN: - May need intubation/burst suppression if she cont to have SZ despite now 3 AED - per neurology/PCCM - TCD today - Can allow SBP to autoregulate, no anti-hypertensives needed - Add free H2O 200cc q8hrs for hypernatremia

## 2019-01-14 NOTE — Progress Notes (Addendum)
Critical Care Attending.  Patient remains encephalopathic following SAH. LTM EEG shows recurrent partial seizures. Initially give 4 mg of lorazepam and loaded with leviracetam and valproate. No improvement in seizures. Given further 4mg  of lorazepam and loaded with phenytoin.  Continued to have encephalopathy with poor mental status and high risk of airway compromise and inadequate seizure control.  Intubated for airway protection and to allow for aggressive seizure suppression.   Intermittent hypotension despite stopping cleviprex. Started on phenylephrine and levophed and given fluid boluses to augment BP. TCD's normal but inadequate windows to exclude vasospasm.  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 Given DDVAP Serial BMET ordered.  EEG post intubation shows suppression of the frontal focus of rhythmic activity.  I performed a CCM echo which showed LVH with normal/hyperdynamic LV function. Small/underfilled LV cavity. RV function and size normal. No significant valvular lesions. IVC small with normal respiratory variation, estimated RAP 5-10  Critically ill due to respiratory failure requiring mechanical ventilation.  Critically ill due to status epilepticus requiring titration of sedative infusions. Critically ill due to potential of vasospasm requiring fluid resuscitation and titration of vasopressors.  CRITICAL CARE Performed by: Kipp Brood   Total critical care time: 60 minutes  Critical care time was exclusive of separately billable procedures and treating other patients.  Critical care was necessary to treat or prevent imminent or life-threatening deterioration.  Critical care was time spent personally by me on the following activities: development of treatment plan with patient and/or surrogate as well as nursing, discussions with consultants, evaluation of patient's response to treatment, examination of patient, obtaining history from patient  or surrogate, ordering and performing treatments and interventions, ordering and review of laboratory studies, ordering and review of radiographic studies, pulse oximetry, re-evaluation of patient's condition and participation in multidisciplinary rounds.  Kipp Brood, MD [facility] ICU Physician [place]  Pager: [phone] Mobile: [phone] After hours: [phone].  01/14/2019, 4:34 PM

## 2019-01-14 NOTE — Procedures (Signed)
Intubation Procedure Note  Jessica Mayer  [id number] 1960-07-01   Procedure: Intubation Indications: Airway protection and maintenance  Procedure Details Consent: Unable to obtain consent because of altered level of consciousness. Time Out: Verified patient identification, verified procedure, site/side was marked, verified correct patient position, special equipment/implants available, medications/allergies/relevent history reviewed, required imaging and test results available.  Performed  Pre-oxygenation: 100% via BVM Premedication: none Position: supine Induction agent: etomidate 8m Paralytic: vercuronium 19mTechnique: Glidescope MAC 3  Tube size: 7.9m64maryngoscopy view: 1 Number of attempts: 1 Insertion depth: 22cm at lower incisors Tube secured: tube holder Other findings:   OGT/NGT inserted: no Position confirmed by auscultation: n/a  Evaluation Colorimetric change: yes Bilateral breath sounds: yes Hemodynamic Status: Transient hypotension treated with pressors and treated with fluid; O2 sats: stable throughout Patient's Current Condition: stable Complications: No apparent complications Patient did tolerate procedure well. Chest X-ray ordered to verify placement.  CXR: pending.   RavEinar Gradarwala 03/17/2018   RavKipp Brood11/2020

## 2019-01-14 NOTE — Progress Notes (Signed)
Aline attempted by 3 RT's and MD. Hold off on aline at this time.

## 2019-01-15 ENCOUNTER — Inpatient Hospital Stay (HOSPITAL_COMMUNITY): Payer: Medicaid Other

## 2019-01-15 LAB — BASIC METABOLIC PANEL
Anion gap: 10 (ref 5–15)
Anion gap: 10 (ref 5–15)
Anion gap: 10 (ref 5–15)
BUN: 8 mg/dL (ref 6–20)
BUN: 8 mg/dL (ref 6–20)
BUN: 9 mg/dL (ref 6–20)
CO2: 25 mmol/L (ref 22–32)
CO2: 26 mmol/L (ref 22–32)
CO2: 26 mmol/L (ref 22–32)
Calcium: 8.4 mg/dL — ABNORMAL LOW (ref 8.9–10.3)
Calcium: 8.7 mg/dL — ABNORMAL LOW (ref 8.9–10.3)
Calcium: 8.9 mg/dL (ref 8.9–10.3)
Chloride: 125 mmol/L — ABNORMAL HIGH (ref 98–111)
Chloride: 124 mmol/L — ABNORMAL HIGH (ref 98–111)
Chloride: 122 mmol/L — ABNORMAL HIGH (ref 98–111)
Creatinine, Ser: 0.7 mg/dL (ref 0.44–1.00)
Creatinine, Ser: 0.69 mg/dL (ref 0.44–1.00)
Creatinine, Ser: 0.65 mg/dL (ref 0.44–1.00)
GFR calc Af Amer: >60 mL/min (ref >60)
GFR calc Af Amer: >60 mL/min (ref >60)
GFR calc Af Amer: >60 mL/min (ref >60)
GFR calc non Af Amer: >60 mL/min (ref >60)
GFR calc non Af Amer: >60 mL/min (ref >60)
GFR calc non Af Amer: >60 mL/min (ref >60)
Glucose, Bld: 300 mg/dL — ABNORMAL HIGH (ref 70–99)
Glucose, Bld: 295 mg/dL — ABNORMAL HIGH (ref 70–99)
Glucose, Bld: 221 mg/dL — ABNORMAL HIGH (ref 70–99)
Potassium: 3.8 mmol/L (ref 3.5–5.1)
Potassium: 3.8 mmol/L (ref 3.5–5.1)
Potassium: 3.3 mmol/L — ABNORMAL LOW (ref 3.5–5.1)
Sodium: 160 mmol/L — ABNORMAL HIGH (ref 135–145)
Sodium: 160 mmol/L — ABNORMAL HIGH (ref 135–145)
Sodium: 158 mmol/L — ABNORMAL HIGH (ref 135–145)

## 2019-01-15 LAB — GLUCOSE, CAPILLARY
Glucose-Capillary: 168 mg/dL — ABNORMAL HIGH (ref 70–99)
Glucose-Capillary: 181 mg/dL — ABNORMAL HIGH (ref 70–99)
Glucose-Capillary: 194 mg/dL — ABNORMAL HIGH (ref 70–99)
Glucose-Capillary: 205 mg/dL — ABNORMAL HIGH (ref 70–99)
Glucose-Capillary: 224 mg/dL — ABNORMAL HIGH (ref 70–99)
Glucose-Capillary: 250 mg/dL — ABNORMAL HIGH (ref 70–99)

## 2019-01-15 LAB — MAGNESIUM: Magnesium: 1.6 mg/dL — ABNORMAL LOW (ref 1.7–2.4)

## 2019-01-15 LAB — SODIUM: Sodium: 159 mmol/L — ABNORMAL HIGH (ref 135–145)

## 2019-01-15 LAB — VALPROIC ACID LEVEL: Valproic Acid Lvl: 55 ug/mL (ref 50.0–100.0)

## 2019-01-15 LAB — PHOSPHORUS: Phosphorus: 2.7 mg/dL (ref 2.5–4.6)

## 2019-01-15 MED ORDER — FREE WATER
400.0000 mL | Freq: Four times a day (QID) | Status: DC
  Administered 2019-01-15: 04:00:00 400 mL

## 2019-01-15 MED ORDER — PRO-STAT SUGAR FREE PO LIQD
30.0000 mL | Freq: Two times a day (BID) | ORAL | Status: DC
  Administered 2019-01-15 – 2019-01-20 (×11): 30 mL
  Filled 2019-01-15 (×11): qty 30

## 2019-01-15 MED ORDER — INSULIN ASPART 100 UNIT/ML ~~LOC~~ SOLN
0.0000 [IU] | SUBCUTANEOUS | Status: DC
  Administered 2019-01-15: 3 [IU] via SUBCUTANEOUS
  Administered 2019-01-15 (×2): 5 [IU] via SUBCUTANEOUS
  Administered 2019-01-15 (×2): 3 [IU] via SUBCUTANEOUS
  Administered 2019-01-15: 09:00:00 5 [IU] via SUBCUTANEOUS
  Administered 2019-01-16: 3 [IU] via SUBCUTANEOUS
  Administered 2019-01-16 (×2): 2 [IU] via SUBCUTANEOUS
  Administered 2019-01-16: 04:00:00 3 [IU] via SUBCUTANEOUS
  Administered 2019-01-16: 5 [IU] via SUBCUTANEOUS
  Administered 2019-01-17 (×2): 3 [IU] via SUBCUTANEOUS
  Administered 2019-01-17 – 2019-01-18 (×2): 5 [IU] via SUBCUTANEOUS
  Administered 2019-01-18: 01:00:00 3 [IU] via SUBCUTANEOUS
  Administered 2019-01-18 (×2): 2 [IU] via SUBCUTANEOUS
  Administered 2019-01-18: 3 [IU] via SUBCUTANEOUS
  Administered 2019-01-19 (×3): 2 [IU] via SUBCUTANEOUS
  Administered 2019-01-19 (×2): 3 [IU] via SUBCUTANEOUS
  Administered 2019-01-20 (×3): 2 [IU] via SUBCUTANEOUS
  Administered 2019-01-20: 3 [IU] via SUBCUTANEOUS
  Administered 2019-01-20 – 2019-01-21 (×5): 2 [IU] via SUBCUTANEOUS
  Administered 2019-01-21 – 2019-01-22 (×2): 3 [IU] via SUBCUTANEOUS
  Administered 2019-01-22: 08:00:00 2 [IU] via SUBCUTANEOUS
  Administered 2019-01-22 (×3): 3 [IU] via SUBCUTANEOUS
  Administered 2019-01-22: 2 [IU] via SUBCUTANEOUS
  Administered 2019-01-23: 3 [IU] via SUBCUTANEOUS
  Administered 2019-01-23: 17:00:00 2 [IU] via SUBCUTANEOUS
  Administered 2019-01-23 – 2019-01-24 (×5): 3 [IU] via SUBCUTANEOUS
  Administered 2019-01-24: 20:00:00 2 [IU] via SUBCUTANEOUS
  Administered 2019-01-24: 12:00:00 3 [IU] via SUBCUTANEOUS
  Administered 2019-01-24: 5 [IU] via SUBCUTANEOUS
  Administered 2019-01-24: 11:00:00 3 [IU] via SUBCUTANEOUS
  Administered 2019-01-25: 2 [IU] via SUBCUTANEOUS
  Administered 2019-01-25: 20:00:00 3 [IU] via SUBCUTANEOUS
  Administered 2019-01-25: 2 [IU] via SUBCUTANEOUS
  Administered 2019-01-25: 3 [IU] via SUBCUTANEOUS
  Administered 2019-01-25: 2 [IU] via SUBCUTANEOUS
  Administered 2019-01-25: 3 [IU] via SUBCUTANEOUS
  Administered 2019-01-26 (×4): 2 [IU] via SUBCUTANEOUS

## 2019-01-15 MED ORDER — FENTANYL CITRATE (PF) 100 MCG/2ML IJ SOLN
12.5000 ug | Freq: Once | INTRAMUSCULAR | Status: AC
  Administered 2019-01-15: 12.5 ug via INTRAVENOUS

## 2019-01-15 MED ORDER — INSULIN DETEMIR 100 UNIT/ML ~~LOC~~ SOLN
10.0000 [IU] | Freq: Two times a day (BID) | SUBCUTANEOUS | Status: DC
  Administered 2019-01-15 – 2019-01-17 (×5): 10 [IU] via SUBCUTANEOUS
  Filled 2019-01-15 (×7): qty 0.1

## 2019-01-15 MED ORDER — FREE WATER
400.0000 mL | Status: DC
  Administered 2019-01-15 – 2019-01-16 (×8): 400 mL

## 2019-01-15 MED ORDER — VITAL HIGH PROTEIN PO LIQD
1000.0000 mL | ORAL | Status: DC
  Administered 2019-01-16 – 2019-01-19 (×4): 1000 mL

## 2019-01-15 MED ORDER — FENTANYL CITRATE (PF) 100 MCG/2ML IJ SOLN
50.0000 ug | INTRAMUSCULAR | Status: DC | PRN
  Administered 2019-01-15: 25 ug via INTRAVENOUS
  Administered 2019-01-16: 50 ug via INTRAVENOUS
  Filled 2019-01-15 (×3): qty 2

## 2019-01-15 MED ORDER — SODIUM CHLORIDE 0.9 % IV SOLN
1.0000 ug | Freq: Two times a day (BID) | INTRAVENOUS | Status: DC
  Administered 2019-01-15 – 2019-01-16 (×3): 1 ug via INTRAVENOUS
  Filled 2019-01-15 (×4): qty 0.25

## 2019-01-15 NOTE — Progress Notes (Signed)
Nutrition Follow-up RD working remotely.  DOCUMENTATION CODES:   Not applicable  INTERVENTION:   Decrease Vital High Protein to 45 ml/h (1080 ml per day) via Cortrak tube.  Add Pro-stat 30 ml BID.  Provides 1280 kcal, 124.5 gm protein, 903 ml free water daily.  NUTRITION DIAGNOSIS:   Inadequate oral intake related to acute illness, dysphagia, lethargy/confusion as evidenced by NPO status.  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 PMH includes HTN, anxiety  Cortrak placed 9/11, tip in the stomach. Currently receiving Vital High Protein at 50 ml/h, free water flushes 400 ml every 4 hours.   Patient was re-intubated on 9/11 for airway protection, poor mental status, ongoing seizures.   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 Sodium trending down with free water flushes.  Patient is currently intubated on ventilator support MV: 8.9 L/min Temp (24hrs), Avg:99.7 F (37.6 C), Min:98.5 F (36.9 C), Max:100.8 F (38.2 C)   Labs reviewed. Sodium 159 (H), magnesium 1.6 (L) CBG's: 250-194  Medications reviewed and include novolog, dilantin, keppra, versed, levophed, neosynephrine, vasopressin. No longer receiving Cleviprex.   IVF: LR with 20 mEq KCl at 150 ml/h  Weight up 2.3 kg since admission I/O + 1.5 L since admission  Diet Order:   Diet Order            Diet NPO time specified  Diet effective now              EDUCATION NEEDS:   Not appropriate for education at this time  Skin:  Skin Assessment: Reviewed RN Assessment(head incision)  Last BM:  9/12 type 7  Height:   Ht Readings from Last 1 Encounters:  01/13/19 '5\' 5"'$  (1.651 m)    Weight:   Wt Readings from  Last 1 Encounters:  01/15/19 86.3 kg   Admission weight 84 kg (BMI 30.8)  Ideal Body Weight:  56.8 kg  Estimated Nutritional Needs:   Kcal:  1260  Protein:  114 gm  Fluid:  >/= 1.8 L    Molli Barrows, RD, LDN, [place] Pager [phone] After Hours Pager [phone]

## 2019-01-15 NOTE — Progress Notes (Signed)
PULMONARY / CRITICAL CARE MEDICINE   NAME:  Jessica Mayer, MRN:  [id number], DOB:  02/07/1961, LOS: 4 ADMISSION DATE:  01/09/2019, CONSULTATION DATE: 01/16/2019 REFERRING MD: Neurosurgery, CHIEF COMPLAINT: Vent dependent respiratory failure secondary to right MCA clipping and craniotomy  BRIEF HISTORY:    58 year old female with a history of noncompliance with hypertensive medication who had altered mental status for 48 hours prior to admission on 01/06/2019 found to have a right MCA aneurysm was taken to surgery on 02/01/2019 left intubated pulmonary critical care consulted. HISTORY OF PRESENT ILLNESS   58 year old female is reported to have lethargy 48 hours prior to admission on 02/01/2019.  Initially admitted by neurology treated with 3% saline and neurosurgical consult was called.  Follow-up CT arteriogram revealed a right MCA aneurysm with hematoma and she was subsequently taken to the operating room by neurosurgery on 01/19/2019 and transferred to the neurosurgical service.  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 She is left intubated per anesthesia pulmonary critical care was asked to evaluate and assist in her care on full mechanical ventilatory support. SIGNIFICANT PAST MEDICAL HISTORY   Hypertension with noncompliance of medications Glaucoma on 3 eyedrops Anxiety  SIGNIFICANT EVENTS:  01/04/2019 - right frontal temporal craniotomy with clipping of right middle cerebral aneurysm and evacuation of right temporal hematoma  STUDIES:   CT scans revealing a right MCA aneurysm and hematoma  CULTURES:  MRSA negative SARS-CoV-2 negative  ANTIBIOTICS:  None  LINES/TUBES:  01/23/2019 endotracheal tube>> 01/21/2019  CONSULTANTS:  01/17/2019 pulmonary critical care SUBJECTIVE:  No further seizures overnight.  CONSTITUTIONAL: BP (!) 169/91   Pulse (!) 120   Temp (!) 100.6 F (38.1 C) (Axillary)   Resp (!) 27   Ht 5\' 5"  (1.651 m)   Wt 86.3 kg    LMP 05/29/2010   SpO2 100%   BMI 31.66 kg/m   I/O last 3 completed shifts: In: [id number] [I.V.:5022.6; NG/GT:2550; IV Piggyback:2607.5] Out: 8450 [Urine:8450]     Vent Mode: PRVC FiO2 (%):  [30 %-100 %] 30 % Set Rate:  [18 bmp] 18 bmp Vt Set:  [450 mL] 450 mL PEEP:  [5 cmH20] 5 cmH20 Plateau Pressure:  [14 cmH20-19 cmH20] 19 cmH20  PHYSICAL EXAM: General: Female who is poorly responsive. HEENT: Right cranial dressing dry and intact.  Right periorbital edema noted pupils equal reactive light Neuro: Poorly responsive.  Respirations with mild gurgling.  Moves right side spontaneously increased with pain CV: Heart sounds are regular rate and rhythm PULM: Shallow respirations.  Chest x-ray 01/14/2019 with low lung volumes. GI: soft, bsx4 active  Extremities: warm/dry, negative edema  Skin: no rashes or lesions    RESOLVED PROBLEM LIST   ASSESSMENT AND PLAN   Critically ill due to encephalopathy following subarachnoid hemorrhage and right MCA aneurysm clipping. Cannot rule out vasospasm as contributing factor. Will augment blood pressure using vasopressors to target systolic blood pressure [id number].   Critically due to status epilepticus requiring titration of sedative infusions to control seizures. Currently no evidence of seizures on continuous EEG Continue current anticonvulsants Begin slow Versed wean.  Monitor for recurrent seizures.  Critically ill due to hypernatremia from iatrogenic 3% saline and diabetes insipidus Continue free water replace Requires Foley catheter to monitor urine output carefully We will re-dose with DDAVP.  Hyperglycemia due to stress We will add basal coverage to NovoLog sliding scale  History of glaucoma On glucoma medications restarted 01/13/2019   Best Practice / Goals of  Care / Disposition.   DVT PROPHYLAXIS:SCD SUP: PPI NUTRITION: N.p.o -tube feeds. MOBILITY: Bedrest GOALS OF CARE: Full code FAMILY DISCUSSIONS: Family updated per  neurosurgery DISPOSITION remains extubated to intensive care unit  LABS  Glucose Recent Labs  Lab 01/14/19 1626 01/14/19 1928 01/14/19 2316 01/15/19 0328 01/15/19 0830 01/15/19 1112  GLUCAP 202* 197* 243* 224* 250* 194*   Dg Chest Port 1 View  Result Date: 01/15/2019 CLINICAL DATA:  Evaluate cardiopulmonary status. EXAM: PORTABLE CHEST 1 VIEW COMPARISON:  01/14/2019 FINDINGS: Enteric tube courses into the stomach and off the film as tip is not visualized. Left subclavian central venous catheter and endotracheal tubes are unchanged. Lungs are adequately inflated as patient is slightly rotated to the right. There is no lobar consolidation, effusion or pneumothorax. Slight prominence of the left perihilar vessels. Cardiomediastinal silhouette and remainder of the exam is unchanged. IMPRESSION: Possible minimal vascular congestion, otherwise no acute disease. Tubes and lines as described. Electronically Signed   By: Marin Olp M.D.   On: 01/15/2019 08:52   Dg Chest Portable 1 View  Result Date: 01/14/2019 CLINICAL DATA:  58 year old female with respiratory failure. Status post intubation. EXAM: PORTABLE CHEST 1 VIEW COMPARISON:  Chest radiograph date 01/14/2019 FINDINGS: Endotracheal tube with tip approximately 3 cm above the carina. Left subclavian central venous line with tip over cavoatrial junction. Interval removal of the enteric tube and placement of a feeding tube with tip beyond the inferior margin of the image. No interval change in the appearance of the lungs. No focal consolidation or pneumothorax. Stable cardiac silhouette. Slight prominence of the hilar vessels may represent a degree of pulmonary hypertension. No acute osseous pathology. IMPRESSION: 1. Endotracheal tube above the carina. Left subclavian central venous line with tip at the cavoatrial junction. 2. No interval change in the appearance of the lungs. Electronically Signed   By: Anner Crete M.D.   On: 01/14/2019 16:47    Dg Chest Port 1 View  Result Date: 01/14/2019 CLINICAL DATA:  Follow-up CVA. EXAM: PORTABLE CHEST 1 VIEW COMPARISON:  Multiple prior chest films. The most recent is 01/13/2019 FINDINGS: There is a new NG tube coursing down the esophagus and into the stomach. The cardiac silhouette, mediastinal and hilar contours are stable given the AP projection and portable technique. Low lung volumes with vascular crowding and streaky atelectasis. IMPRESSION: 1. Low lung volumes with vascular crowding and atelectasis. 2. NG tube in good position. Electronically Signed   By: Marijo Sanes M.D.   On: 01/14/2019 08:43   Vas [place] Transcranial Doppler  Result Date: 01/15/2019  Transcranial Doppler Indications: Subarachnoid hemorrhage. Limitations for diagnostic windows: Unable to insonate right transtemporal window.- due to bandages. Or Left siphon, bilateral vertebral Comparison Study: no prior Performing Technologist: June Leap RDMS, RVT  Examination Guidelines: A complete evaluation includes B-mode imaging, spectral Doppler, color Doppler, and power Doppler as needed of all accessible portions of each vessel. Bilateral testing is considered an integral part of a complete examination. Limited examinations for reoccurring indications may be performed as noted.  +----------+-------------+----------+-----------+-------+ RIGHT TCD Right VM (cm)Depth (cm)PulsatilityComment +----------+-------------+----------+-----------+-------+ Opthalmic     33.00                   1             +----------+-------------+----------+-----------+-------+ ICA siphon    48.00                 0.92            +----------+-------------+----------+-----------+-------+  +---------+------------+----------+-----------+-------+  LEFT TCD Left VM (cm)Depth (cm)PulsatilityComment +---------+------------+----------+-----------+-------+ Opthalmic   36.00                   1              +---------+------------+----------+-----------+-------+  +------------+-------+-------+             VM cm/sComment +------------+-------+-------+ Prox Basilar-27.00         +------------+-------+-------+ Dist Basilar-35.00         +------------+-------+-------+ Summary:  Absent bitemporal and left orbital windows greatly limits study. Normal mean flow velocities and pulsatility indices in right opthalmic and carotid siphon, basilar and left opthalmic artries. No vasospasm noted in identified vessels. *See table(s) above for measurements and observations.  Diagnosing physician: Antony Contras MD Electronically signed by Antony Contras MD on 01/15/2019 at 1:21:11 PM.    Final    [place] Ekg Site Rite  Result Date: 01/14/2019 If Site Rite image not attached, placement could not be confirmed due to current cardiac rhythm.   BMET Recent Labs  Lab 01/14/19 0449  01/14/19 1806 01/15/19 0027 01/15/19 0432 01/15/19 0647  NA 164*   < > 167* 160* 160* 159*  K 2.5*  --  3.6 3.8 3.8  --   CL >130*  --   --  125* 124*  --   CO2 25  --   --  25 26  --   BUN 7  --   --  8 8  --   CREATININE 0.60  --   --  0.70 0.69  --   GLUCOSE 166*  --   --  300* 295*  --    < > = values in this interval not displayed.    Liver Enzymes Recent Labs  Lab 01/25/2019 1532  AST 67*  ALT 26  ALKPHOS 65  BILITOT 0.7  ALBUMIN 4.1    Electrolytes Recent Labs  Lab 01/13/19 1508 01/14/19 0449 01/15/19 0027 01/15/19 0432  CALCIUM 8.1* 8.6* 8.4* 8.7*  MG 1.9 1.8  --  1.6*  PHOS 1.3* 1.8*  --  2.7    CBC Recent Labs  Lab 01/12/2019 1532 01/14/19 0449 01/14/19 1806  WBC 16.4* 15.5*  --   HGB 13.5 9.2* 8.5*  HCT 40.4 29.1* 25.0*  PLT 233 177  --     ABG No results for input(s): PHART, PCO2ART, PO2ART in the last 168 hours.  Coag's Recent Labs  Lab 01/21/2019 1532  INR 1.1    Sepsis Markers Recent Labs  Lab 01/21/2019 1600 01/25/2019 2018  LATICACIDVEN 1.5 1.9    Cardiac Enzymes No results for  input(s): TROPONINI, PROBNP in the last 168 hours.   CRITICAL CARE Performed by: Kipp Brood   Total critical care time: 40 minutes  Critical care time was exclusive of separately billable procedures and treating other patients.  Critical care was necessary to treat or prevent imminent or life-threatening deterioration.  Critical care was time spent personally by me on the following activities: development of treatment plan with patient and/or surrogate as well as nursing, discussions with consultants, evaluation of patient's response to treatment, examination of patient, obtaining history from patient or surrogate, ordering and performing treatments and interventions, ordering and review of laboratory studies, ordering and review of radiographic studies, pulse oximetry, re-evaluation of patient's condition and participation in multidisciplinary rounds.  Kipp Brood, MD [facility] ICU Physician [place]  Pager: [phone] Mobile: [phone] After hours: [phone].   01/15/2019, 3:13 PM

## 2019-01-15 NOTE — Progress Notes (Signed)
Neurology Progress Note   S:// Seen and examined. Intubated yesterday. On Versed 5 an hour for sedation On Keppra, Dilantin and Depakote. Remains very sedated  O:// Current vital signs: BP (!) 157/87   Pulse 100   Temp (!) 100.6 F (38.1 C) (Axillary)   Resp (!) 21   Ht _0  (1.651 m)   Wt 86.3 kg   LMP 05/29/2010   SpO2 100%   BMI 31.66 kg/m  Vital signs in last 24 hours: Temp:  [98.5 F (36.9 C)-100.8 F (38.2 C)] 100.6 F (38.1 C) (09/12 0800) Pulse Rate:  [86-124] 100 (09/12 1000) Resp:  [15-26] 21 (09/12 1000) BP: (82-173)/(47-96) 157/87 (09/12 1000) SpO2:  [99 %-100 %] 100 % (09/12 1130) FiO2 (%):  [30 %-100 %] 30 % (09/12 1130) Weight:  [86.3 kg] 86.3 kg (09/12 0450) Neurological exam Obtunded, on Versed 5 mg an hour No spontaneous movements Does not follow any commands or open eyes to voice and noxious immolation Cranial nerves: Right pupil difficult to examine due to eyelid swelling, left sluggishly reactive round, difficult ascertain facial symmetry.  Breathing with the ventilator Motor exam: No spontaneous movement, no movement to noxious stimulation Sensory exam as above Coordination cannot be assessed   Medications  Current Facility-Administered Medications:  .  0.9 %  sodium chloride infusion (Manually program via Guardrails IV Fluids), , Intravenous, Once, Moshe Salisbury, CRNA .  0.9 %  sodium chloride infusion, 250 mL, Intravenous, Continuous, Agarwala, Ravi, MD, Last Rate: 10 mL/hr at 01/15/19 0837 .  Place/Maintain arterial line, , , Until Discontinued **AND** 0.9 %  sodium chloride infusion, , Intra-arterial, PRN, Agarwala, Ravi, MD .  Place/Maintain arterial line, , , Until Discontinued **AND** 0.9 %  sodium chloride infusion, , Intra-arterial, PRN, Agarwala, Ravi, MD .  acetaminophen (TYLENOL) tablet 650 mg, 650 mg, Oral, Q4H PRN **OR** acetaminophen (TYLENOL) solution 650 mg, 650 mg, Per Tube, Q4H PRN, 650 mg at 01/15/19 0835 **OR**  acetaminophen (TYLENOL) suppository 650 mg, 650 mg, Rectal, Q4H PRN, Amie Portland, MD .  betaxolol (BETOPTIC-S) 0.5 % ophthalmic solution 1 drop, 1 drop, Both Eyes, BID, Minor, Grace Bushy, NP, 1 drop at 01/15/19 0948 .  brinzolamide (AZOPT) 1 % ophthalmic suspension 1 drop, 1 drop, Both Eyes, TID, Minor, Grace Bushy, NP, 1 drop at 01/15/19 0948 .  chlorhexidine gluconate (MEDLINE KIT) (PERIDEX) 0.12 % solution 15 mL, 15 mL, Mouth Rinse, BID, Agarwala, Ravi, MD, 15 mL at 01/15/19 0758 .  Chlorhexidine Gluconate Cloth 2 % PADS 6 each, 6 each, Topical, Daily, Amie Portland, MD, 6 each at 01/14/19 1059 .  feeding supplement (PRO-STAT SUGAR FREE 64) liquid 30 mL, 30 mL, Per Tube, BID, Nundkumar, Neelesh, MD .  feeding supplement (VITAL HIGH PROTEIN) liquid 1,000 mL, 1,000 mL, Per Tube, Continuous, Nundkumar, Neelesh, MD .  free water 400 mL, 400 mL, Per Tube, Q4H, Elsie Lincoln, MD, 400 mL at 01/15/19 1130 .  insulin aspart (novoLOG) injection 0-15 Units, 0-15 Units, Subcutaneous, Q4H, Elsie Lincoln, MD, 3 Units at 01/15/19 1127 .  lactated ringers 1,000 mL with potassium chloride 20 mEq infusion, , Intravenous, Continuous, Elsie Lincoln, MD, Last Rate: 150 mL/hr at 01/15/19 0900 .  lactated ringers infusion, , Intravenous, Continuous, Roderic Palau, MD .  latanoprost (XALATAN) 0.005 % ophthalmic solution 1 drop, 1 drop, Both Eyes, QHS, Minor, Grace Bushy, NP, 1 drop at 01/14/19 2115 .  levETIRAcetam (KEPPRA) IVPB 1500 mg/ 100 mL premix, 1,500 mg, Intravenous, Q12H, Amie Portland, MD, Stopped at  01/15/19 0853 .  MEDLINE mouth rinse, 15 mL, Mouth Rinse, 10 times per day, Agarwala, Ravi, MD, 15 mL at 01/15/19 1128 .  midazolam (VERSED) 50 mg/50 mL (1 mg/mL) premix infusion, 0-20 mg/hr, Intravenous, Continuous, Agarwala, Ravi, MD, Last Rate: 5 mL/hr at 01/15/19 0900, 5 mg/hr at 01/15/19 0900 .  niMODipine (NIMOTOP) capsule 60 mg, 60 mg, Oral, Q4H **OR** niMODipine (NYMALIZE) 6 MG/ML oral solution 60 mg, 60 mg,  Per Tube, Q4H, Pool, Mallie Mussel, MD, 60 mg at 01/15/19 1128 .  norepinephrine (LEVOPHED) 16 mg in 262m premix infusion, 0-40 mcg/min, Intravenous, Titrated, NConsuella Lose MD, Last Rate: 37.5 mL/hr at 01/15/19 0900, 40 mcg/min at 01/15/19 0900 .  pantoprazole (PROTONIX) injection 40 mg, 40 mg, Intravenous, QHS, AAmie Portland MD, 40 mg at 01/14/19 2112 .  phenylephrine (NEOSYNEPHRINE) 40-0.9 MG/250ML-% infusion, 25-200 mcg/min, Intravenous, Titrated, NConsuella Lose MD, Last Rate: 75 mL/hr at 01/15/19 0900, 200 mcg/min at 01/15/19 0900 .  phenytoin (DILANTIN) injection 100 mg, 100 mg, Intravenous, Q8H, AAmie Portland MD, 100 mg at 01/15/19 0622 .  sennosides (SENOKOT) 8.8 MG/5ML syrup 5 mL, 5 mL, Per Tube, BID, PEarnie Larsson MD, 5 mL at 01/14/19 2115 .  sodium chloride flush (NS) 0.9 % injection 10-40 mL, 10-40 mL, Intracatheter, PRN, NConsuella Lose MD .  valproate (DEPACON) 500 mg in dextrose 5 % 50 mL IVPB, 500 mg, Intravenous, Q8H, AKipp Brood MD, Stopped at 01/15/19 0735 .  vasopressin (PITRESSIN) 40 Units in sodium chloride 0.9 % 250 mL (0.16 Units/mL) infusion, 0.03 Units/min, Intravenous, Continuous, AKipp Brood MD, Stopped at 01/15/19 0755 Labs CBC    Component Value Date/Time   WBC 15.5 (H) 01/14/2019 0449   RBC 3.45 (L) 01/14/2019 0449   HGB 8.5 (L) 01/14/2019 1806   HCT 25.0 (L) 01/14/2019 1806   PLT 177 01/14/2019 0449   MCV 84.3 01/14/2019 0449   MCH 26.7 01/14/2019 0449   MCHC 31.6 01/14/2019 0449   RDW 13.5 01/14/2019 0449   LYMPHSABS 2.1 01/14/2019 0449   MONOABS 1.0 01/14/2019 0449   EOSABS 0.0 01/14/2019 0449   BASOSABS 0.0 01/14/2019 0449    CMP     Component Value Date/Time   NA 159 (H) 01/15/2019 0647   K 3.8 01/15/2019 0432   CL 124 (H) 01/15/2019 0432   CO2 26 01/15/2019 0432   GLUCOSE 295 (H) 01/15/2019 0432   BUN 8 01/15/2019 0432   CREATININE 0.69 01/15/2019 0432   CALCIUM 8.7 (L) 01/15/2019 0432   PROT 8.2 (H) 01/10/2019 1532    ALBUMIN 4.1 01/07/2019 1532   AST 67 (H) 01/20/2019 1532   ALT 26 01/10/2019 1532   ALKPHOS 65 02/02/2019 1532   BILITOT 0.7 01/19/2019 1532   GFRNONAA >60 01/15/2019 0432   GFRAA >60 01/15/2019 0432    Imaging I have reviewed images in epic and the results pertinent to this consultation are: No new imaging to review  EEG: Seizure subsided after the phenytoin addition yesterday around 2 PM.  She only has had one seizure at around 530, electrographic, this morning.  Assessment: 58year old with subarachnoid hemorrhage, intracranial hemorrhage secondary to large right MCA and status post right craniectomy clipping POD 3. Dr. EEG for seizure-like activity.  Multiple right-sided seizures-focal status epilepticus.  Loaded with Keppra initially arrival, second load with Depakote with no resolution.  Eventually phenytoin load added.  Patient intubated.  Started on minimal Versed drip for sedation.  With resolution of seizures and only one breakthrough seizure since yesterday afternoon.  It is  hard to say whether the addition of phenytoin is the one that control the seizures or worse at but either way, seizures appear to be well controlled.  Her examination is poor-completely obtunded.  Likely secondary to medication effect.  Concern is that if he go down on the sedation too rapidly, that might cause recurrence of seizures.  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 Stop valproate Continue with phenytoin 100 3 times daily Check phenytoin level If breakthrough seizures occur, and phenytoin is subtherapeutic-supplement phenytoin.  Otherwise additional load of Keppra. We will consider Vimpat addition as well. Decrease sedation-Versed by 1 mg/h.  If breakthrough seizures  occur, will resume benzodiazepines. As said before, goal would be seizure control and not bar suppression. Management of subarachnoid hemorrhage per neurosurgery.  -- Amie Portland, MD [facility] Pager: [phone] If 7pm to 7am, please call on call as listed on AMION.  CRITICAL CARE ATTESTATION Performed by: Amie Portland, MD Total critical care time: 40 minutes Critical care time was exclusive of separately billable procedures and treating other patients and/or supervising APPs/Residents/Students Critical care was necessary to treat or prevent imminent or life-threatening deterioration due to status epilepticus, subarachnoid hemorrhage, intraparenchymal hemorrhage. This patient is critically ill and at significant risk for neurological worsening and/or death and care requires constant monitoring. Critical care was time spent personally by me on the following activities: development of treatment plan with patient and/or surrogate as well as nursing, discussions with consultants, evaluation of patient's response to treatment, examination of patient, obtaining history from patient or surrogate, ordering and performing treatments and interventions, ordering and review of laboratory studies, ordering and review of radiographic studies, pulse oximetry, re-evaluation of patient's condition, participation in multidisciplinary rounds and medical decision making of high complexity in the care of this patient.

## 2019-01-15 NOTE — Progress Notes (Signed)
Notified physician of increasing urine output (312ml/h), urine specific gravity was 1.006, CBG 243 w/no insulin coverage. Orders placed.

## 2019-01-15 NOTE — Progress Notes (Signed)
LTM maintenance completed, added paste to Cz, C4, and O2.  Moved Fp1 and Fp2 as skin was slightly raised. Checked A1, no breakdown.

## 2019-01-15 NOTE — Progress Notes (Addendum)
S/O: Called regarding increasing BP and tachycardia now up to 140's in addition to not moving right arm, which was not an exam finding signed out by RN to RN at shift change.  BP (!) 148/75   Pulse (!) 133   Temp (!) 101.7 F (38.7 C) (Axillary)   Resp (!) 32   Ht 5\' 5"  (1.651 m)   Wt 86.3 kg   LMP 05/29/2010   SpO2 99%   BMI 31.66 kg/m   Exam: Ment: No responses to verbal stimuli. Eyes closed. No attempts to communicate. Will move LLE to noxious.  CN: Face flaccidly symmetric. Pupils 2 mm and sluggishly reactive bilaterally. Intubated.  Motor/Sensory: LUE and LLE flaccid with no movement to any stimuli. RUE flaccid without movement to sustained irritative stimuli, but will start to flex RLE at hip and knee.    A/R: 1. Gradually increasing BP and HR for > 24 hours. Not likely due to stopping Versed as it was still running until late today. Not likely due to volume depletion due to adequate fluids, normal BUN/Cr and MMM. Most likely secondary to the two pressors that she is on - RN is contacting CCM for titration. Could be due to pain - will administer 12.5 mcg IV fentanyl and assess for improvement. Fever-driven changes in vitals is also on the DDx given gradually increasing temperature.  2. Her temp increase may be secondary to central neurogenic fever. Tylenol and cooling towels PRN. When cooling blanket or ice packs are available (currently with hospital shortage), should be switched to one of those options.  3. Not moving RUE per RN, but moving RLE. Notes today and yesterday with no movement due to sedation, but CCM note from later today documents "moving right side". She has known left sided motor deficit from the [place]. My exam confirms flaccid weakness of RUE. Obtaining STAT CT head to further evaluate.  4. EEG shows diffuse slowing, with higher amplitudes on the left relative to the right. No electrographic seizures seen.   25 additional minutes spent in the neurological evaluation  and management of this critically ill patient.  Addendum: CT head completed, revealing worsened edema adjacent to the hematoma with increased leftward midline shift to 6 mm: 1. Increased edema in the right temporal lobe and operculum since 01/17/2019 along with leftward midline shift, now 6 mm (previously 4 mm). At the same time basilar cistern patency appears mildly improved. 2. Decreased pneumocephalus. Stable residual intracranial hemorrhage. No ventriculomegaly. 3. Stable right MCA region aneurysm clip.   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 1. Serum Na level already elevated. Restarting hypertonic saline is not an option.  2. Mannitol will only serve as a temporizing measure. The patient is not a good candidate for repeat surgical decompression procedure.  3. Will elevate HOB 30-45 degrees and continue to monitor.  4. Repeat CT head in 12 hours to assess for stability of the midline shift.   Additional 10 minutes spent in the emergent neurological evaluation and management of this critically ill patient. Time spent included CT image review.   Electronically signed: Dr. Kerney Elbe

## 2019-01-15 NOTE — Progress Notes (Signed)
SLP Cancellation Note  Patient Details Name: Jessica Mayer MRN: [id number] DOB: May 28, 1960   Cancelled treatment:   Pt intubated yesterday.  Our service will sign off and await new orders when ready.  Joriel Streety L. Tivis Ringer, [place] CCC/SLP Acute Rehabilitation Services Office number [phone] Pager [phone]         Juan Quam Laurice 01/15/2019, 8:50 AM

## 2019-01-15 NOTE — Progress Notes (Addendum)
[facility] Progress Note Patient Name: Jessica Mayer DOB: Jun 27, 1960 MRN: [id number]   Date of Service  01/15/2019  HPI/Events of Note  Notified of increased urine output of 350cc/hr.  Pt received ddavp on 01/14/2019 and is currently on vasopressin.   Serum Na dropped from 170 --> 167 --> 160. Pt is getting LR@100cc /hr and free water boluses 27ml q8hrs.  Pt also noted to be hyperglycemic.  eICU Interventions  Increase IVFs to 150cc/hr.  Increase free water boluses to 469ml q6 hours.   Increase frequency on serum Na checks to q4 hours given the change serum Na.   Add insulin sliding scale q4hr for glucose control.      Intervention Category Intermediate Interventions: Electrolyte abnormality - evaluation and management  Elsie Lincoln 01/15/2019, 2:52 AM   6:52 AM No change in serum Na this morning.  Still at 160. Pt still putting out 350cc/hr.  Plan> Increase free water bolus to 400cc q4hours.  Pt is already on vasopressin.  Consider switching to ddavp instead.

## 2019-01-15 NOTE — Progress Notes (Signed)
LTM reviewed from 01/15/2019 1157 till 1725. Patient event button was pushed at 1401 for unclear reasons. Concomitant eeg didn't show any eeg change to suggest seizure.  Single seizure was seen at 1208 with no clinical signs, lasting about 90 seconds.   Please refer to final report for details.

## 2019-01-15 NOTE — Progress Notes (Signed)
  NEUROSURGERY PROGRESS NOTE   Pt seen and examined. No issues overnight.  EXAM: Temp:  [98.5 F (36.9 C)-100.8 F (38.2 C)] 100.6 F (38.1 C) (09/12 0800) Pulse Rate:  [86-124] 93 (09/12 0800) Resp:  [15-28] 23 (09/12 0800) BP: (82-173)/(47-96) 171/92 (09/12 0800) SpO2:  [99 %-100 %] 100 % (09/12 0800) FiO2 (%):  [30 %-100 %] 30 % (09/12 0449) Weight:  [86.3 kg] 86.3 kg (09/12 0450) Intake/Output      09/11 0701 - 09/12 0700 09/12 0701 - 09/13 0700   I.V. (mL/kg) 3685.4 (42.7) 1476.2 (17.1)   NG/GT 2000 500   IV Piggyback 2438.8 55   Total Intake(mL/kg) 8124.1 (94.1) 2031.2 (23.5)   Urine (mL/kg/hr) 7575 (3.7) 1350 (6.1)   Emesis/NG output     Stool 0    Total Output 7575 1350   Net +549.1 +681.2        Stool Occurrence 1 x     On versed gtt No eye opening Pupils sluggishly reactive No motor responses Wound c/d/i  LABS: Lab Results  Component Value Date   CREATININE 0.69 01/15/2019   BUN 8 01/15/2019   NA 159 (H) 01/15/2019   K 3.8 01/15/2019   CL 124 (H) 01/15/2019   CO2 26 01/15/2019   Lab Results  Component Value Date   WBC 15.5 (H) 01/14/2019   HGB 8.5 (L) 01/14/2019   HCT 25.0 (L) 01/14/2019   MCV 84.3 01/14/2019   PLT 177 01/14/2019   TCD yesterday reviewed, not indicative of vasospasm  IMPRESSION: - 58 y.o. female SAH d# 6 POD# 3 s/p right craniotomy for clipping MCA aneurysm and clot evacuation.  - Postop status requiring intubation and versed gtt. - Hypernatremia likely DI   PLAN: - Cont vent mgmt per PCCM - Cont Keppra, PHT, VPA, mgmt of SZ per neurology - Cont permissive HTN, ideally SBP >167mmHg - Cont free water q8, Na appears trending down

## 2019-01-15 NOTE — Progress Notes (Signed)
Physician updated on hourly urine output; orders updated/recieved.

## 2019-01-16 ENCOUNTER — Inpatient Hospital Stay (HOSPITAL_COMMUNITY): Payer: Medicaid Other

## 2019-01-16 DIAGNOSIS — I611 Nontraumatic intracerebral hemorrhage in hemisphere, cortical: Secondary | ICD-10-CM

## 2019-01-16 LAB — TYPE AND SCREEN
ABO/RH(D): A POS
Antibody Screen: NEGATIVE
Unit division: 0
Unit division: 0
Unit division: 0
Unit division: 0

## 2019-01-16 LAB — BASIC METABOLIC PANEL
Anion gap: 13 (ref 5–15)
Anion gap: 15 (ref 5–15)
BUN: 9 mg/dL (ref 6–20)
BUN: 9 mg/dL (ref 6–20)
CO2: 26 mmol/L (ref 22–32)
CO2: 24 mmol/L (ref 22–32)
Calcium: 9.1 mg/dL (ref 8.9–10.3)
Calcium: 8.7 mg/dL — ABNORMAL LOW (ref 8.9–10.3)
Chloride: 117 mmol/L — ABNORMAL HIGH (ref 98–111)
Chloride: 115 mmol/L — ABNORMAL HIGH (ref 98–111)
Creatinine, Ser: 0.65 mg/dL (ref 0.44–1.00)
Creatinine, Ser: 0.71 mg/dL (ref 0.44–1.00)
GFR calc Af Amer: >60 mL/min (ref >60)
GFR calc Af Amer: >60 mL/min (ref >60)
GFR calc non Af Amer: >60 mL/min (ref >60)
GFR calc non Af Amer: >60 mL/min (ref >60)
Glucose, Bld: 160 mg/dL — ABNORMAL HIGH (ref 70–99)
Glucose, Bld: 164 mg/dL — ABNORMAL HIGH (ref 70–99)
Potassium: 3.4 mmol/L — ABNORMAL LOW (ref 3.5–5.1)
Potassium: 2.9 mmol/L — ABNORMAL LOW (ref 3.5–5.1)
Sodium: 156 mmol/L — ABNORMAL HIGH (ref 135–145)
Sodium: 154 mmol/L — ABNORMAL HIGH (ref 135–145)

## 2019-01-16 LAB — BPAM RBC
Blood Product Expiration Date: 202010022359
Blood Product Expiration Date: 202010022359
Blood Product Expiration Date: 202010022359
Blood Product Expiration Date: 202010022359
ISSUE DATE / TIME: 202009091243
ISSUE DATE / TIME: 202009091243
Unit Type and Rh: 6200
Unit Type and Rh: 6200
Unit Type and Rh: 6200
Unit Type and Rh: 6200

## 2019-01-16 LAB — GLUCOSE, CAPILLARY
Glucose-Capillary: 131 mg/dL — ABNORMAL HIGH (ref 70–99)
Glucose-Capillary: 138 mg/dL — ABNORMAL HIGH (ref 70–99)
Glucose-Capillary: 153 mg/dL — ABNORMAL HIGH (ref 70–99)
Glucose-Capillary: 165 mg/dL — ABNORMAL HIGH (ref 70–99)
Glucose-Capillary: 203 mg/dL — ABNORMAL HIGH (ref 70–99)
Glucose-Capillary: 207 mg/dL — ABNORMAL HIGH (ref 70–99)

## 2019-01-16 LAB — PHENYTOIN LEVEL, TOTAL: Phenytoin Lvl: 8.2 ug/mL — ABNORMAL LOW (ref 10.0–20.0)

## 2019-01-16 MED ORDER — FREE WATER
400.0000 mL | Freq: Three times a day (TID) | Status: DC
  Administered 2019-01-16 – 2019-01-18 (×5): 400 mL

## 2019-01-16 MED ORDER — POTASSIUM CHLORIDE 2 MEQ/ML IV SOLN
INTRAVENOUS | Status: DC
  Administered 2019-01-17 – 2019-01-21 (×8): via INTRAVENOUS
  Filled 2019-01-16 (×13): qty 1000

## 2019-01-16 MED ORDER — FENTANYL CITRATE (PF) 100 MCG/2ML IJ SOLN
50.0000 ug | Freq: Once | INTRAMUSCULAR | Status: AC
  Administered 2019-01-19: 23:00:00 50 ug via INTRAVENOUS
  Filled 2019-01-16: qty 2

## 2019-01-16 MED ORDER — FENTANYL CITRATE (PF) 100 MCG/2ML IJ SOLN
25.0000 ug | INTRAMUSCULAR | Status: DC | PRN
  Administered 2019-01-16 – 2019-01-23 (×16): 25 ug via INTRAVENOUS
  Filled 2019-01-16 (×16): qty 2

## 2019-01-16 MED ORDER — VASOPRESSIN 20 UNIT/ML IV SOLN
0.0300 [IU]/min | INTRAVENOUS | Status: DC
  Filled 2019-01-16: qty 2

## 2019-01-16 MED ORDER — IOHEXOL 350 MG/ML SOLN
75.0000 mL | Freq: Once | INTRAVENOUS | Status: AC | PRN
  Administered 2019-01-16: 75 mL via INTRAVENOUS

## 2019-01-16 MED ORDER — NIMODIPINE 30 MG PO CAPS
30.0000 mg | ORAL_CAPSULE | ORAL | Status: DC
  Administered 2019-01-18 (×4): 30 mg via ORAL
  Filled 2019-01-16 (×5): qty 1

## 2019-01-16 MED ORDER — MILRINONE LACTATE IN DEXTROSE 20-5 MG/100ML-% IV SOLN
0.2500 ug/kg/min | INTRAVENOUS | Status: DC
  Administered 2019-01-16 – 2019-01-17 (×2): 0.25 ug/kg/min via INTRAVENOUS
  Filled 2019-01-16 (×2): qty 100

## 2019-01-16 MED ORDER — BISACODYL 10 MG RE SUPP
10.0000 mg | Freq: Every day | RECTAL | Status: DC | PRN
  Filled 2019-01-16: qty 1

## 2019-01-16 MED ORDER — NIMODIPINE 6 MG/ML PO SOLN
30.0000 mg | ORAL | Status: DC
  Administered 2019-01-16 – 2019-01-17 (×17): 30 mg
  Filled 2019-01-16 (×14): qty 10

## 2019-01-16 NOTE — Progress Notes (Signed)
Critical Care Attending Progress Note  CTA head and neck personally reviewed and shows infarction of right fronto-temporal area adjacent to clipping site. Associated vasospasm and edema. Diffuse vasospasm noted.  Augmentation parameters increased to SBP 180. Added milrinone. Add vasopressin if necessary.  Discussed with Neurology and Neurosurgery.  CRITICAL CARE Performed by: Kipp Brood   Total critical care time: 30 minutes  Critical care time was exclusive of separately billable procedures and treating other patients.  Critical care was necessary to treat or prevent imminent or life-threatening deterioration.  Critical care was time spent personally by me on the following activities: development of treatment plan with patient and/or surrogate as well as nursing, discussions with consultants, evaluation of patient's response to treatment, examination of patient, obtaining history from patient or surrogate, ordering and performing treatments and interventions, ordering and review of laboratory studies, ordering and review of radiographic studies, pulse oximetry, re-evaluation of patient's condition and participation in multidisciplinary rounds.  Kipp Brood, MD [facility] ICU Physician [place]  Pager: [phone] Mobile: [phone] After hours: [phone].

## 2019-01-16 NOTE — Procedures (Signed)
Patient [place] [id number] Epilepsy Attending:Brionna Romanek Barbra Sarks Referring Physician/Provider:Dr Kipp Brood Duration:01/15/2019 1157 to 01/16/2019 1157  Patient [id number] F with ams and SAH s/p MCA aneurysm clipping. EEG to evaluate for seizures  Level of alertness:comatose  AEDs during EEG study:LEV, VPA, PHT, versed  Technical aspects: This EEG study was done with scalp electrodes positioned according to the 10-20 International system of electrode placement. Electrical activity was acquired at a sampling rate of 500Hz  and reviewed with a high frequency filter of 70Hz  and a low frequency filter of 1Hz . EEG data were recorded continuously and digitally stored.  DESCRIPTION: EEG showed 10 seizures with no clinical signs, arising from right frontocentral region lasting 1-1.5 minutes, last seizure on 01/16/2019 at 0727.There were also periodic epileptiform discharges at therateof 1hz  in right frontocentral region. EEG showed polyspikes at the rate of 1Hz  intermittently in left frontal region. These polyspikes at times became rhythmic but no clear evolution was seen to suggest seizure. Additionally, there was also continuous generalized 2-5hz  theta-delta slowing, maximal right hemisphere.Hyperventilation and photic stimulation were not performed.  Event button was pushed on 01/15/2019 for unclear reasons. Concomitant EEG before during and after the eventdidn't show any EEG change to suggest seizure.  IMPRESSION: This studyshowed 10 seizures arising from right frontocentral region with no clinical signs. There was also epileptogenicity in left frontal region which appears to be new since previous study. Additionally, there is cortical dysfunction in right hemisphere consistent with prior craniotomy as well as severe diffuse encephalopathy.  Treating provider was notified frequently as versed was weaned off.

## 2019-01-16 NOTE — Progress Notes (Signed)
LTM reviewed till 1519. No definite seizures seen. Please review final report for details.

## 2019-01-16 NOTE — Progress Notes (Signed)
LTM maintenance completed, no skin breakdown was seen. F8 had come off, reprepped and taped. F7 checked and reprepped. CZ and C4 added paste.

## 2019-01-16 NOTE — Progress Notes (Signed)
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  AEDs initiated. 01/15/2019 -blood pressure augmentation for suspected vasospasm STUDIES:   01/31/2019 CT scans revealing a right MCA aneurysm and hematoma 01/15/2019 CT scan shows stable residual hematoma but increased surrounding edema. CULTURES:  MRSA negative SARS-CoV-2 negative  ANTIBIOTICS:  None  LINES/TUBES:  01/18/2019 endotracheal tube>> 01/07/2019  CONSULTANTS:  01/24/2019 pulmonary critical care SUBJECTIVE:  No further seizures overnight.  Not moving right upper extremity today.  CONSTITUTIONAL: BP (!) 155/102   Pulse (!) 110   Temp 100.3 F (37.9 C) (Axillary)   Resp (!) 24   Ht 5\' 5"  (1.651 m)   Wt 85.6 kg   LMP 05/29/2010   SpO2 100%   BMI 31.40 kg/m   I/O last 3 completed shifts: In: 14184.6 [I.V.:9089.7; NG/GT:4529.9; IV Piggyback:565] Out: [id number] [Urine:14925]     Vent Mode: PRVC FiO2 (%):  [30 %-50 %] 50 % Set Rate:  [18 bmp] 18 bmp Vt Set:  [450 mL] 450 mL PEEP:  [5 cmH20] 5 cmH20 Plateau Pressure:  [18 [id number] cmH20] 18 cmH20  PHYSICAL  EXAM: General: Female who intubated and unresponsive. HEENT: Right cranial dressing dry and intact.  Right periorbital edema noted pupils equal reactive light Neuro: Poorly responsive.  Now off sedation spontaneous movement of right lower extremity only.  No movement of right upper extremity even to painful stimuli.  Stable left hemiplegia CV: Heart sounds are regular rate and rhythm PULM: Mechanically ventilated with no dyssynchrony.  Chest is clear.  Intermittent cuff leak based on head position. GI: soft, bsx4 active  Extremities: warm/dry, negative edema  Skin: no rashes or lesions  RESOLVED PROBLEM LIST   ASSESSMENT AND PLAN   Critically ill due to encephalopathy following subarachnoid hemorrhage and right MCA aneurysm clipping. Cannot rule out vasospasm as contributing factor. Will continue to augment blood pressure using vasopressors to target systolic blood pressure [id number]. Neurosurgery to consider cerebral angiography and intra-arterial calcium channel blocker.  Critically due to status epilepticus requiring titration of sedative infusions to control seizures. Currently no evidence of seizures on continuous EEG Continue current anticonvulsants Now off Versed infusion  Critically ill due to hypernatremia from iatrogenic 3% saline and diabetes insipidus Continue free water replace Requires Foley catheter to monitor urine output carefully We continue to dose  DDAVP Follow sodium.Marland Kitchen  Hyperglycemia due to stress We will add basal coverage to NovoLog sliding scale  History of glaucoma On glucoma medications restarted 01/13/2019   Best Practice / Goals of Care / Disposition.   DVT PROPHYLAXIS:SCD SUP: PPI NUTRITION: N.p.o -tube feeds. MOBILITY: Bedrest GOALS OF CARE: Full code FAMILY DISCUSSIONS: Family updated per neurosurgery DISPOSITION remains extubated to intensive care unit  LABS  Glucose Recent  Labs  Lab 01/15/19 1112 01/15/19 1519 01/15/19 1929  01/15/19 2348 01/16/19 0410 01/16/19 0813  GLUCAP 194* 205* 168* 181* 165* 131*   Ct Head Wo Contrast  Result Date: 01/15/2019 CLINICAL DATA:  58 year old female status post surgical clipping of ruptured right MCA bifurcation aneurysm, postoperative day 3. EXAM: CT HEAD WITHOUT CONTRAST TECHNIQUE: Contiguous axial images were obtained from the base of the skull through the vertex without intravenous contrast. COMPARISON:  Head CTs 01/04/2019 and earlier. FINDINGS: Brain: Decreased pneumocephalus with small volume residual. Gas and hemorrhage at the right operculum. Superimposed confluent cytotoxic edema in the anterior right temporal lobe appears increased. Similar increased confluent hypodensity in the anterior right insula. Small volume intraventricular hemorrhage is stable. Small volume right convexity. Subarachnoid hemorrhage is stable. Improved basilar cistern patency. But mass effect on the right lateral ventricle and leftward midline shift appear mildly increased (midline shift now 6 millimeters, previously 4 millimeters). Stable gray-white matter differentiation in the left hemisphere and posterior fossa. No ventriculomegaly. Vascular: Right MCA M1 region aneurysm clip with stable configuration. Skull: Stable. Right frontotemporal and orbital frontal craniotomy changes. There seems to be a superimposed nondisplaced right orbital roof fracture. No new osseous abnormality. Sinuses/Orbits: Left nasoenteric tube remains in place. Stable mild paranasal sinus mucosal thickening. Tympanic cavities and mastoids remain clear. Other: Stable postoperative changes to the scalp. Persistent right para but ULs oft tissue swelling but decreased right orbital gas. IMPRESSION: 1. Increased edema in the right temporal lobe and operculum since 01/24/2019 along with leftward midline shift, now 6 mm (previously 4 mm). At the same time basilar cistern patency appears mildly improved. 2. Decreased pneumocephalus. Stable  residual intracranial hemorrhage. No ventriculomegaly. 3. Stable right MCA region aneurysm clip. Electronically Signed   By: Genevie Ann M.D.   On: 01/15/2019 22:25   Dg Chest Port 1 View  Result Date: 01/15/2019 CLINICAL DATA:  Evaluate cardiopulmonary status. EXAM: PORTABLE CHEST 1 VIEW COMPARISON:  01/14/2019 FINDINGS: Enteric tube courses into the stomach and off the film as tip is not visualized. Left subclavian central venous catheter and endotracheal tubes are unchanged. Lungs are adequately inflated as patient is slightly rotated to the right. There is no lobar consolidation, effusion or pneumothorax. Slight prominence of the left perihilar vessels. Cardiomediastinal silhouette and remainder of the exam is unchanged. IMPRESSION: Possible minimal vascular congestion, otherwise no acute disease. Tubes and lines as described. Electronically Signed   By: Marin Olp M.D.   On: 01/15/2019 08:52   Dg Chest Portable 1 View  Result Date: 01/14/2019 CLINICAL DATA:  58 year old female with respiratory failure. Status post intubation. EXAM: PORTABLE CHEST 1 VIEW COMPARISON:  Chest radiograph date 01/14/2019 FINDINGS: Endotracheal tube with tip approximately 3 cm above the carina. Left subclavian central venous line with tip over cavoatrial junction. Interval removal of the enteric tube and placement of a feeding tube with tip beyond the inferior margin of the image. No interval change in the appearance of the lungs. No focal consolidation or pneumothorax. Stable cardiac silhouette. Slight prominence of the hilar vessels may represent a degree of pulmonary hypertension. No acute osseous pathology. IMPRESSION: 1. Endotracheal tube above the carina. Left subclavian central venous line with tip at the cavoatrial junction. 2. No interval change in the appearance of the lungs. Electronically Signed   By: Anner Crete M.D.   On: 01/14/2019 16:47   Vas [place] Transcranial Doppler  Result Date: 01/16/2019   Transcranial Doppler Indications: Subarachnoid hemorrhage. Limitations for diagnostic windows: Unable to insonate  left transtemporal window. Comparison Study: pervious study done 01/14/19 Performing Technologist: Abram Sander RVS  Examination Guidelines: A complete evaluation includes B-mode imaging, spectral Doppler, color Doppler, and power Doppler as needed of all accessible portions of each vessel. Bilateral testing is considered an integral part of a complete examination. Limited examinations for reoccurring indications may be performed as noted.  +----------+-------------+----------+-----------+-------+ RIGHT TCD Right VM (cm)Depth (cm)PulsatilityComment +----------+-------------+----------+-----------+-------+ MCA           43.00                 1.01            +----------+-------------+----------+-----------+-------+ Term ICA      43.00                 1.23            +----------+-------------+----------+-----------+-------+ Opthalmic     25.00                 1.21            +----------+-------------+----------+-----------+-------+ ICA siphon    37.00                 1.04            +----------+-------------+----------+-----------+-------+  +----------+------------+----------+-----------+-------+ LEFT TCD  Left VM (cm)Depth (cm)PulsatilityComment +----------+------------+----------+-----------+-------+ Opthalmic    17.00                 1.57            +----------+------------+----------+-----------+-------+ ICA siphon   51.00                 1.22            +----------+------------+----------+-----------+-------+     Preliminary    Vas [place] Transcranial Doppler  Result Date: 01/15/2019  Transcranial Doppler Indications: Subarachnoid hemorrhage. Limitations for diagnostic windows: Unable to insonate right transtemporal window.- due to bandages. Or Left siphon, bilateral vertebral Comparison Study: no prior Performing Technologist: June Leap RDMS, RVT   Examination Guidelines: A complete evaluation includes B-mode imaging, spectral Doppler, color Doppler, and power Doppler as needed of all accessible portions of each vessel. Bilateral testing is considered an integral part of a complete examination. Limited examinations for reoccurring indications may be performed as noted.  +----------+-------------+----------+-----------+-------+ RIGHT TCD Right VM (cm)Depth (cm)PulsatilityComment +----------+-------------+----------+-----------+-------+ Opthalmic     33.00                   1             +----------+-------------+----------+-----------+-------+ ICA siphon    48.00                 0.92            +----------+-------------+----------+-----------+-------+  +---------+------------+----------+-----------+-------+ LEFT TCD Left VM (cm)Depth (cm)PulsatilityComment +---------+------------+----------+-----------+-------+ Opthalmic   36.00                   1             +---------+------------+----------+-----------+-------+  +------------+-------+-------+             VM cm/sComment +------------+-------+-------+ Prox Basilar-27.00         +------------+-------+-------+ Dist Basilar-35.00         +------------+-------+-------+ Summary:  Absent bitemporal and left orbital windows greatly limits study. Normal mean flow velocities and pulsatility indices in right opthalmic and carotid siphon, basilar and left opthalmic artries. No vasospasm noted in identified vessels. *See table(s) above for measurements and observations.  Diagnosing physician: Antony Contras MD Electronically signed by Antony Contras MD on 01/15/2019 at 1:21:11 PM.    Final     BMET Recent Labs  Lab 01/15/19 0432 01/15/19 0647 01/15/19 1636 01/16/19 0557  NA 160* 159* 158* 156*  K 3.8  --  3.3* 3.4*  CL 124*  --  122* 117*  CO2 26  --  26 26  BUN 8  --  9 9  CREATININE 0.69  --  0.65 0.65  GLUCOSE 295*  --  221* 160*    Liver Enzymes Recent Labs   Lab 01/29/2019 1532  AST 67*  ALT 26  ALKPHOS 65  BILITOT 0.7  ALBUMIN 4.1    Electrolytes Recent Labs  Lab 01/13/19 1508 01/14/19 0449  01/15/19 0432 01/15/19 1636 01/16/19 0557  CALCIUM 8.1* 8.6*   < > 8.7* 8.9 9.1  MG 1.9 1.8  --  1.6*  --   --   PHOS 1.3* 1.8*  --  2.7  --   --    < > = values in this interval not displayed.    CBC Recent Labs  Lab 01/15/2019 1532 01/14/19 0449 01/14/19 1806  WBC 16.4* 15.5*  --   HGB 13.5 9.2* 8.5*  HCT 40.4 29.1* 25.0*  PLT 233 177  --     ABG No results for input(s): PHART, PCO2ART, PO2ART in the last 168 hours.  Coag's Recent Labs  Lab 01/28/2019 1532  INR 1.1    Sepsis Markers Recent Labs  Lab 01/24/2019 1600 01/08/2019 2018  LATICACIDVEN 1.5 1.9    Cardiac Enzymes No results for input(s): TROPONINI, PROBNP in the last 168 hours.   CRITICAL CARE Performed by: Kipp Brood   Total critical care time: 40 minutes  Critical care time was exclusive of separately billable procedures and treating other patients.  Critical care was necessary to treat or prevent imminent or life-threatening deterioration.  Critical care was time spent personally by me on the following activities: development of treatment plan with patient and/or surrogate as well as nursing, discussions with consultants, evaluation of patient's response to treatment, examination of patient, obtaining history from patient or surrogate, ordering and performing treatments and interventions, ordering and review of laboratory studies, ordering and review of radiographic studies, pulse oximetry, re-evaluation of patient's condition and participation in multidisciplinary rounds.  Kipp Brood, MD [facility] ICU Physician [facility]  Pager: [phone] Mobile: [phone] After hours: [phone].   01/16/2019, 11:37 AM

## 2019-01-16 NOTE — Procedures (Signed)
**Note Jessica-Identified via Obfuscation** Patient Jessica Mayer [id number] Epilepsy Attending:Rojean Ige Barbra Sarks Referring Physician/Provider:Dr Kipp Brood Duration: 01/14/2019 1157 to 01/15/2019 1157  Patient history:58yo F with ams and SAH s/p MCA aneurysm clipping. EEG to evaluate for seizures  Level of alertness:comatose  AEDs during EEG study:LEV, VPA, PHT, versed  Technical aspects: This EEG study was done with scalp electrodes positioned according to the 10-20 International system of electrode placement. Electrical activity was acquired at a sampling rate of 500Hz  and reviewed with a high frequency filter of 70Hz  and a low frequency filter of 1Hz . EEG data were recorded continuously and digitally stored.  DESCRIPTION: EEG showed 26 seizures with no clinical signs, arising from right frontocentral region, avg 8-10/hour, lasting 1-1.5 minutes, till 1356. After this patient was intubated and started on versed and loaded with fosphenytoin. There were two more seizures after starting versed similar to seizures described above on 01/15/2019 at 0525 and 1128. There was also periodic epileptiform discharges at the rate of 1hz  in right frontocentral region and continuous generalized 2-5hz  theta-delta slowing, maximal right hemisphere.Hyperventilation and photic stimulation were not performed.  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 There is also cortical dysfunction in right hemisphere consistent with prior craniotomy. Additionally there is evidence of moderate to severe diffuse encephalopathy.  Treating provider was notified throughout the study.

## 2019-01-16 NOTE — Progress Notes (Signed)
Called due to patient desaturating. Upon arrival patient was 96% on 50% FIO2  +8 peep. Per RN she has been requiring the 2 min of 100% FIO2 for appoximately 5-10 minutes.  Patient suctioned for small amount of thick yellow secretions.  ETT secured. Patient continues to desaturate <92%. FIO2 increased to 80%.  Peep decreased to 5cm H20 per order.  RT will continue to monitor.

## 2019-01-16 NOTE — Procedures (Signed)
Arterial Catheter Insertion Procedure Note Clela Tays [id number] 02-Feb-1961  Procedure: Insertion of Arterial Catheter  Indications: Blood pressure monitoring  Procedure Details Consent: Unable to obtain consent because of emergent medical necessity. Time Out: Verified patient identification, verified procedure, site/side was marked, verified correct patient position, special equipment/implants available, medications/allergies/relevent history reviewed, required imaging and test results available.  Performed  Maximum sterile technique was used including antiseptics, cap, gloves, gown, hand hygiene, mask and sheet. Skin prep: Chlorhexidine; local anesthetic administered 20 gauge catheter was inserted into left radial artery using the Seldinger technique. ULTRASOUND GUIDANCE USED: YES Evaluation Blood flow good; BP tracing good. Complications: No apparent complications.   Einar Grad Aston Lawhorn 01/16/2019

## 2019-01-16 NOTE — Progress Notes (Signed)
[facility] Progress Note Patient Name: Jessica Mayer DOB: [birth year] MRN: [id number]   Date of Service  01/16/2019  HPI/Events of Note  Multiple issues: 1. Multiple loose stools - Request for Flexiseal and 2. BP = 203/107 - Request for an extra dose of Fentanyl IV  eICU Interventions  Will order: 1. Place Flexiseal. 2. Fentanyl 50 mcg IV X 1 now.      Intervention Category Major Interventions: Other:;Hypertension - evaluation and management  Sommer,Steven Cornelia Copa 01/16/2019, 11:18 PM

## 2019-01-16 NOTE — Progress Notes (Signed)
Subjective: Patient reports new right arm weakness.  Objective: Vital signs in last 24 hours: Temp:  [99.9 F (37.7 C)-101.7 F (38.7 C)] 100.3 F (37.9 C) (09/13 0800) Pulse Rate:  [95-139] 124 (09/13 0915) Resp:  [18-34] 30 (09/13 0915) BP: (111-194)/(57-108) 139/78 (09/13 0915) SpO2:  [95 %-100 %] 100 % (09/13 0915) FiO2 (%):  [30 %-50 %] 50 % (09/13 0812) Weight:  [85.6 kg] 85.6 kg (09/13 0500)  Intake/Output from previous day: 09/12 0701 - 09/13 0700 In: 10875.6 [I.V.:7135.7; NG/GT:3329.9; IV Piggyback:410.1] Out: [id number] [Urine:10425] Intake/Output this shift: Total I/O In: 1394.3 [I.V.:759.3; NG/GT:535; IV Piggyback:100] Out: 650 [Urine:650]  Physical Exam: Both arms now weak.  Remains sedated on vent.  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 EXAM: CT HEAD WITHOUT CONTRAST TECHNIQUE: Contiguous axial images were obtained from the base of the skull through the vertex without intravenous contrast. COMPARISON:  Head CTs 01/29/2019 and earlier. FINDINGS: Brain: Decreased pneumocephalus with small volume residual. Gas and hemorrhage at the right operculum. Superimposed confluent cytotoxic edema in the anterior right temporal lobe appears increased. Similar increased confluent hypodensity in the anterior right insula. Small volume intraventricular hemorrhage is stable. Small volume right convexity. Subarachnoid hemorrhage is stable. Improved basilar cistern patency. But mass effect on the right lateral ventricle and leftward  midline shift appear mildly increased (midline shift now 6 millimeters, previously 4 millimeters). Stable gray-white matter differentiation in the left hemisphere and posterior fossa. No ventriculomegaly. Vascular: Right MCA M1 region aneurysm clip with stable configuration. Skull: Stable. Right frontotemporal and orbital frontal craniotomy changes. There seems to be a superimposed nondisplaced right orbital roof fracture. No new osseous abnormality. Sinuses/Orbits: Left nasoenteric tube remains in place. Stable mild paranasal sinus mucosal thickening. Tympanic cavities and mastoids remain clear. Other: Stable postoperative changes to the scalp. Persistent right para but ULs oft tissue swelling but decreased right orbital gas. IMPRESSION: 1. Increased edema in the right temporal lobe and operculum since 01/17/2019 along with leftward midline shift, now 6 mm (previously 4 mm). At the same time basilar cistern patency appears mildly improved. 2. Decreased pneumocephalus. Stable residual intracranial hemorrhage. No ventriculomegaly. 3. Stable right MCA region aneurysm clip. Electronically Signed   By: Genevie Ann M.D.   On: 01/15/2019 22:25   Dg Chest Port 1 View  Result Date: 01/15/2019 CLINICAL DATA:  Evaluate cardiopulmonary status. EXAM: PORTABLE CHEST 1 VIEW COMPARISON:  01/14/2019 FINDINGS: Enteric tube courses into the stomach and off the film as tip is not visualized. Left subclavian central venous catheter and endotracheal tubes are unchanged. Lungs are adequately inflated as patient is slightly rotated to the right. There is no lobar consolidation, effusion or pneumothorax. Slight prominence of the left perihilar vessels. Cardiomediastinal silhouette and remainder of the exam is unchanged. IMPRESSION: Possible minimal vascular congestion, otherwise no acute disease. Tubes and lines as described. Electronically Signed   By: Marin Olp M.D.   On: 01/15/2019 08:52  Dg Chest Portable 1 View  Result Date:  01/14/2019 CLINICAL DATA:  58 year old female with respiratory failure. Status post intubation. EXAM: PORTABLE CHEST 1 VIEW COMPARISON:  Chest radiograph date 01/14/2019 FINDINGS: Endotracheal tube with tip approximately 3 cm above the carina. Left subclavian central venous line with tip over cavoatrial junction. Interval removal of the enteric tube and placement of a feeding tube with tip beyond the inferior margin of the image. No interval change in the appearance of the lungs. No focal consolidation or pneumothorax. Stable cardiac silhouette. Slight prominence of the hilar vessels may represent a degree of pulmonary hypertension. No acute osseous pathology. IMPRESSION: 1. Endotracheal tube above the carina. Left subclavian central venous line with tip at the cavoatrial junction. 2. No interval change in the appearance of the lungs. Electronically Signed   By: Anner Crete M.D.   On: 01/14/2019 16:47   Vas [place] Transcranial Doppler  Result Date: 01/15/2019  Transcranial Doppler Indications: Subarachnoid hemorrhage. Limitations for diagnostic windows: Unable to insonate right transtemporal window.- due to bandages. Or Left siphon, bilateral vertebral Comparison Study: no prior Performing Technologist: June Leap RDMS, RVT  Examination Guidelines: A complete evaluation includes B-mode imaging, spectral Doppler, color Doppler, and power Doppler as needed of all accessible portions of each vessel. Bilateral testing is considered an integral part of a complete examination. Limited examinations for reoccurring indications may be performed as noted.  +----------+-------------+----------+-----------+-------+ RIGHT TCD Right VM (cm)Depth (cm)PulsatilityComment +----------+-------------+----------+-----------+-------+ Opthalmic     33.00                   1             +----------+-------------+----------+-----------+-------+ ICA siphon    48.00                 0.92             +----------+-------------+----------+-----------+-------+  +---------+------------+----------+-----------+-------+ LEFT TCD Left VM (cm)Depth (cm)PulsatilityComment +---------+------------+----------+-----------+-------+ Opthalmic   36.00                   1             +---------+------------+----------+-----------+-------+  +------------+-------+-------+             VM cm/sComment +------------+-------+-------+ Prox Basilar-27.00         +------------+-------+-------+ Dist Basilar-35.00         +------------+-------+-------+ Summary:  Absent bitemporal and left orbital windows greatly limits study. Normal mean flow velocities and pulsatility indices in right opthalmic and carotid siphon, basilar and left opthalmic artries. No vasospasm noted in identified vessels. *See table(s) above for measurements and observations.  Diagnosing physician: Antony Contras MD Electronically signed by Antony Contras MD on 01/15/2019 at 1:21:11 PM.    Final    [place] Ekg Site Rite  Result Date: 01/14/2019 If Site Rite image not attached, placement could not be confirmed due to current cardiac rhythm.   Assessment/Plan: Concern for neurologic change with presence of seizures on EEG on right, which should likely affect left arm weakness, but should not cause right arm weakness.  Plan is for TCDs today with MRI to r/o stroke, consider angiogram if vasospasm likely explanation for new deficit.    LOS: 5 days    Peggyann Shoals, MD 01/16/2019, 9:32 AM

## 2019-01-16 NOTE — Progress Notes (Signed)
TCD has been completed.   Preliminary results in CV Proc.   Jessica Mayer 01/16/2019 10:24 AM

## 2019-01-16 NOTE — Progress Notes (Signed)
Neurology Progress Note   S:// Seen and examined. Noted o/n to have right weakness CTH with wosened edema.   O:// Current vital signs: BP (!) 152/85   Pulse (!) 108   Temp 100.3 F (37.9 C) (Axillary)   Resp (!) 26   Ht 5' 5" (1.651 m)   Wt 85.6 kg   LMP 05/29/2010   SpO2 100%   BMI 31.40 kg/m  Vital signs in last 24 hours: Temp:  [99.9 F (37.7 C)-101.7 F (38.7 C)] 100.3 F (37.9 C) (09/13 0800) Pulse Rate:  [95-139] 108 (09/13 1030) Resp:  [18-34] 26 (09/13 1030) BP: (111-194)/(57-108) 152/85 (09/13 1030) SpO2:  [95 %-100 %] 100 % (09/13 1030) FiO2 (%):  [30 %-50 %] 50 % (09/13 0812) Weight:  [85.6 kg] 85.6 kg (09/13 0500) Neurological exam Mental status: No responses to verbal stimulus.  Does not open eyes.  Does not follow commands or attempt to communicate. Cranial nerves: Face is symmetric, pupils 2 mm reactive right pupil difficult to examine due to swelling, difficult ascertain facial symmetry. Motor exam: Left upper and lower extremity flaccid without movement.  Right upper extremity flaccid without any movement.  Right lower extremity flexes to noxious immolation. Sensory exam: As above  Medications  Current Facility-Administered Medications:  .  0.9 %  sodium chloride infusion (Manually program via Guardrails IV Fluids), , Intravenous, Once, Moshe Salisbury, CRNA .  0.9 %  sodium chloride infusion, 250 mL, Intravenous, Continuous, Agarwala, Ravi, MD, Last Rate: 10 mL/hr at 01/15/19 0837 .  Place/Maintain arterial line, , , Until Discontinued **AND** 0.9 %  sodium chloride infusion, , Intra-arterial, PRN, Agarwala, Ravi, MD .  Place/Maintain arterial line, , , Until Discontinued **AND** 0.9 %  sodium chloride infusion, , Intra-arterial, PRN, Agarwala, Ravi, MD .  acetaminophen (TYLENOL) tablet 650 mg, 650 mg, Oral, Q4H PRN **OR** acetaminophen (TYLENOL) solution 650 mg, 650 mg, Per Tube, Q4H PRN, 650 mg at 01/16/19 0838 **OR** acetaminophen (TYLENOL)  suppository 650 mg, 650 mg, Rectal, Q4H PRN, Amie Portland, MD .  betaxolol (BETOPTIC-S) 0.5 % ophthalmic solution 1 drop, 1 drop, Both Eyes, BID, Minor, Grace Bushy, NP, 1 drop at 01/16/19 1008 .  bisacodyl (DULCOLAX) suppository 10 mg, 10 mg, Rectal, Daily PRN, Agarwala, Ravi, MD .  brinzolamide (AZOPT) 1 % ophthalmic suspension 1 drop, 1 drop, Both Eyes, TID, Minor, Grace Bushy, NP, 1 drop at 01/16/19 0952 .  chlorhexidine gluconate (MEDLINE KIT) (PERIDEX) 0.12 % solution 15 mL, 15 mL, Mouth Rinse, BID, Agarwala, Ravi, MD, 15 mL at 01/16/19 0803 .  Chlorhexidine Gluconate Cloth 2 % PADS 6 each, 6 each, Topical, Daily, Amie Portland, MD, 6 each at 01/16/19 0216 .  desmopressin (DDAVP) 1 mcg in sodium chloride 0.9 % 50 mL IVPB, 1 mcg, Intravenous, BID, Agarwala, Ravi, MD, Last Rate: 100 mL/hr at 01/16/19 1032, 1 mcg at 01/16/19 1032 .  feeding supplement (PRO-STAT SUGAR FREE 64) liquid 30 mL, 30 mL, Per Tube, BID, Consuella Lose, MD, 30 mL at 01/16/19 0951 .  feeding supplement (VITAL HIGH PROTEIN) liquid 1,000 mL, 1,000 mL, Per Tube, Continuous, Consuella Lose, MD, Last Rate: 45 mL/hr at 01/15/19 1359 .  fentaNYL (SUBLIMAZE) injection 50 mcg, 50 mcg, Intravenous, Q1H PRN, Kipp Brood, MD, 25 mcg at 01/15/19 2313 .  free water 400 mL, 400 mL, Per Tube, Q4H, Elsie Lincoln, MD, 400 mL at 01/16/19 0820 .  insulin aspart (novoLOG) injection 0-15 Units, 0-15 Units, Subcutaneous, Q4H, Elsie Lincoln, MD, 2 Units at 01/16/19  0845 .  insulin detemir (LEVEMIR) injection 10 Units, 10 Units, Subcutaneous, BID, Kipp Brood, MD, 10 Units at 01/16/19 0951 .  lactated ringers 1,000 mL with potassium chloride 20 mEq infusion, , Intravenous, Continuous, Agarwala, Ravi, MD, Last Rate: 75 mL/hr at 01/16/19 0800 .  lactated ringers infusion, , Intravenous, Continuous, Roderic Palau, MD .  latanoprost (XALATAN) 0.005 % ophthalmic solution 1 drop, 1 drop, Both Eyes, QHS, Minor, Grace Bushy, NP, 1 drop at  01/15/19 2211 .  levETIRAcetam (KEPPRA) IVPB 1500 mg/ 100 mL premix, 1,500 mg, Intravenous, Q12H, Amie Portland, MD, Stopped at 01/16/19 [phone] .  MEDLINE mouth rinse, 15 mL, Mouth Rinse, 10 times per day, Agarwala, Ravi, MD, 15 mL at 01/16/19 1034 .  midazolam (VERSED) 50 mg/50 mL (1 mg/mL) premix infusion, 0-20 mg/hr, Intravenous, Continuous, Kipp Brood, MD, Stopped at 01/15/19 1731 .  niMODipine (NIMOTOP) capsule 30 mg, 30 mg, Oral, Q2H **OR** niMODipine (NYMALIZE) 6 MG/ML oral solution 30 mg, 30 mg, Per Tube, Q2H, Amie Portland, MD .  norepinephrine (LEVOPHED) 16 mg in 215m premix infusion, 0-40 mcg/min, Intravenous, Titrated, NConsuella Lose MD, Last Rate: 28.1 mL/hr at 01/15/19 2327, 30 mcg/min at 01/15/19 2327 .  pantoprazole (PROTONIX) injection 40 mg, 40 mg, Intravenous, QHS, AAmie Portland MD, 40 mg at 01/15/19 2201 .  phenylephrine (NEOSYNEPHRINE) 40-0.9 MG/250ML-% infusion, 25-200 mcg/min, Intravenous, Titrated, NConsuella Lose MD, Last Rate: 75 mL/hr at 01/16/19 1036, 200 mcg/min at 01/16/19 1036 .  phenytoin (DILANTIN) injection 100 mg, 100 mg, Intravenous, Q8H, AAmie Portland MD, 100 mg at 01/16/19 0604 .  sennosides (SENOKOT) 8.8 MG/5ML syrup 5 mL, 5 mL, Per Tube, BID, PEarnie Larsson MD, 5 mL at 01/16/19 0951 .  sodium chloride flush (NS) 0.9 % injection 10-40 mL, 10-40 mL, Intracatheter, PRN, NConsuella Lose MD Labs CBC    Component Value Date/Time   WBC 15.5 (H) 01/14/2019 0449   RBC 3.45 (L) 01/14/2019 0449   HGB 8.5 (L) 01/14/2019 1806   HCT 25.0 (L) 01/14/2019 1806   PLT 177 01/14/2019 0449   MCV 84.3 01/14/2019 0449   MCH 26.7 01/14/2019 0449   MCHC 31.6 01/14/2019 0449   RDW 13.5 01/14/2019 0449   LYMPHSABS 2.1 01/14/2019 0449   MONOABS 1.0 01/14/2019 0449   EOSABS 0.0 01/14/2019 0449   BASOSABS 0.0 01/14/2019 0449    CMP     Component Value Date/Time   NA 156 (H) 01/16/2019 0557   K 3.4 (L) 01/16/2019 0557   CL 117 (H) 01/16/2019 0557   CO2 26  01/16/2019 0557   GLUCOSE 160 (H) 01/16/2019 0557   BUN 9 01/16/2019 0557   CREATININE 0.65 01/16/2019 0557   CALCIUM 9.1 01/16/2019 0557   PROT 8.2 (H) 01/19/2019 1532   ALBUMIN 4.1 01/06/2019 1532   AST 67 (H) 01/19/2019 1532   ALT 26 01/09/2019 1532   ALKPHOS 65 02/02/2019 1532   BILITOT 0.7 01/28/2019 1532   GFRNONAA >60 01/16/2019 0557   GFRAA >60 01/16/2019 0557   Imaging I have reviewed images in epic and the results pertinent to this consultation are: CTA brain-no evidence of additional hemorrhage with worsened intracranial swelling and right left shift now measuring 9 mm.  Probable infarctions of the right temporal lobe and right frontal operculum. Aneurysm clipping of large right MCA aneurysm with a question of a 3 mm residual component Suspicion of diffuse intracranial spasm because of delayed visualization of venous sinuses possibly due to increased intracranial pressure.  Assessment: 58year old woman with a spontaneous right  MCA aneurysm rupture, ICH along with the subarachnoid hemorrhage from the aneurysm, cerebral edema, seizures with worsening mental status and worsening exam with now right upper extremity weakness.  Dr. Lynetta Mare from [facility] has been working on her blood pressure.  Systolics now running in the 90s. Continues to have fevers likely central etiology.  LTM reviewed till 320 by Dr. Neva Seat seizures.  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 Phenytoin level 8.2.  Last albumin is normal. We will supplement 1 dose and continue 800 mg 3 times daily going forward. Keep off of Versed Discussed with Dr. Kathyrn Sheriff and Dr. Lynetta Mare regarding potential angios and IA with chemical  angioplasty.  Defer to primary team. MRI when able to.   -- Amie Portland, MD [facility] Pager: [phone] If 7pm to 7am, please call on call as listed on AMION.  CRITICAL CARE ATTESTATION Performed by: Amie Portland, MD Total critical care time: 45 minutes Critical care time was exclusive of separately billable procedures and treating other patients and/or supervising APPs/Residents/Students Critical care was necessary to treat or prevent imminent or life-threatening deterioration due to status epilepticus, subarachnoid hemorrhage, intracranial hemorrhage, right MCA aneurysm, delayed cerebral ischemia This patient is critically ill and at significant risk for neurological worsening and/or death and care requires constant monitoring. Critical care was time spent personally by me on the following activities: development of treatment plan with patient and/or surrogate as well as nursing, discussions with consultants, evaluation of patient's response to treatment, examination of patient, obtaining history from patient or surrogate, ordering and performing treatments and interventions, ordering and review of laboratory studies, ordering and review of radiographic studies, pulse oximetry, re-evaluation of patient's condition, participation in multidisciplinary rounds and medical decision making of high complexity in the care of this patient.

## 2019-01-17 ENCOUNTER — Encounter (HOSPITAL_COMMUNITY): Admission: EM | Disposition: E | Payer: Self-pay | Source: Home / Self Care | Attending: Neurosurgery

## 2019-01-17 ENCOUNTER — Inpatient Hospital Stay (HOSPITAL_COMMUNITY): Payer: Medicaid Other

## 2019-01-17 ENCOUNTER — Inpatient Hospital Stay (HOSPITAL_COMMUNITY): Payer: Medicaid Other | Admitting: Anesthesiology

## 2019-01-17 ENCOUNTER — Encounter (HOSPITAL_COMMUNITY): Payer: Self-pay | Admitting: Neurosurgery

## 2019-01-17 DIAGNOSIS — Z9911 Dependence on respirator [ventilator] status: Secondary | ICD-10-CM

## 2019-01-17 DIAGNOSIS — J95851 Ventilator associated pneumonia: Secondary | ICD-10-CM

## 2019-01-17 DIAGNOSIS — R739 Hyperglycemia, unspecified: Secondary | ICD-10-CM

## 2019-01-17 DIAGNOSIS — I609 Nontraumatic subarachnoid hemorrhage, unspecified: Secondary | ICD-10-CM

## 2019-01-17 HISTORY — PX: IR ANGIO INTRA EXTRACRAN SEL INTERNAL CAROTID BILAT MOD SED: IMG5363

## 2019-01-17 HISTORY — PX: RADIOLOGY WITH ANESTHESIA: SHX6223

## 2019-01-17 HISTORY — PX: IR ANGIO VERTEBRAL SEL VERTEBRAL BILAT MOD SED: IMG5369

## 2019-01-17 LAB — URINALYSIS, ROUTINE W REFLEX MICROSCOPIC
Bilirubin Urine: NEGATIVE
Glucose, UA: NEGATIVE mg/dL
Hgb urine dipstick: NEGATIVE
Ketones, ur: NEGATIVE mg/dL
Leukocytes,Ua: NEGATIVE
Nitrite: NEGATIVE
Protein, ur: 30 mg/dL — AB
Specific Gravity, Urine: 1.012 (ref 1.005–1.030)
pH: 6 (ref 5.0–8.0)

## 2019-01-17 LAB — HEPATIC FUNCTION PANEL
ALT: 29 U/L (ref 0–44)
AST: 25 U/L (ref 15–41)
Albumin: 2.2 g/dL — ABNORMAL LOW (ref 3.5–5.0)
Alkaline Phosphatase: 70 U/L (ref 38–126)
Bilirubin, Direct: 0.2 mg/dL (ref 0.0–0.2)
Indirect Bilirubin: 0.4 mg/dL (ref 0.3–0.9)
Total Bilirubin: 0.6 mg/dL (ref 0.3–1.2)
Total Protein: 6.2 g/dL — ABNORMAL LOW (ref 6.5–8.1)

## 2019-01-17 LAB — CBC
HCT: 26.2 % — ABNORMAL LOW (ref 36.0–46.0)
Hemoglobin: 8.4 g/dL — ABNORMAL LOW (ref 12.0–15.0)
MCH: 27.1 pg (ref 26.0–34.0)
MCHC: 32.1 g/dL (ref 30.0–36.0)
MCV: 84.5 fL (ref 80.0–100.0)
Platelets: 137 10*3/uL — ABNORMAL LOW (ref 150–400)
RBC: 3.1 MIL/uL — ABNORMAL LOW (ref 3.87–5.11)
RDW: 14.2 % (ref 11.5–15.5)
WBC: 17.2 10*3/uL — ABNORMAL HIGH (ref 4.0–10.5)
nRBC: 0.3 % — ABNORMAL HIGH (ref 0.0–0.2)

## 2019-01-17 LAB — GLUCOSE, CAPILLARY
Glucose-Capillary: 112 mg/dL — ABNORMAL HIGH (ref 70–99)
Glucose-Capillary: 117 mg/dL — ABNORMAL HIGH (ref 70–99)
Glucose-Capillary: 159 mg/dL — ABNORMAL HIGH (ref 70–99)
Glucose-Capillary: 165 mg/dL — ABNORMAL HIGH (ref 70–99)
Glucose-Capillary: 166 mg/dL — ABNORMAL HIGH (ref 70–99)
Glucose-Capillary: 76 mg/dL (ref 70–99)

## 2019-01-17 LAB — BASIC METABOLIC PANEL
Anion gap: 11 (ref 5–15)
Anion gap: 11 (ref 5–15)
BUN: 11 mg/dL (ref 6–20)
BUN: 9 mg/dL (ref 6–20)
CO2: 25 mmol/L (ref 22–32)
CO2: 24 mmol/L (ref 22–32)
Calcium: 8.5 mg/dL — ABNORMAL LOW (ref 8.9–10.3)
Calcium: 8 mg/dL — ABNORMAL LOW (ref 8.9–10.3)
Chloride: 117 mmol/L — ABNORMAL HIGH (ref 98–111)
Chloride: 115 mmol/L — ABNORMAL HIGH (ref 98–111)
Creatinine, Ser: 0.64 mg/dL (ref 0.44–1.00)
Creatinine, Ser: 0.7 mg/dL (ref 0.44–1.00)
GFR calc Af Amer: >60 mL/min (ref >60)
GFR calc Af Amer: >60 mL/min (ref >60)
GFR calc non Af Amer: >60 mL/min (ref >60)
GFR calc non Af Amer: >60 mL/min (ref >60)
Glucose, Bld: 135 mg/dL — ABNORMAL HIGH (ref 70–99)
Glucose, Bld: 137 mg/dL — ABNORMAL HIGH (ref 70–99)
Potassium: 2.7 mmol/L — CL (ref 3.5–5.1)
Potassium: 3.1 mmol/L — ABNORMAL LOW (ref 3.5–5.1)
Sodium: 153 mmol/L — ABNORMAL HIGH (ref 135–145)
Sodium: 150 mmol/L — ABNORMAL HIGH (ref 135–145)

## 2019-01-17 SURGERY — IR WITH ANESTHESIA
Anesthesia: General

## 2019-01-17 MED ORDER — VANCOMYCIN HCL 10 G IV SOLR
2000.0000 mg | Freq: Once | INTRAVENOUS | Status: AC
  Administered 2019-01-17: 2000 mg via INTRAVENOUS
  Filled 2019-01-17: qty 2000

## 2019-01-17 MED ORDER — ROCURONIUM BROMIDE 50 MG/5ML IV SOSY
PREFILLED_SYRINGE | INTRAVENOUS | Status: DC | PRN
  Administered 2019-01-17: 50 mg via INTRAVENOUS

## 2019-01-17 MED ORDER — SODIUM CHLORIDE 0.9 % IV SOLN
2000.0000 mg | Freq: Two times a day (BID) | INTRAVENOUS | Status: DC
  Administered 2019-01-17 – 2019-01-21 (×10): 2000 mg via INTRAVENOUS
  Filled 2019-01-17 (×12): qty 20

## 2019-01-17 MED ORDER — GLYCOPYRROLATE PF 0.2 MG/ML IJ SOSY: PREFILLED_SYRINGE | INTRAMUSCULAR | Status: DC | PRN

## 2019-01-17 MED ORDER — IOHEXOL 300 MG/ML  SOLN
150.0000 mL | Freq: Once | INTRAMUSCULAR | Status: AC | PRN
  Administered 2019-01-17: 30 mL via INTRA_ARTERIAL

## 2019-01-17 MED ORDER — SODIUM CHLORIDE 0.9 % IV SOLN
2.0000 g | Freq: Three times a day (TID) | INTRAVENOUS | Status: DC
  Filled 2019-01-17 (×2): qty 2

## 2019-01-17 MED ORDER — FENTANYL CITRATE (PF) 100 MCG/2ML IJ SOLN
INTRAMUSCULAR | Status: DC | PRN
  Administered 2019-01-17 (×2): 25 ug via INTRAVENOUS
  Administered 2019-01-17: 50 ug via INTRAVENOUS

## 2019-01-17 MED ORDER — POTASSIUM CHLORIDE 20 MEQ PO PACK
40.0000 meq | PACK | Freq: Once | ORAL | Status: AC
  Administered 2019-01-17: 40 meq
  Filled 2019-01-17: qty 2

## 2019-01-17 MED ORDER — POTASSIUM CHLORIDE 10 MEQ/50ML IV SOLN
10.0000 meq | INTRAVENOUS | Status: AC
  Administered 2019-01-17 (×6): 10 meq via INTRAVENOUS
  Filled 2019-01-17 (×6): qty 50

## 2019-01-17 MED ORDER — POTASSIUM CHLORIDE 20 MEQ PO PACK: 40.0000 meq | PACK | Freq: Once | ORAL | Status: DC

## 2019-01-17 MED ORDER — SODIUM CHLORIDE 0.9 % IV SOLN
INTRAVENOUS | Status: DC | PRN
  Administered 2019-01-17: 12:00:00 via INTRAVENOUS

## 2019-01-17 MED ORDER — MIDAZOLAM HCL 5 MG/5ML IJ SOLN: INTRAMUSCULAR | Status: DC | PRN

## 2019-01-17 MED ORDER — HEPARIN SODIUM (PORCINE) 1000 UNIT/ML IJ SOLN
INTRAMUSCULAR | Status: DC | PRN
  Administered 2019-01-17: 2000 [IU] via INTRAVENOUS

## 2019-01-17 MED ORDER — INSULIN DETEMIR 100 UNIT/ML ~~LOC~~ SOLN
15.0000 [IU] | Freq: Two times a day (BID) | SUBCUTANEOUS | Status: DC
  Administered 2019-01-17 – 2019-01-26 (×18): 15 [IU] via SUBCUTANEOUS
  Filled 2019-01-17 (×19): qty 0.15

## 2019-01-17 MED ORDER — PIPERACILLIN-TAZOBACTAM 3.375 G IVPB
3.3750 g | Freq: Three times a day (TID) | INTRAVENOUS | Status: DC
  Administered 2019-01-17 – 2019-01-19 (×6): 3.375 g via INTRAVENOUS
  Filled 2019-01-17 (×5): qty 50

## 2019-01-17 MED ORDER — SODIUM CHLORIDE 0.9 % IV SOLN
INTRAVENOUS | Status: DC | PRN
  Administered 2019-01-17: 110 ug/min via INTRAVENOUS

## 2019-01-17 MED ORDER — VANCOMYCIN HCL IN DEXTROSE 750-5 MG/150ML-% IV SOLN
750.0000 mg | Freq: Two times a day (BID) | INTRAVENOUS | Status: DC
  Administered 2019-01-17 – 2019-01-18 (×3): 750 mg via INTRAVENOUS
  Filled 2019-01-17 (×4): qty 150

## 2019-01-17 MED ORDER — SODIUM CHLORIDE 0.9 % IV SOLN
INTRAVENOUS | Status: DC | PRN
  Administered 2019-01-17: 50 ug/min via INTRAVENOUS

## 2019-01-17 MED ORDER — VERAPAMIL HCL 2.5 MG/ML IV SOLN
Freq: Once | INTRAVENOUS | Status: DC
  Filled 2019-01-17: qty 8

## 2019-01-17 NOTE — Progress Notes (Signed)
PT Cancellation Note/ DISCHARGE  Patient Details Name: Jessica Mayer MRN: [id number] DOB: 03/12/1961   Cancelled Treatment:    Reason Eval/Treat Not Completed: Patient not medically ready. RN reports pt not medically progressing. PT to sign off for now as patient not appropriate to participate in PT. Please re-consult if/when patient appropriate to advance mobility.  Kittie Plater, PT, DPT Acute Rehabilitation Services Pager #: [phone] Office #: [phone]    Berline Lopes 01/13/2019, 9:31 AM

## 2019-01-17 NOTE — Progress Notes (Signed)
[facility] Progress Note Patient Name: Jessica Mayer DOB: [birth year] MRN: [id number]   Date of Service  01/20/2019  HPI/Events of Note  K+ = 2.7 and Creatinine = 0.64.   eICU Interventions  Will replace K+.      Intervention Category Major Interventions: Electrolyte abnormality - evaluation and management  Sommer,Steven Eugene 01/11/2019, 6:14 AM

## 2019-01-17 NOTE — Progress Notes (Signed)
LTM reviewed till 41. No seizures seen.  Please review final report for details.

## 2019-01-17 NOTE — Progress Notes (Signed)
EEG Maintenance complete. F7 P3 F3 T4 FP2 FP1 No skin breakdown noted. Continue to monitor

## 2019-01-17 NOTE — Progress Notes (Signed)
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  AEDs initiated. 01/15/2019 -blood pressure augmentation for suspected vasospasm STUDIES:   01/09/2019 CT scans revealing a right MCA aneurysm and hematoma 01/15/2019 CT scan shows stable residual hematoma but increased surrounding edema. CULTURES:  MRSA negative SARS-CoV-2 negative  ANTIBIOTICS:  None  LINES/TUBES:  01/29/2019 endotracheal tube>> 01/11/2019  CONSULTANTS:  02/01/2019 pulmonary critical care SUBJECTIVE:  No further seizures overnight.  Not moving right upper extremity today.  CONSTITUTIONAL: BP (!) 146/94   Pulse (!) 130   Temp (!) 100.6 F (38.1 C) (Axillary)   Resp (!) 28   Ht 5\' 5"  (1.651 m)   Wt 85.6 kg   LMP 05/29/2010   SpO2 100%   BMI 31.40 kg/m   I/O last 3 completed shifts: In: 12183 [I.V.:7757.8; GR:[id number]; IV Piggyback:460.2] Out: [id number] [Urine:9755; Stool:850]     Vent Mode: PRVC FiO2 (%):  [50 %-60 %] 60 % Set Rate:  [18 bmp] 18 bmp Vt Set:  [450 mL] 450 mL PEEP:  [5 cmH20-8 cmH20] 5 cmH20 Plateau Pressure:  [15 [id number] cmH20] 20  cmH20  PHYSICAL EXAM: General: Adult female intubated and urnesponsive HEENT: R periorbital edema. PERRL   Neuro: Recently received PRN fentanyl.  Non eye opening, no movement in UE, RLE with small contraction with painful stimulation.  CV: SR on telemetry, warm and well perfused. PULM: Mech vent. Scattered rhonchi, copious yellow secretions. GI: soft, NT, ND Extremities: Warm, no deformities  RESOLVED PROBLEM LIST   ASSESSMENT AND PLAN   Acute encephalopathy following subarachnoid hemorrhage and right MCA aneurysm clipping. P: -SBP Goal: 190-200. Neo for BP augmentation.  [facility] protoocol: Goal euvolemia - +, Nimotop, TCD's -Cerebral angiography today.  -Fever/infectious process may be additionally contributing to encephalopathy  Fever: P: -Pan culture: Blood Cx, UA, Respiratory culture, C Diff.  CXR pending -Suspected HCAP source. CNS fever also in ddx.  Empiric abx: Vanc/Zosyn  Tachycardia P:  -Multifactorial: medications, infectious process.  D/c milrinone and start Neo for BP augmentation. Treatment for infectious process, see above  Status epilepticus -No evidence of seizures on EEG on 9/13, will follow up today -AED's per neuro: LEV, PHT.  May have ongoing subclinical seizures.   Hypernatremia from iatrogenic 3% saline and diabetes insipidus -FWF: 400 ml q 8 hours -foley for I/O -Last dose DDAVP 9/13 -Trend NA, allow permissive hypernatremia in setting of SAH  Hyperglycemia due to stress -SSI & Basal insulin  History of glaucoma -On glucoma medications restarted 01/13/2019  Hypokalemia -Replaced.     Best Practice / Goals of Care / Disposition.   DVT PROPHYLAXIS:SCD SUP: PPI NUTRITION: N.p.o -tube feeds. MOBILITY: Bedrest GOALS OF CARE: Full code FAMILY DISCUSSIONS: Updated by primary team DISPOSITION: ICU  on mechanical ventilation  LABS  Glucose Recent Labs  Lab 01/16/19 0813 01/16/19 1214 01/16/19 1509 01/16/19 1953 01/16/19 2354 01/22/2019 0344   GLUCAP 131* 153* 138* 203* 207* 112*   Ct Angio Head W Or Wo Contrast  Result Date: 01/16/2019 CLINICAL DATA:  Follow-up intracranial hemorrhage. Right MCA aneurysm previously demonstrated. 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 Clipping of right MCA aneurysm. EXAM: CT ANGIOGRAPHY HEAD TECHNIQUE: Multidetector CT imaging of the head was performed using the standard protocol during bolus administration of intravenous contrast. Multiplanar CT image reconstructions and MIPs were obtained to evaluate the vascular anatomy. CONTRAST:  57mL OMNIPAQUE IOHEXOL 350 MG/ML SOLN COMPARISON:  01/15/2019 FINDINGS: CT HEAD Brain: No increased hemorrhage in the right hemisphere. Postoperative hematoma and air as seen previously. Infarction of the right temporal lobe and right frontal operculum as seen previously. No evidence of extension otherwise. Increased mass effect with right-to-left shift now measuring 9 mm. Small amount of subarachnoid blood and intraventricular blood appears the same. Vascular: See below Skull: Pterional craniotomy. Sinuses: Clear Orbits: Right periorbital soft tissue swelling. 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 Clipping of previously seen large right MCA aneurysm. Question 3 mm residual component as marked on the axial imaging. Question spasm of more distal branch vessels. On the left, the ICA is patent. Anterior and middle cerebral vessels are patent. There may also be a degree of spasm. Again, question 2 mm anterior communicating region aneurysm. Posterior circulation: Both vertebral arteries are patent to the basilar. Question spasm of the posterior circulation branch vessels. No evidence of large or medium vessel occlusion. No posterior circulation aneurysm. Venous sinuses: Early opacification of the venous system. This could be delayed due to increased intracranial pressure. Anatomic  variants: None other. IMPRESSION: No evidence of additional intracranial hemorrhage. Worsened intracranial swelling with right-to-left shift now measuring 9 mm. Probable infarctions in the right temporal lobe and right frontal operculum. Aneurysm clipping of large right MCA aneurysm with question of a 3 mm residual component. Suspicion of diffuse intracranial spasm. Delayed visualization of the venous sinuses possibly due to increased intracranial pressure. Electronically Signed   By: Nelson Chimes M.D.   On: 01/16/2019 15:08   Ct Head Wo Contrast  Result Date: 01/15/2019 CLINICAL DATA:  57 year old female status post surgical clipping of ruptured right MCA bifurcation aneurysm, postoperative day 3. EXAM: CT HEAD WITHOUT CONTRAST TECHNIQUE: Contiguous axial images were obtained from the base of the skull through the vertex without intravenous contrast. COMPARISON:  Head CTs 01/10/2019 and earlier. FINDINGS: Brain: Decreased pneumocephalus with small volume residual. Gas and hemorrhage at the right operculum. Superimposed confluent cytotoxic edema in the anterior right temporal lobe appears increased. Similar increased confluent hypodensity in the anterior right insula. Small volume intraventricular hemorrhage is stable. Small volume right convexity. Subarachnoid hemorrhage is stable. Improved basilar cistern patency. But mass effect on the right lateral ventricle and leftward midline shift appear mildly increased (midline shift now 6 millimeters, previously 4 millimeters). Stable gray-white matter differentiation in the left hemisphere and posterior fossa. No ventriculomegaly. Vascular: Right MCA M1 region aneurysm clip with stable configuration. Skull: Stable. Right frontotemporal and orbital frontal craniotomy changes. There seems to be a superimposed nondisplaced right orbital roof fracture. No new osseous abnormality. Sinuses/Orbits: Left nasoenteric tube remains in place. Stable mild paranasal sinus mucosal  thickening. Tympanic cavities and mastoids remain clear. Other: Stable postoperative changes to the scalp. Persistent right para but ULs oft  tissue swelling but decreased right orbital gas. IMPRESSION: 1. Increased edema in the right temporal lobe and operculum since 01/16/2019 along with leftward midline shift, now 6 mm (previously 4 mm). At the same time basilar cistern patency appears mildly improved. 2. Decreased pneumocephalus. Stable residual intracranial hemorrhage. No ventriculomegaly. 3. Stable right MCA region aneurysm clip. Electronically Signed   By: Genevie Ann M.D.   On: 01/15/2019 22:25   Vas [place] Transcranial Doppler  Result Date: 01/16/2019  Transcranial Doppler Indications: Subarachnoid hemorrhage. Limitations for diagnostic windows: Unable to insonate left transtemporal window. Comparison Study: pervious study done 01/14/19 Performing Technologist: Abram Sander RVS  Examination Guidelines: A complete evaluation includes B-mode imaging, spectral Doppler, color Doppler, and power Doppler as needed of all accessible portions of each vessel. Bilateral testing is considered an integral part of a complete examination. Limited examinations for reoccurring indications may be performed as noted.  +----------+-------------+----------+-----------+-------+ RIGHT TCD Right VM (cm)Depth (cm)PulsatilityComment +----------+-------------+----------+-----------+-------+ MCA           43.00                 1.01            +----------+-------------+----------+-----------+-------+ Term ICA      43.00                 1.23            +----------+-------------+----------+-----------+-------+ Opthalmic     25.00                 1.21            +----------+-------------+----------+-----------+-------+ ICA siphon    37.00                 1.04            +----------+-------------+----------+-----------+-------+  +----------+------------+----------+-----------+-------+ LEFT TCD  Left VM (cm)Depth  (cm)PulsatilityComment +----------+------------+----------+-----------+-------+ Opthalmic    17.00                 1.57            +----------+------------+----------+-----------+-------+ ICA siphon   51.00                 1.22            +----------+------------+----------+-----------+-------+  Summary:  Absent left tempral and occipital windows limit evaluation. Normal mean flow velocities in right middle cerebral, bilateral opthalmics and carotid siphons.mildly elevated pulsatility indices suggest diffuse intracranial atherosclerosis or increased intracranial pressure *See table(s) above for measurements and observations.  Diagnosing physician: Antony Contras MD Electronically signed by Antony Contras MD on 01/16/2019 at 12:09:07 PM.    Final     BMET Recent Labs  Lab 01/16/19 0557 01/16/19 1831 01/20/2019 0522  NA 156* 154* 153*  K 3.4* 2.9* 2.7*  CL 117* 115* 117*  CO2 26 24 25   BUN 9 9 11   CREATININE 0.65 0.71 0.64  GLUCOSE 160* 164* 135*    Liver Enzymes Recent Labs  Lab 01/24/2019 1532  AST 67*  ALT 26  ALKPHOS 65  BILITOT 0.7  ALBUMIN 4.1    Electrolytes Recent Labs  Lab 01/13/19 1508 01/14/19 0449  01/15/19 0432  01/16/19 0557 01/16/19 1831 01/16/2019 0522  CALCIUM 8.1* 8.6*   < > 8.7*   < > 9.1 8.7* 8.5*  MG 1.9 1.8  --  1.6*  --   --   --   --   PHOS 1.3* 1.8*  --  2.7  --   --   --   --    < > =  values in this interval not displayed.    CBC Recent Labs  Lab 01/13/2019 1532 01/14/19 0449 01/14/19 1806  WBC 16.4* 15.5*  --   HGB 13.5 9.2* 8.5*  HCT 40.4 29.1* 25.0*  PLT 233 177  --     ABG No results for input(s): PHART, PCO2ART, PO2ART in the last 168 hours.  Coag's Recent Labs  Lab 01/30/2019 1532  INR 1.1    Sepsis Markers Recent Labs  Lab 01/21/2019 1600 01/12/2019 2018  LATICACIDVEN 1.5 1.9    Cardiac Enzymes No results for input(s): TROPONINI, PROBNP in the last 168 hours.   CRITICAL CARE: 40 min Performed by: Isla Pence, ACNP [facility]  Pager: [phone]

## 2019-01-17 NOTE — Progress Notes (Signed)
Pharmacy Antibiotic Note  Jessica Mayer is a 58 y.o. female admitted on 02/01/2019 with pneumonia.  Pharmacy has been consulted for Vancomycin and Zosyn dosing.  Patient has had a consistent fever with Tmax of 102.4. WBC are elevated at 17.2. Renal function stable with Scr 0.64 and UOP of 5.3L on 9/13. CXR this AM shows right base atelectasis with low lung volumes.   Plan: Vancomycin 2,000 mg IV loading dose, then 750 mg IV Q 12 hrs.  - Goal AUC 400-550. - Expected AUC: 472 (Vd 0.5) - SCr used: 0.64 Zosyn 3.375 mg IV Q8h (4hr infusion) Monitor cultures, clinical progression, renal function  Height: 5\' 5"  (165.1 cm) Weight: 188 lb 11.4 oz (85.6 kg) IBW/kg (Calculated) : 57  Temp (24hrs), Avg:101.6 F (38.7 C), Min:100.3 F (37.9 C), Max:102.9 F (39.4 C)  Recent Labs  Lab 01/20/2019 1532 02/02/2019 1600 01/07/2019 2018  01/14/19 0449  01/15/19 0432 01/15/19 1636 01/16/19 0557 01/16/19 1831 01/23/2019 0522 01/21/2019 0924  WBC 16.4*  --   --   --  15.5*  --   --   --   --   --   --  17.2*  CREATININE 0.93  --   --    < > 0.60   < > 0.69 0.65 0.65 0.71 0.64  --   LATICACIDVEN  --  1.5 1.9  --   --   --   --   --   --   --   --   --    < > = values in this interval not displayed.    Estimated Creatinine Clearance: 80.8 mL/min (by C-G formula based on SCr of 0.64 mg/dL).    Allergies  Allergen Reactions  . Asa Buff (Mag [Buffered Aspirin] Nausea And Vomiting    Antimicrobials this admission: Vancomycin 9/14 >>  Zosyn 9/14 >>   Dose adjustments this admission: None  Microbiology results: 9/14 BCx: sent  9/14 Sputum: mod GPC, few GNC, rare GNR, rare GPR  9/8 MRSA PCR: neg  Thank you for allowing pharmacy to be a part of this patient's care.  Richardine Service 01/08/2019 1:27 PM

## 2019-01-17 NOTE — Progress Notes (Signed)
Transcranial Doppler  Date POD PCO2 HCT BP  MCA ACA PCA OPHT SIPH VERT Basilar  9/11 JP     Right  Left   *  *   *  *   *  *   33  36   48  *   *  *   -35      9/13 MR     Right  Left   43  *   *  *   *  *   25  17   37  51   *  *   *      9/14 JP     Right  Left   *  *   *  *   *  *   30  24   52  76   *  *   *            Right  Left                                             Right  Left                                            Right  Left                                            Right  Left                                        MCA = Middle Cerebral Artery      OPHT = Opthalmic Artery     BASILAR = Basilar Artery   ACA = Anterior Cerebral Artery     SIPH = Carotid Siphon PCA = Posterior Cerebral Artery   VERT = Verterbral Artery                   Normal MCA = 62+\-12 ACA = 50+\-12 PCA = 42+\-23   * = not insonated/ visualized.   June Leap, BS, RDMS, RVT

## 2019-01-17 NOTE — Progress Notes (Addendum)
Reason for consult: seizure, MCA aneurysm s/p clipping  Subjective: Patient had 5 seizures since yesterday noon to this morning 8am which is a significant improvement ( 8-10/hr earlier).   ROS: Unable to obtain due to poor mental status, intubation  Examination  Vital signs in last 24 hours: Temp:  [100.3 F (37.9 C)-102.9 F (39.4 C)] 101 F (38.3 C) (09/14 0838) Pulse Rate:  [105-144] 137 (09/14 0930) Resp:  [23-33] 30 (09/14 0930) BP: (121-189)/(71-107) 121/71 (09/14 0930) SpO2:  [88 %-100 %] 96 % (09/14 0930) Arterial Line BP: (128-222)/(68-115) 128/68 (09/14 0930) FiO2 (%):  [50 %-60 %] 60 % (09/14 0829)  General: lying in bed, NAD, right eye edematous CVS: pulse-normal rate and rhythm RS: breathing comfortably Extremities: norma, warm  Neuro: MS: intubated, barely opens eyes to noxious stimuli, doesn't follow commands CN: pupils equal and reactive, oculocephalic intact, corneal reflex intact, gag +ve, rest CN unable to assess sec to intubation Motor and sensory: only 2/5 in RLE after noxious stimuli in all extremities, flaccid in all extremities except RLE   Basic Metabolic Panel: Recent Labs  Lab 01/09/2019 1552 01/13/19 0430  01/13/19 1508  01/14/19 0449  01/15/19 0432 01/15/19 0647 01/15/19 1636 01/16/19 0557 01/16/19 1831 01/22/2019 0522  NA 156*  --    < > 163*   < > 164*   < > 160* 159* 158* 156* 154* 153*  K  --   --   --  2.2*  --  2.5*   < > 3.8  --  3.3* 3.4* 2.9* 2.7*  CL  --   --   --  >130*  --  >130*   < > 124*  --  122* 117* 115* 117*  CO2  --   --   --  21*  --  25   < > 26  --  26 26 24 25   GLUCOSE  --   --   --  197*  --  166*   < > 295*  --  221* 160* 164* 135*  BUN  --   --   --  7  --  7   < > 8  --  9 9 9 11   CREATININE  --   --   --  0.70  --  0.60   < > 0.69  --  0.65 0.65 0.71 0.64  CALCIUM  --   --   --  8.1*  --  8.6*   < > 8.7*  --  8.9 9.1 8.7* 8.5*  MG 1.3* 1.9  --  1.9  --  1.8  --  1.6*  --   --   --   --   --   PHOS 1.2* 1.6*  --   1.3*  --  1.8*  --  2.7  --   --   --   --   --    < > = values in this interval not displayed.    CBC: Recent Labs  Lab 01/27/2019 1532 01/14/19 0449 01/14/19 1806  WBC 16.4* 15.5*  --   NEUTROABS 14.2* 12.2*  --   HGB 13.5 9.2* 8.5*  HCT 40.4 29.1* 25.0*  MCV 81.0 84.3  --   PLT 233 177  --      Coagulation Studies: No results for input(s): LABPROT, INR in the last 72 hours.  Imaging CT head 01/15/2019:  1. Increased edema in the right temporal lobe and operculum since 01/22/2019 along with leftward midline  shift, now 6 mm (previously 4 mm). At the same time basilar cistern patency appears mildly improved. 2. Decreased pneumocephalus. Stable residual intracranial hemorrhage. No ventriculomegaly. 3. Stable right MCA region aneurysm clip  CTA head 01/16/2019: No evidence of additional intracranial hemorrhage. Worsened intracranial swelling with right-to-left shift now measuring 9 mm. Probable infarctions in the right temporal lobe and right frontal operculum.  Aneurysm clipping of large right MCA aneurysm with question of a 3 mm residual component.  Suspicion of diffuse intracranial spasm. Delayed visualization of the venous sinuses possibly due to increased intracranial pressure.  ASSESSMENT AND PLAN  58 year old woman with a spontaneous right MCA aneurysm rupture, ICH along with the subarachnoid hemorrhage from the aneurysm, cerebral edema, seizures with worsening mental status and worsening exam with now right upper extremity weakness.    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 Level 8.2 yesterday. Consulted pharmacy to adjust maintenance dosing with goal 15-20. Will check trough levels and albumin  prior to adjusting - Of note, patient has severe structural underlying abnormality which led to status. Therefore, our goal is to minimize seizures without causing excessive sedation and other side effects. We will optimize current AEDs with that goal in mind and not try to treat every subclinical seizure.  - Per Neurosurg notes, plan for angiogram today and if spasm will attempt chemical angioplasty - seizure precautions  - PRN IV ativan only for GTC>2 mins    CRITICAL CARE Performed by: Lora Havens  Total critical care time: 35 minutes  Critical care time was exclusive of separately billable procedures and treating other patients.  Critical care was necessary to treat or prevent imminent or life-threatening deterioration.  Critical care was time spent personally by me on the following activities: development of treatment plan with patient and/or surrogate as well as nursing, discussions with consultants, evaluation of patient's response to treatment, examination of patient, obtaining history from patient or surrogate, ordering and performing treatments and interventions, ordering and review of laboratory studies, ordering and review of radiographic studies, pulse oximetry and re-evaluation of patient's condition.

## 2019-01-17 NOTE — Transfer of Care (Signed)
Immediate Anesthesia Transfer of Care Note  Patient: Camary Bolanle Bow  Procedure(s) Performed: IR WITH ANESTHESIA/ Embolization (N/A )  Patient Location: ICU  Anesthesia Type:General  Level of Consciousness: Patient remains intubated per anesthesia plan  Airway & Oxygen Therapy: Patient remains intubated per anesthesia plan and Patient placed on Ventilator (see vital sign flow sheet for setting)  Post-op Assessment: Report given to RN and Post -op Vital signs reviewed and stable  Post vital signs: Reviewed and stable  Last Vitals:  Vitals Value Taken Time  BP    Temp    Pulse 112 01/20/2019 1322  Resp 20 01/31/2019 1322  SpO2 100 % 01/16/2019 1322  Vitals shown include unvalidated device data.  Last Pain:  Vitals:   01/16/2019 0838  TempSrc: Axillary         Complications: No apparent anesthesia complications

## 2019-01-17 NOTE — Progress Notes (Signed)
MEDICATION RELATED CONSULT NOTE - FOLLOW UP  Pharmacy Consult for Phenytoin Indication: seizures  Allergies  Allergen Reactions  . Asa Buff (Mag [Buffered Aspirin] Nausea And Vomiting    Patient Measurements: Height: '5\' 5"'$  (165.1 cm) Weight: 188 lb 11.4 oz (85.6 kg) IBW/kg (Calculated) : 57  Adjusted body weight: 68.6 kg  Vital Signs: Temp: 101 F (38.3 C) (09/14 0838) Temp Source: Axillary (09/14 0838) BP: 147/88 (09/14 0800) Pulse Rate: 130 (09/14 0803) Intake/Output from previous day: 09/13 0701 - 09/14 0700 In: 7009.2 [I.V.:4424; SE/GB:1517; IV Piggyback:260.2] Out: 6105 [Urine:5255; Stool:850] Intake/Output from this shift: Total I/O In: 313.6 [I.V.:228.5; NG/GT:45; IV Piggyback:40.1] Out: 350 [Urine:350]  Labs: Recent Labs    01/14/19 1806  01/15/19 0432  01/16/19 0557 01/16/19 1831 01/16/2019 0522  HGB 8.5*  --   --   --   --   --   --   HCT 25.0*  --   --   --   --   --   --   CREATININE  --    < > 0.69   < > 0.65 0.71 0.64  MG  --   --  1.6*  --   --   --   --   PHOS  --   --  2.7  --   --   --   --    < > = values in this interval not displayed.   Estimated Creatinine Clearance: 80.8 mL/min (by C-G formula based on SCr of 0.64 mg/dL).   Medications:  Scheduled:  . sodium chloride   Intravenous Once  . betaxolol  1 drop Both Eyes BID  . brinzolamide  1 drop Both Eyes TID  . chlorhexidine gluconate (MEDLINE KIT)  15 mL Mouth Rinse BID  . Chlorhexidine Gluconate Cloth  6 each Topical Daily  . feeding supplement (PRO-STAT SUGAR FREE 64)  30 mL Per Tube BID  . fentaNYL (SUBLIMAZE) injection  50 mcg Intravenous Once  . free water  400 mL Per Tube Q8H  . insulin aspart  0-15 Units Subcutaneous Q4H  . insulin detemir  10 Units Subcutaneous BID  . latanoprost  1 drop Both Eyes QHS  . mouth rinse  15 mL Mouth Rinse 10 times per day  . niMODipine  30 mg Oral Q2H   Or  . niMODipine  30 mg Per Tube Q2H  . pantoprazole (PROTONIX) IV  40 mg Intravenous QHS   . phenytoin (DILANTIN) IV  100 mg Intravenous Q8H  . sennosides  5 mL Per Tube BID    Assessment: 58 YO female presenting with AMS found to have [place] s/p MCA aneurysm. EEG on 9/12 showed 10 seizures from R frontocentral region with new epileptogenicity in L frontal region. Patient was started on phenytoin 100 mg Q8h on 9/11. Pharmacy consulted to assist with maintenance dosing of phenytoin.  Phenytoin level on 9/13 was low at 8.2 mcg/ml, albumin 4.1. Scr stable at 0.64. AST slightly elevated at 67, remaining LFT wnl.   Drug Interactions with Phenytoin:  - Phenytoin is a strong CYP3A4 inducer and nimodipine is extensively metabolized by CYP3A4. This interaction causes markedly reduced nimodipine levels.  Goal of Therapy:  Phenytoin level 15-20 mcg/mL  Plan:  - Check new phenytoin trough and albumin tomorrow morning - Monitor renal function, LFTs, albumin - Follow-up phenytoin-nimodipine DDI   Richardine Service, PharmD PGY1 Pharmacy Resident Phone: [phone] 01/15/2019  9:21 AM  Please check [email or web address] for unit-specific pharmacy phone numbers.

## 2019-01-17 NOTE — Brief Op Note (Signed)
  NEUROSURGERY BRIEF OPERATIVE  NOTE   PREOP DX: Subarachnoid Hemorrhage  POSTOP DX: Same  PROCEDURE: Diagnostic cerebral angiogram  SURGEON: Dr. Consuella Lose, MD  ANESTHESIA: GETA  EBL: Minimal  SPECIMENS: None  COMPLICATIONS: None  CONDITION: Stable to ICU  FINDINGS (Full report in PACS): 1. Minimal vasospasm involving the intracranial circulation, no significant flow limitation or perfusion deficit seen. 2. 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

## 2019-01-17 NOTE — Procedures (Signed)
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 EEG to evaluate for seizures  Level of alertness:comatose  AEDs during EEG study:LEV, PHT  Technical aspects: This EEG study was done with scalp electrodes positioned according to the 10-20 International system of electrode placement. Electrical activity was acquired at a sampling rate of 500Hz  and reviewed with a high frequency filter of 70Hz  and a low frequency filter of 1Hz . EEG data were recorded continuously and digitally stored.  DESCRIPTION: EEG showed5 seizureswith no clinical signs,arising from right frontocentral region lasting 1-1.5 minutes, last seizure on 01/16/2019 at 2036.There were also periodic epileptiform discharges at therateof 1hz  in right frontocentral region.  Additionally, there was also continuous generalized 2-5hz  theta-delta slowing, maximal right hemisphere.Hyperventilation and photic stimulation were not performed.   IMPRESSION: This studyshowed 5 seizures arising from right frontocentral region with no clinical signs.  Additionally, there is cortical dysfunction in right hemisphere consistent with prior craniotomy as well as severe diffuse encephalopathy.  EEG appears improved compared to previous study.

## 2019-01-17 NOTE — Progress Notes (Addendum)
Transcranial Doppler  Date POD PCO2 HCT BP  MCA ACA PCA OPHT SIPH VERT Basilar  9/11 JP     Right  Left   *  *   *  *   *  *   33  36   48  *   *  *   -35      9/13 MR     Right  Left   43  *   *  *   *  *   25  17   37  51   *  *   *           Right  Left                                             Right  Left                                             Right  Left                                            Right  Left                                            Right  Left                                        MCA = Middle Cerebral Artery      OPHT = Opthalmic Artery     BASILAR = Basilar Artery   ACA = Anterior Cerebral Artery     SIPH = Carotid Siphon PCA = Posterior Cerebral Artery   VERT = Verterbral Artery                   Normal MCA = 62+\-12 ACA = 50+\-12 PCA = 42+\-23   * = not insonated/ visualized. June Leap, BS, RDMS, RVT

## 2019-01-17 NOTE — Anesthesia Preprocedure Evaluation (Signed)
Anesthesia Evaluation  Patient identified by MRN, date of birth, ID band Patient unresponsive    Reviewed: Allergy & Precautions, H&P , NPO status , Patient's Chart, lab work & pertinent test results  Airway Mallampati: Intubated  TM Distance: >3 FB Neck ROM: Full    Dental no notable dental hx. (+) Teeth Intact, Dental Advisory Given   Pulmonary asthma ,    Pulmonary exam normal breath sounds clear to auscultation       Cardiovascular hypertension,  Rhythm:Regular Rate:Normal     Neuro/Psych  Headaches, Anxiety S/p crani for ICH CVA, Residual Symptoms    GI/Hepatic negative GI ROS, Neg liver ROS,   Endo/Other  negative endocrine ROS  Renal/GU negative Renal ROS  negative genitourinary   Musculoskeletal  (+) Arthritis , Osteoarthritis,    Abdominal   Peds  Hematology negative hematology ROS (+)   Anesthesia Other Findings   Reproductive/Obstetrics negative OB ROS                             Lab Results  Component Value Date   WBC 17.2 (H) 01/06/2019   HGB 8.4 (L) 01/18/2019   HCT 26.2 (L) 01/19/2019   MCV 84.5 01/20/2019   PLT 137 (L) 01/19/2019   Lab Results  Component Value Date   CREATININE 0.64 01/29/2019   BUN 11 01/10/2019   NA 153 (H) 01/24/2019   K 2.7 (LL) 01/19/2019   CL 117 (H) 01/27/2019   CO2 25 01/09/2019    Anesthesia Physical  Anesthesia Plan  ASA: IV and emergent  Anesthesia Plan: General   Post-op Pain Management:    Induction: Intravenous  PONV Risk Score and Plan: 3 and Ondansetron  Airway Management Planned: Oral ETT  Additional Equipment: Arterial line  Intra-op Plan:   Post-operative Plan: Post-operative intubation/ventilation  Informed Consent: I have reviewed the patients History and Physical, chart, labs and discussed the procedure including the risks, benefits and alternatives for the proposed anesthesia with the patient or  authorized representative who has indicated his/her understanding and acceptance.     Dental advisory given  Plan Discussed with: CRNA  Anesthesia Plan Comments:         Anesthesia Quick Evaluation

## 2019-01-17 NOTE — Progress Notes (Deleted)
[facility] ADULT ICU REPLACEMENT PROTOCOL FOR AM LAB REPLACEMENT ONLY  The patient does apply for the [facility] Adult ICU Electrolyte Replacment Protocol based on the criteria listed below:   1. Is GFR >/= 40 ml/min? Yes.    Patient's GFR today is >60 2. Is urine output >/= 0.5 ml/kg/hr for the last 6 hours? Yes.   Patient's UOP is 1.67 ml/kg/hr 3. Is BUN < 60 mg/dL? Yes.    Patient's BUN today is 11 4. Abnormal electrolyte  K2.7 5. Ordered repletion with: protocol 6. If a panic level lab has been reported, has the CCM MD in charge been notified? Yes.  .   Physician:  Illene Labrador, [place]ary Brim 01/14/2019 6:05 AM

## 2019-01-17 NOTE — Progress Notes (Signed)
OT Cancellation Note  Patient Details Name: Jessica Mayer MRN: [id number] DOB: November 13, 1960   Cancelled Treatment:    Reason Eval/Treat Not Completed: Patient not medically ready (Per RN, pt not medically ready. Will sign off. Please re-order when pt stable and ready.)  Lequita Halt, OT Student  01/09/2019, 9:16 AM

## 2019-01-17 NOTE — Sedation Documentation (Signed)
5 Fr. Exoseal to right groin 

## 2019-01-17 NOTE — Progress Notes (Signed)
Per verbal order of Dr. Kathyrn Sheriff, patients blood pressure allowed to settle in the 200's for [facility] therapy. Medication titrated as necessary. Please see eMAR for titration history. Fransico Michael RN BSN.

## 2019-01-17 NOTE — Progress Notes (Signed)
  NEUROSURGERY PROGRESS NOTE   Pt seen and examined. Remains intubated  EXAM: Temp:  [100.3 F (37.9 C)-102.9 F (39.4 C)] 101 F (38.3 C) (09/14 0838) Pulse Rate:  [105-144] 130 (09/14 0803) Resp:  [23-33] 28 (09/14 0803) BP: (124-189)/(73-107) 147/88 (09/14 0800) SpO2:  [88 %-100 %] 100 % (09/14 0803) Arterial Line BP: (165-222)/(74-115) 196/85 (09/14 0800) FiO2 (%):  [50 %-60 %] 60 % (09/14 0829) Intake/Output      09/13 0701 - 09/14 0700 09/14 0701 - 09/15 0700   I.V. (mL/kg) 4424 (51.7) 228.5 (2.7)   NG/GT 2325 45   IV Piggyback 260.2 40.1   Total Intake(mL/kg) 7009.2 (81.9) 313.6 (3.7)   Urine (mL/kg/hr) 5255 (2.6) 350 (2)   Stool 850    Total Output 6105 350   Net +904.2 -36.4         No eye opening to pain Pupils reactive w/d to pain RLE, flicker of w/d RUE No movement to pain LUE/LLE   LABS: Lab Results  Component Value Date   CREATININE 0.64 01/24/2019   BUN 11 01/25/2019   NA 153 (H) 01/04/2019   K 2.7 (LL) 01/07/2019   CL 117 (H) 01/06/2019   CO2 25 01/31/2019   Lab Results  Component Value Date   WBC 15.5 (H) 01/14/2019   HGB 8.5 (L) 01/14/2019   HCT 25.0 (L) 01/14/2019   MCV 84.3 01/14/2019   PLT 177 01/14/2019    IMAGING: CTA reviewed without new hemorrhage, increased R->L MLS. No HCP. Possible diffuse spasm although all major large vessels are patent. 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  IMPRESSION: - 58 y.o. female [place] d# 8 s/p RMCA clipping, postop SZ appears to be better controlled, Possible diffuse spasm  PLAN: - Cont hyperdynamic therapy, can let SBP up to 265mmHg - Cont AED's per neurology. LTM for now - Will plan on angiogram today, if spasm will attempt chemical angioplasty  I spoke with the patient's husband regarding her current condition and the plan of angiogram today. Risks, benefits, and alternatives were  discussed. All questions were answered and consent was obtained.

## 2019-01-18 ENCOUNTER — Encounter (HOSPITAL_COMMUNITY): Payer: Self-pay | Admitting: Neurosurgery

## 2019-01-18 ENCOUNTER — Other Ambulatory Visit (HOSPITAL_COMMUNITY): Payer: Self-pay

## 2019-01-18 DIAGNOSIS — I611 Nontraumatic intracerebral hemorrhage in hemisphere, cortical: Secondary | ICD-10-CM

## 2019-01-18 DIAGNOSIS — I609 Nontraumatic subarachnoid hemorrhage, unspecified: Secondary | ICD-10-CM

## 2019-01-18 LAB — BASIC METABOLIC PANEL
Anion gap: 12 (ref 5–15)
BUN: 9 mg/dL (ref 6–20)
CO2: 23 mmol/L (ref 22–32)
Calcium: 8.3 mg/dL — ABNORMAL LOW (ref 8.9–10.3)
Chloride: 114 mmol/L — ABNORMAL HIGH (ref 98–111)
Creatinine, Ser: 0.61 mg/dL (ref 0.44–1.00)
GFR calc Af Amer: >60 mL/min (ref >60)
GFR calc non Af Amer: >60 mL/min (ref >60)
Glucose, Bld: 122 mg/dL — ABNORMAL HIGH (ref 70–99)
Potassium: 3.2 mmol/L — ABNORMAL LOW (ref 3.5–5.1)
Sodium: 149 mmol/L — ABNORMAL HIGH (ref 135–145)

## 2019-01-18 LAB — CBC
HCT: 24 % — ABNORMAL LOW (ref 36.0–46.0)
Hemoglobin: 7.9 g/dL — ABNORMAL LOW (ref 12.0–15.0)
MCH: 27.3 pg (ref 26.0–34.0)
MCHC: 32.9 g/dL (ref 30.0–36.0)
MCV: 83 fL (ref 80.0–100.0)
Platelets: 150 10*3/uL (ref 150–400)
RBC: 2.89 MIL/uL — ABNORMAL LOW (ref 3.87–5.11)
RDW: 14.2 % (ref 11.5–15.5)
WBC: 21.5 10*3/uL — ABNORMAL HIGH (ref 4.0–10.5)
nRBC: 0.6 % — ABNORMAL HIGH (ref 0.0–0.2)

## 2019-01-18 LAB — PHENYTOIN LEVEL, TOTAL: Phenytoin Lvl: 7.2 ug/mL — ABNORMAL LOW (ref 10.0–20.0)

## 2019-01-18 LAB — MAGNESIUM: Magnesium: 1.8 mg/dL (ref 1.7–2.4)

## 2019-01-18 LAB — GLUCOSE, CAPILLARY
Glucose-Capillary: 107 mg/dL — ABNORMAL HIGH (ref 70–99)
Glucose-Capillary: 108 mg/dL — ABNORMAL HIGH (ref 70–99)
Glucose-Capillary: 122 mg/dL — ABNORMAL HIGH (ref 70–99)
Glucose-Capillary: 145 mg/dL — ABNORMAL HIGH (ref 70–99)
Glucose-Capillary: 151 mg/dL — ABNORMAL HIGH (ref 70–99)
Glucose-Capillary: 203 mg/dL — ABNORMAL HIGH (ref 70–99)

## 2019-01-18 LAB — C DIFFICILE QUICK SCREEN W PCR REFLEX
C Diff antigen: NEGATIVE
C Diff interpretation: NOT DETECTED
C Diff toxin: NEGATIVE

## 2019-01-18 LAB — ALBUMIN: Albumin: 2.1 g/dL — ABNORMAL LOW (ref 3.5–5.0)

## 2019-01-18 LAB — PHOSPHORUS: Phosphorus: 2 mg/dL — ABNORMAL LOW (ref 2.5–4.6)

## 2019-01-18 MED ORDER — POTASSIUM & SODIUM PHOSPHATES 280-160-250 MG PO PACK
2.0000 | PACK | Freq: Once | ORAL | Status: DC
  Filled 2019-01-18: qty 2

## 2019-01-18 MED ORDER — HEPARIN SODIUM (PORCINE) 5000 UNIT/ML IJ SOLN
5000.0000 [IU] | Freq: Three times a day (TID) | INTRAMUSCULAR | Status: DC
  Administered 2019-01-18 – 2019-01-26 (×25): 5000 [IU] via SUBCUTANEOUS
  Filled 2019-01-18 (×25): qty 1

## 2019-01-18 MED ORDER — POTASSIUM & SODIUM PHOSPHATES 280-160-250 MG PO PACK
2.0000 | PACK | Freq: Three times a day (TID) | ORAL | Status: DC
  Filled 2019-01-18: qty 2

## 2019-01-18 MED ORDER — PHENYTOIN SODIUM 50 MG/ML IJ SOLN
100.0000 mg | Freq: Two times a day (BID) | INTRAMUSCULAR | Status: DC
  Administered 2019-01-19 – 2019-01-21 (×6): 100 mg via INTRAVENOUS
  Filled 2019-01-18 (×6): qty 2

## 2019-01-18 MED ORDER — NIMODIPINE 30 MG PO CAPS: 30.0000 mg | ORAL_CAPSULE | ORAL | Status: DC

## 2019-01-18 MED ORDER — MAGNESIUM SULFATE 2 GM/50ML IV SOLN
2.0000 g | Freq: Once | INTRAVENOUS | Status: AC
  Administered 2019-01-18: 10:00:00 2 g via INTRAVENOUS
  Filled 2019-01-18: qty 50

## 2019-01-18 MED ORDER — POTASSIUM & SODIUM PHOSPHATES 280-160-250 MG PO PACK: 1.0000 | PACK | Freq: Three times a day (TID) | ORAL | Status: DC

## 2019-01-18 MED ORDER — FREE WATER
200.0000 mL | Freq: Three times a day (TID) | Status: DC
  Administered 2019-01-18 – 2019-01-24 (×19): 200 mL

## 2019-01-18 MED ORDER — NIMODIPINE 6 MG/ML PO SOLN
30.0000 mg | ORAL | Status: DC
  Administered 2019-01-18 – 2019-01-19 (×7): 30 mg
  Filled 2019-01-18 (×2): qty 10

## 2019-01-18 MED ORDER — POTASSIUM & SODIUM PHOSPHATES 280-160-250 MG PO PACK
2.0000 | PACK | Freq: Once | ORAL | Status: AC
  Administered 2019-01-18: 15:00:00 2
  Filled 2019-01-18: qty 2

## 2019-01-18 MED ORDER — SODIUM CHLORIDE 0.9 % IV SOLN
130.0000 mg | Freq: Every day | INTRAVENOUS | Status: DC
  Administered 2019-01-18 – 2019-01-20 (×3): 130 mg via INTRAVENOUS
  Filled 2019-01-18 (×4): qty 2.6

## 2019-01-18 MED ORDER — NIMODIPINE 60 MG/20ML PO SOLN
30.0000 mg | ORAL | Status: DC
  Administered 2019-01-18 (×3): 30 mg
  Filled 2019-01-18 (×9): qty 10

## 2019-01-18 MED ORDER — POTASSIUM CHLORIDE 20 MEQ PO PACK
20.0000 meq | PACK | Freq: Once | ORAL | Status: AC
  Administered 2019-01-18: 20 meq
  Filled 2019-01-18: qty 1

## 2019-01-18 MED ORDER — POTASSIUM CHLORIDE 20 MEQ PO PACK
40.0000 meq | PACK | Freq: Once | ORAL | Status: AC
  Administered 2019-01-18: 10:00:00 40 meq
  Filled 2019-01-18: qty 2

## 2019-01-18 NOTE — Progress Notes (Signed)
Assisted tele visit to patient with daughter, Lonzo Cloud.  Maryelizabeth Rowan, RN

## 2019-01-18 NOTE — Progress Notes (Addendum)
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 Hospital course c/b VAP, seizures.   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  AEDs initiated. 01/15/2019 -blood pressure augmentation for suspected vasospasm STUDIES:   01/17/2019 CT scans revealing a right MCA aneurysm and hematoma 01/13/2019:  ECHO: EF 65%, normal RV, mild MV Regurg 01/15/2019 CT scan shows stable residual hematoma but increased surrounding edema. CULTURES:  MRSA negative SARS-CoV-2 negative 01/13/2019: Blood Cx NGTD 01/04/2019: Respiratory Cx: GNR, GPR, GPC Clusters 01/18/19: C Diff.   ANTIBIOTICS:  Vanc 9/14 Zosyn 9/14  LINES/TUBES:  01/30/2019 endotracheal tube>> 01/05/2019  CONSULTANTS:  01/25/2019 pulmonary critical care SUBJECTIVE:  Remains off sedation Levo and Neo to maintain SBP > 170 Angio: Mild vasospasm involving the right M1 segment Improved oxygenation on ventilator  CONSTITUTIONAL: BP (!) 171/100   Pulse (!) 107   Temp (!) 100.5 F (38.1 C) (Axillary)   Resp (!) 31   Ht 5\' 5"  (1.651 m)   Wt 86.9 kg   LMP 05/29/2010   SpO2 100%   BMI 31.88 kg/m   I/O last 3 completed shifts: In:  10133.2 [I.V.:5794.4; NG/GT:2820; IV Piggyback:1518.8] Out: [id number]; Stool:950; Blood:50]     Vent Mode: PRVC FiO2 (%):  [40 %-60 %] 40 % Set Rate:  [18 bmp] 18 bmp Vt Set:  [450 mL] 450 mL PEEP:  [5 cmH20] 5 cmH20 Plateau Pressure:  [16 cmH20-19 cmH20] 16 cmH20  PHYSICAL EXAM: General: Female, intubated off sedation HEENT: R periorbital edema. PERRL, tongue edema   Neuro: PERRL, + cough/gag, spontaneous movement in RLE with minimal stimulation, L side flaccid.  CV: intermittent sinus tachycardia, warm and well perfused, no edema PULM: mechanical ventilation. Yellow secretions via ett GI: soft, NT, ND Extremities: Warm, no deformities  RESOLVED PROBLEM LIST   ASSESSMENT AND PLAN   Acute encephalopathy following subarachnoid hemorrhage and right MCA aneurysm clipping. Cerebral vasospasm, mild vasospasm M1 on Angio 9/14 P: -SBP: 180-200 -SAH protocol: Goal euvolemia, Nimotop, Seizure ppx, TCDs, glucose control.  -infectious process/fever may be contributing factor to ongoing encephalopathy as well.   Acute hypoxic respiratory failure VAP  P: -Abx: Vanc/Zosyn: D2. Awaiting finalization of respiratory culture.  -Continue full vent support with lung protective strategies, neurological exam and secretions exclude SBT/extubation  Status epilepticus -EEG -AED's per neuro: LEV, PHT.  May have ongoing subclinical seizures.   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     Best Practice / Goals of Care / Disposition.   DVT PROPHYLAXIS:SCD's. D/w NSGY today appropriate timing of DVT ppx >> per Dr Kathyrn Sheriff ok for DVT ppx  today.  SUP: PPI NUTRITION: N.p.o -tube feeds. MOBILITY: Bedrest GOALS OF CARE: Full code FAMILY DISCUSSIONS: Updated 9/14.  Attempted to contact today. DISPOSITION: ICU on mechanical ventilation  LABS  Glucose Recent Labs  Lab 01/16/2019 1336 01/21/2019 1519 01/10/2019 1945 01/10/2019 2338 01/18/19 0330 01/18/19 0811  GLUCAP 76 117* 165* 166* 108* 122*   Ct Angio Head W Or Wo Contrast  Result Date: 01/16/2019 CLINICAL DATA:  Follow-up intracranial hemorrhage. Right MCA aneurysm previously demonstrated. 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 Clipping of right MCA aneurysm. EXAM: CT ANGIOGRAPHY HEAD TECHNIQUE: Multidetector CT imaging of the head was performed using the standard protocol during bolus administration of intravenous contrast. Multiplanar CT image reconstructions and MIPs were obtained to evaluate the vascular anatomy. CONTRAST:  36mL OMNIPAQUE IOHEXOL 350 MG/ML SOLN COMPARISON:  01/15/2019 FINDINGS: CT HEAD Brain: No increased hemorrhage in the right hemisphere. Postoperative hematoma and air as seen previously. Infarction of the right temporal lobe and right frontal operculum as seen previously. No evidence of extension otherwise. Increased mass effect with right-to-left shift now measuring 9 mm. Small amount of subarachnoid blood and intraventricular blood appears the same. Vascular: See below Skull: Pterional craniotomy. Sinuses: Clear Orbits: Right periorbital soft tissue swelling. 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 Clipping of previously seen large right MCA aneurysm. Question 3 mm residual component as marked on the axial imaging. Question spasm of more distal branch vessels. On the left, the ICA is patent. Anterior and middle cerebral vessels are patent. There may also be a degree of spasm. Again, question 2 mm anterior communicating region aneurysm. Posterior circulation: Both vertebral arteries are patent to the basilar. Question spasm of the posterior circulation branch vessels. No evidence of  large or medium vessel occlusion. No posterior circulation aneurysm. Venous sinuses: Early opacification of the venous system. This could be delayed due to increased intracranial pressure. Anatomic variants: None other. IMPRESSION: No evidence of additional intracranial hemorrhage. Worsened intracranial swelling with right-to-left shift now measuring 9 mm. Probable infarctions in the right temporal lobe and right frontal operculum. Aneurysm clipping of large right MCA aneurysm with question of a 3 mm residual component. Suspicion of diffuse intracranial spasm. Delayed visualization of the venous sinuses possibly due to increased intracranial pressure. Electronically Signed   By: Nelson Chimes M.D.   On: 01/16/2019 15:08   Dg Chest Port 1 View  Result Date: 02/02/2019 CLINICAL DATA:  Hypoxia EXAM: PORTABLE CHEST 1 VIEW COMPARISON:  01/15/2019 FINDINGS: Support devices are stable. Low lung volumes. Minimal right base atelectasis. Heart is normal size. Left lung clear. No effusions or acute bony abnormality. IMPRESSION: Low lung volumes, right base atelectasis. Electronically Signed   By: Rolm Baptise M.D.   On: 01/25/2019 09:50   Vas [place] Transcranial Doppler  Result Date: 01/18/2019  Transcranial Doppler Indications: Subarachnoid hemorrhage. Limitations: bandages/ incisions Limitations for diagnostic windows: Unable to insonate right transtemporal window. Unable to insonate left transtemporal window. Comparison Study: 01/16/19 Performing Technologist: June Leap RDMS, RVT  Examination Guidelines: A complete evaluation includes B-mode imaging, spectral Doppler, color Doppler, and power Doppler as needed of all accessible portions of each vessel. Bilateral testing is considered an integral part of a complete examination. Limited examinations for reoccurring indications may be performed as noted.  +----------+-------------+----------+-----------+-------+ RIGHT TCD Right VM (cm)Depth (cm)PulsatilityComment  +----------+-------------+----------+-----------+-------+ Opthalmic     30.00  1.27            +----------+-------------+----------+-----------+-------+ ICA siphon    52.00                 1.24            +----------+-------------+----------+-----------+-------+  +----------+------------+----------+-----------+-------+ LEFT TCD  Left VM (cm)Depth (cm)PulsatilityComment +----------+------------+----------+-----------+-------+ Opthalmic    24.00                 1.59            +----------+------------+----------+-----------+-------+ ICA siphon   76.00                 1.04            +----------+------------+----------+-----------+-------+  +------------+-------+-------------+             VM cm/s   Comment    +------------+-------+-------------+ Prox Basilar       not insonated +------------+-------+-------------+ Dist Basilar       not insonated +------------+-------+-------------+ Summary:  Highly limited study due to absent bitemporal and occipital windows.Normal mean flow velocities and blood flow directions in both opthalmics andcarotid siphons. *See table(s) above for measurements and observations.  Diagnosing physician: Antony Contras MD Electronically signed by Antony Contras MD on 01/13/2019 at 3:39:43 PM.    Final    Vas [place] Transcranial Doppler  Result Date: 01/16/2019  Transcranial Doppler Indications: Subarachnoid hemorrhage. Limitations for diagnostic windows: Unable to insonate left transtemporal window. Comparison Study: pervious study done 01/14/19 Performing Technologist: Abram Sander RVS  Examination Guidelines: A complete evaluation includes B-mode imaging, spectral Doppler, color Doppler, and power Doppler as needed of all accessible portions of each vessel. Bilateral testing is considered an integral part of a complete examination. Limited examinations for reoccurring indications may be performed as noted.   +----------+-------------+----------+-----------+-------+ RIGHT TCD Right VM (cm)Depth (cm)PulsatilityComment +----------+-------------+----------+-----------+-------+ MCA           43.00                 1.01            +----------+-------------+----------+-----------+-------+ Term ICA      43.00                 1.23            +----------+-------------+----------+-----------+-------+ Opthalmic     25.00                 1.21            +----------+-------------+----------+-----------+-------+ ICA siphon    37.00                 1.04            +----------+-------------+----------+-----------+-------+  +----------+------------+----------+-----------+-------+ LEFT TCD  Left VM (cm)Depth (cm)PulsatilityComment +----------+------------+----------+-----------+-------+ Opthalmic    17.00                 1.57            +----------+------------+----------+-----------+-------+ ICA siphon   51.00                 1.22            +----------+------------+----------+-----------+-------+  Summary:  Absent left tempral and occipital windows limit evaluation. Normal mean flow velocities in right middle cerebral, bilateral opthalmics and carotid siphons.mildly elevated pulsatility indices suggest diffuse intracranial atherosclerosis or increased intracranial pressure *See table(s) above for measurements and observations.  Diagnosing physician: Antony Contras MD Electronically signed by Antony Contras MD on 01/16/2019 at  12:09:07 PM.    Final    Ir Angio Intra Extracran Sel Internal Carotid Bilat Mod Sed  Result Date: 01/18/2019 PROCEDURE: DIAGNOSTIC CEREBRAL ANGIOGRAM HISTORY: The patient is a 58 year old woman presenting to the hospital after being found down. 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 The patient underwent surgical clipping of the aneurysm with  partial evacuation of the hematoma. She was noted to have change in neurologic condition with decreased movement of the right side. CT angiogram suggested possibility of vasospasm. She therefore presents for diagnostic cerebral angiogram with possible angioplasty. ACCESS: The technical aspects of the procedure as well as its potential risks and benefits were reviewed with the patient's family. These risks included but were not limited bleeding, infection, allergic reaction, damage to organs or vital structures, stroke, non-diagnostic procedure, and the catastrophic outcomes of heart attack, coma, and death. With an understanding of these risks, informed consent was obtained and witnessed. The patient was placed in the supine position on the angiography table and the skin of right groin prepped in the usual sterile fashion. The procedure was performed under general anesthesia by the anesthesia service. A 5- French sheath was introduced in the right common femoral artery using Seldinger technique. A fluoro-phase sequence was used to document the sheath position. MEDICATIONS: HEPARIN: 2000 Units total. CONTRAST:  cc, Omnipaque 300 FLUOROSCOPY TIME:  FLUOROSCOPY TIME: See IR records TECHNIQUE: CATHETERS AND WIRES 5-French JB-1 catheter 0.035" glidewire VESSELS CATHETERIZED Right internal carotid Left internal carotid Left vertebral Right vertebral Right common femoral VESSELS STUDIED Right internal carotid, head Left internal carotid, head Left vertebral Right vertebral Right femoral PROCEDURAL NARRATIVE A 5-Fr JB-1 catheter was advanced over a 0.035 glidewire into the aortic arch. The above vessels were then sequentially catheterized and cervical / cerebral angiograms taken. After review of images, the catheter was removed without incident. FINDINGS: Right internal carotid: head: Injection reveals the presence of a widely patent ICA, M1, and A1 segments and their branches. Incidental note is made of a fetal type right  PCA. There is perhaps a minimal vasospasm involving the M1 segment, however no high-grade stenosis or flow limitation is seen. There does not appear to be any identifiable distal small vessel vasospasm. Surgical clip is seen in the region of the right middle cerebral artery bifurcation. 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 The inferiorly directed M2 segment has a loop just after its origin, while the aneurysm residual is 1-2 mm tall. This is actually better visualized on three-dimensional reconstructed CT angiogram done yesterday. The parenchymal and venous phases are normal. The venous sinuses are widely patent. Left internal carotid: head: Injection reveals the presence of a widely patent ICA, A1, and M1 segments and their branches. No aneurysms, AVMs, or high-flow fistulas are seen. There is no significant angiographic vasospasm seen. The parenchymal and venous phases are normal. The venous sinuses are widely patent. Right vertebral: Injection reveals the presence of a widely patent vertebral artery. This leads to a widely patent basilar artery that terminates in left P1. The basilar apex is normal. No significant vasospasm is seen involving the posterior circulation. The parenchymal and venous phases are normal. The venous sinuses are widely patent. Left vertebral: The vertebral artery is widely patent. No PICA aneurysm is seen. See basilar description above. Right femoral: Normal vessel. No significant atherosclerotic disease. Arterial  sheath in adequate position. DISPOSITION: Upon completion of the study, the femoral sheath was removed and hemostasis obtained using a 5-Fr ExoSeal closure device. Good proximal and distal lower extremity pulses were documented upon achievement of hemostasis. The procedure was well tolerated and no early complications were observed. The patient was transferred back to the ICU for further  care. IMPRESSION: 1. No significant intracranial vasospasm is identified, with perhaps mild vasospasm involving the right M1 segment. There is no high-grade stenosis or flow limitation. 2. 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 The preliminary results of this procedure were shared with the patient and the patient's family. Electronically Signed   By: Consuella Lose   On: 01/25/2019 13:17   Ir Angio Vertebral Sel Vertebral Bilat Mod Sed  Result Date: 01/05/2019 PROCEDURE: DIAGNOSTIC CEREBRAL ANGIOGRAM HISTORY: The patient is a 58 year old woman presenting to the hospital after being found down. 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 The patient underwent surgical clipping of the aneurysm with partial evacuation of the hematoma. She was noted to have change in neurologic condition with decreased movement of the right side. CT angiogram suggested possibility of vasospasm. She therefore presents for diagnostic cerebral angiogram with possible angioplasty. ACCESS: The technical aspects of the procedure as well as its potential risks and benefits were reviewed with the patient's family. These risks included but were not limited bleeding, infection, allergic reaction, damage to organs or vital structures, stroke, non-diagnostic procedure, and the catastrophic outcomes of heart attack, coma, and death. With an understanding of these risks, informed consent was obtained and witnessed. The patient was placed in the supine position on the angiography table and the skin of right groin prepped in the usual sterile fashion. The procedure was performed under general anesthesia by the anesthesia service. A 5- French sheath was introduced in the right common femoral artery using  Seldinger technique. A fluoro-phase sequence was used to document the sheath position. MEDICATIONS: HEPARIN: 2000 Units total. CONTRAST:  cc, Omnipaque 300 FLUOROSCOPY TIME:  FLUOROSCOPY TIME: See IR records TECHNIQUE: CATHETERS AND WIRES 5-French JB-1 catheter 0.035" glidewire VESSELS CATHETERIZED Right internal carotid Left internal carotid Left vertebral Right vertebral Right common femoral VESSELS STUDIED Right internal carotid, head Left internal carotid, head Left vertebral Right vertebral Right femoral PROCEDURAL NARRATIVE A 5-Fr JB-1 catheter was advanced over a 0.035 glidewire into the aortic arch. The above vessels were then sequentially catheterized and cervical / cerebral angiograms taken. After review of images, the catheter was removed without incident. FINDINGS: Right internal carotid: head: Injection reveals the presence of a widely patent ICA, M1, and A1 segments and their branches. Incidental note is made of a fetal type right PCA. There is perhaps a minimal vasospasm involving the M1 segment, however no high-grade stenosis or flow limitation is seen. There does not appear to be any identifiable distal small vessel vasospasm. Surgical clip is seen in the region of the right middle cerebral artery bifurcation. 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 The inferiorly directed M2 segment has a loop just after its origin, while the aneurysm residual is 1-2 mm tall. This is actually better visualized on three-dimensional reconstructed CT angiogram done yesterday. The parenchymal and venous phases are normal. The venous sinuses are widely patent. Left internal carotid:  head: Injection reveals the presence of a widely patent ICA, A1, and M1 segments and their branches. No aneurysms, AVMs, or high-flow fistulas are seen. There is no significant angiographic vasospasm seen. The parenchymal and venous phases are normal.  The venous sinuses are widely patent. Right vertebral: Injection reveals the presence of a widely patent vertebral artery. This leads to a widely patent basilar artery that terminates in left P1. The basilar apex is normal. No significant vasospasm is seen involving the posterior circulation. The parenchymal and venous phases are normal. The venous sinuses are widely patent. Left vertebral: The vertebral artery is widely patent. No PICA aneurysm is seen. See basilar description above. Right femoral: Normal vessel. No significant atherosclerotic disease. Arterial sheath in adequate position. DISPOSITION: Upon completion of the study, the femoral sheath was removed and hemostasis obtained using a 5-Fr ExoSeal closure device. Good proximal and distal lower extremity pulses were documented upon achievement of hemostasis. The procedure was well tolerated and no early complications were observed. The patient was transferred back to the ICU for further care. IMPRESSION: 1. No significant intracranial vasospasm is identified, with perhaps mild vasospasm involving the right M1 segment. There is no high-grade stenosis or flow limitation. 2. 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 The preliminary results of this procedure were shared with the patient and the patient's family. Electronically Signed   By: Consuella Lose   On: 01/08/2019 13:17    BMET Recent Labs  Lab 01/13/2019 0522 01/10/2019 1647 01/18/19 0347  NA 153* 150* 149*  K 2.7* 3.1* 3.2*  CL 117* 115* 114*  CO2 25 24 23   BUN 11 9 9   CREATININE 0.64 0.70 0.61  GLUCOSE 135* 137* 122*    Liver Enzymes Recent Labs  Lab 01/25/2019 1532 01/16/2019 0924 01/18/19 0347  AST 67* 25  --   ALT 26 29  --   ALKPHOS 65 70  --   BILITOT 0.7 0.6  --   ALBUMIN 4.1 2.2* 2.1*    Electrolytes Recent Labs  Lab 01/14/19 0449  01/15/19 0432   01/09/2019 0522 01/14/2019 1647 01/18/19 0347  CALCIUM 8.6*   < > 8.7*   < > 8.5* 8.0* 8.3*  MG 1.8  --  1.6*  --   --   --  1.8  PHOS 1.8*  --  2.7  --   --   --  2.0*   < > = values in this interval not displayed.    CBC Recent Labs  Lab 01/14/19 0449 01/14/19 1806 01/25/2019 0924 01/18/19 0347  WBC 15.5*  --  17.2* 21.5*  HGB 9.2* 8.5* 8.4* 7.9*  HCT 29.1* 25.0* 26.2* 24.0*  PLT 177  --  137* 150    ABG No results for input(s): PHART, PCO2ART, PO2ART in the last 168 hours.  Coag's Recent Labs  Lab 01/10/2019 1532  INR 1.1    Sepsis Markers Recent Labs  Lab 02/01/2019 1600 01/24/2019 2018  LATICACIDVEN 1.5 1.9    Cardiac Enzymes No results for input(s): TROPONINI, PROBNP in the last 168 hours.   CRITICAL CARE: 39 min Performed by: Isla Pence, ACNP [facility]  Pager: [phone]

## 2019-01-18 NOTE — Progress Notes (Signed)
  NEUROSURGERY PROGRESS NOTE   Pt seen and examined. No issues overnight.   EXAM: Temp:  [99.4 F (37.4 C)-101.2 F (38.4 C)] 100.5 F (38.1 C) (09/15 0813) Pulse Rate:  [73-142] 107 (09/15 0715) Resp:  [20-38] 31 (09/15 0715) BP: (121-202)/(71-119) 171/100 (09/15 0715) SpO2:  [94 %-100 %] 100 % (09/15 0715) Arterial Line BP: (128-219)/(68-107) 174/90 (09/15 0715) FiO2 (%):  [40 %-60 %] 40 % (09/15 0738) Weight:  [86.9 kg] 86.9 kg (09/15 0500) Intake/Output      09/14 0701 - 09/15 0700 09/15 0701 - 09/16 0700   I.V. (mL/kg) 3785.4 (43.6)    NG/GT 1880    IV Piggyback 1408.6    Total Intake(mL/kg) 7074 (81.4)    Urine (mL/kg/hr) 5125 (2.5)    Stool 100    Blood 50    Total Output 5275    Net +1799          No eye opening Pupils reactive OU Right periorbital edema is improving Wound c/d/i W/d RUE/RLE to pain No movement LUE/LLE to pain  LABS: Lab Results  Component Value Date   CREATININE 0.61 01/18/2019   BUN 9 01/18/2019   NA 149 (H) 01/18/2019   K 3.2 (L) 01/18/2019   CL 114 (H) 01/18/2019   CO2 23 01/18/2019   Lab Results  Component Value Date   WBC 21.5 (H) 01/18/2019   HGB 7.9 (L) 01/18/2019   HCT 24.0 (L) 01/18/2019   MCV 83.0 01/18/2019   PLT 150 01/18/2019    IMAGING: Angiogram does not demonstrate any significant spasm  IMPRESSION: - 58 y.o. female [place] d# 9 s/p RMCA clipping, remains minimally conscious with left hemiplegia, but stable - No significant large-vessel spasm, currently treating with hyperdynamic therapy, ?distal small-vessel spasm - Status appears controlled, no SZ yesterday. Overnight EEG read pending - Likely pneumonia on abx - Hypernatremia improving  PLAN: - Cont supportive care - SBP goal >178mmHg - Cont AEDs, may d/c LTM per neurology - vent/pna mgmt per PCCM - Cont free water q8

## 2019-01-18 NOTE — Progress Notes (Signed)
MEDICATION RELATED CONSULT NOTE - FOLLOW UP  Pharmacy Consult for Phenytoin Indication: seizures  Allergies  Allergen Reactions  . Asa Buff (Mag [Buffered Aspirin] Nausea And Vomiting    Patient Measurements: Height: 5\' 5"  (165.1 cm) Weight: 191 lb 9.3 oz (86.9 kg) IBW/kg (Calculated) : 57  Adjusted body weight: 68.6 kg  Vital Signs: Temp: 100.5 F (38.1 C) (09/15 0813) Temp Source: Axillary (09/15 0813) BP: 171/100 (09/15 0715) Pulse Rate: 107 (09/15 0715) Intake/Output from previous day: 09/14 0701 - 09/15 0700 In: 7074 [I.V.:3785.4; NG/GT:1880; IV Piggyback:1408.6] Out: 5275 [Urine:5125; Stool:100; Blood:50] Intake/Output from this shift: No intake/output data recorded.  Labs: Recent Labs    01/08/2019 0522 01/09/2019 0924 02/02/2019 1647 01/18/19 0347  WBC  --  17.2*  --  21.5*  HGB  --  8.4*  --  7.9*  HCT  --  26.2*  --  24.0*  PLT  --  137*  --  150  CREATININE 0.64  --  0.70 0.61  MG  --   --   --  1.8  PHOS  --   --   --  2.0*  ALBUMIN  --  2.2*  --  2.1*  PROT  --  6.2*  --   --   AST  --  25  --   --   ALT  --  29  --   --   ALKPHOS  --  70  --   --   BILITOT  --  0.6  --   --   BILIDIR  --  0.2  --   --   IBILI  --  0.4  --   --    Estimated Creatinine Clearance: 81.5 mL/min (by C-G formula based on SCr of 0.61 mg/dL).   Medications:  Scheduled:  . sodium chloride   Intravenous Once  . betaxolol  1 drop Both Eyes BID  . brinzolamide  1 drop Both Eyes TID  . Chlorhexidine Gluconate Cloth  6 each Topical Daily  . feeding supplement (PRO-STAT SUGAR FREE 64)  30 mL Per Tube BID  . fentaNYL (SUBLIMAZE) injection  50 mcg Intravenous Once  . free water  200 mL Per Tube Q8H  . heparin injection (subcutaneous)  5,000 Units Subcutaneous Q8H  . insulin aspart  0-15 Units Subcutaneous Q4H  . insulin detemir  15 Units Subcutaneous BID  . latanoprost  1 drop Both Eyes QHS  . mouth rinse  15 mL Mouth Rinse 10 times per day  . niMODipine  30 mg Oral Q2H   Or   . niMODipine  30 mg Per Tube Q2H  . pantoprazole (PROTONIX) IV  40 mg Intravenous QHS  . phenytoin (DILANTIN) IV  100 mg Intravenous Q8H  . potassium & sodium phosphates  2 packet Per Tube Once  . sennosides  5 mL Per Tube BID    Assessment: 58 YO female presenting with AMS found to have [place] s/p MCA aneurysm. EEG on 9/12 showed 10 seizures from R frontocentral region with new epileptogenicity in L frontal region. Patient was started on phenytoin 100 mg Q8h on 9/11. Pharmacy consulted to assist with maintenance dosing of phenytoin.  Corrected phenytoin trough level this morning is subtherapeutic at 13.8 mcg/ml. Albumin low at 2.1. Renal function remains stable and LFTs are WNL. Will increase phenytoin dose and re-check a level in 3-4 days.  Drug Interactions with Phenytoin:  Phenytoin is a strong CYP3A4 inducer and nimodipine is extensively metabolized by CYP3A4. This  interaction causes markedly reduced nimodipine levels.  - Patient is currently receiving nimodipine Q2h instead of Q4h. Will continue to monitor.  Goal of Therapy:  Phenytoin level 15-20 mcg/mL  Plan:  - Increase phenytoin to a total daily dose of 330 mg (100 mg at 0600 and 1400; 130 mg at 2200) - Check phenytoin trough and albumin in 3-4 days - Monitor renal function, LFTs, albumin - Monitor phenytoin-nimodipine DDI   Richardine Service, PharmD PGY1 Pharmacy Resident Phone: [phone] 01/18/2019  1:26 PM  Please check [email or web address] for unit-specific pharmacy phone numbers.

## 2019-01-18 NOTE — Anesthesia Postprocedure Evaluation (Addendum)
Anesthesia Post Note  Patient: Jessica Mayer  Procedure(s) Performed: IR WITH ANESTHESIA/ Embolization (N/A )     Patient location during evaluation: SICU Anesthesia Type: General Level of consciousness: sedated Pain management: pain level controlled Vital Signs Assessment: post-procedure vital signs reviewed and stable Respiratory status: patient remains intubated per anesthesia plan Cardiovascular status: stable Postop Assessment: no apparent nausea or vomiting Anesthetic complications: no    Last Vitals:  Vitals:   01/18/19 0715 01/18/19 0813  BP: (!) 171/100   Pulse: (!) 107   Resp: (!) 31   Temp:  (!) 38.1 C  SpO2: 100%     Last Pain:  Vitals:   01/18/19 0813  TempSrc: Axillary                 Philis Doke S

## 2019-01-18 NOTE — Progress Notes (Signed)
EEG maint complete. No skin breakdown. F3, F7, FP1 A1. Continue to monitor

## 2019-01-18 NOTE — Progress Notes (Signed)
Reason for consult: seizure  Subjective: NAEO. No seizures overnight. Opens eyes with noxious stimuli and able to move right side more today. NO concerns per RN.   ROS: Unable to obtain due to poor mental status  Examination  Vital signs in last 24 hours: Temp:  [100.2 F (37.9 C)-101.2 F (38.4 C)] 100.8 F (38.2 C) (09/15 1600) Pulse Rate:  [73-129] 88 (09/15 1445) Resp:  [19-38] 27 (09/15 1645) BP: (126-202)/(54-119) 177/88 (09/15 1645) SpO2:  [94 %-100 %] 100 % (09/15 1600) Arterial Line BP: (115-205)/(71-151) 162/116 (09/15 1645) FiO2 (%):  [40 %] 40 % (09/15 1600) Weight:  [86.9 kg] 86.9 kg (09/15 0500)  General: lying in bed, NAD, right eye edematous CVS: pulse-normal rate and rhythm RS: breathing comfortably Extremities: normal, warm  Neuro: MS: intubated, opens eyes to noxious stimuli, doesn't follow commands CN: pupils equal and reactive, oculocephalic intact, corneal reflex intact, gag +ve, rest CN unable to assess sec to intubation Motor and sensory: 2/5 in RUE/RLE, 1/5 in LUE/LLE  Basic Metabolic Panel: Recent Labs  Lab 01/13/19 0430  01/13/19 1508  01/14/19 0449  01/15/19 0432  01/16/19 0557 01/16/19 1831 01/22/2019 0522 02/01/2019 1647 01/18/19 0347  NA  --    < > 163*   < > 164*   < > 160*   < > 156* 154* 153* 150* 149*  K  --    < > 2.2*  --  2.5*   < > 3.8   < > 3.4* 2.9* 2.7* 3.1* 3.2*  CL  --    < > >130*  --  >130*   < > 124*   < > 117* 115* 117* 115* 114*  CO2  --    < > 21*  --  25   < > 26   < > 26 24 25 24 23   GLUCOSE  --    < > 197*  --  166*   < > 295*   < > 160* 164* 135* 137* 122*  BUN  --    < > 7  --  7   < > 8   < > 9 9 11 9 9   CREATININE  --    < > 0.70  --  0.60   < > 0.69   < > 0.65 0.71 0.64 0.70 0.61  CALCIUM  --    < > 8.1*  --  8.6*   < > 8.7*   < > 9.1 8.7* 8.5* 8.0* 8.3*  MG 1.9  --  1.9  --  1.8  --  1.6*  --   --   --   --   --  1.8  PHOS 1.6*  --  1.3*  --  1.8*  --  2.7  --   --   --   --   --  2.0*   < > = values in this  interval not displayed.    CBC: Recent Labs  Lab 01/14/19 0449 01/14/19 1806 01/08/2019 0924 01/18/19 0347  WBC 15.5*  --  17.2* 21.5*  NEUTROABS 12.2*  --   --   --   HGB 9.2* 8.5* 8.4* 7.9*  HCT 29.1* 25.0* 26.2* 24.0*  MCV 84.3  --  84.5 83.0  PLT 177  --  137* 150    Coagulation Studies: No results for input(s): LABPROT, INR in the last 72 hours.   Imaging CT head 01/15/2019:  1. Increased edema in the right temporal lobe and operculum  since 01/11/2019 along with leftward midline shift, now 6 mm (previously 4 mm). At the same time basilar cistern patency appears mildly improved. 2. Decreased pneumocephalus. Stable residual intracranial hemorrhage. No ventriculomegaly. 3. Stable right MCA region aneurysm clip  CTA head 01/16/2019: No evidence of additional intracranial hemorrhage. Worsened intracranial swelling with right-to-left shift now measuring 9 mm. Probable infarctions in the right temporal lobe and right frontal operculum.  Aneurysm clipping of large right MCA aneurysm with question of a 3 mm residual component.  Suspicion of diffuse intracranial spasm. Delayed visualization of the venous sinuses possibly due to increased intracranial pressure.  ASSESSMENT AND PLAN  58 year old woman with a spontaneous right MCA aneurysm rupture, ICH along with the subarachnoid hemorrhage from the aneurysm, cerebral edema, seizures with worsening mental status and worsening exam with right upper extremity weakness.     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 PHT level 10.2. - Of note, patient has severe structural underlying abnormality which led to status. Therefore, our goal is to  minimize seizures without causing excessive sedation and other side effects. If patient has seizures, we will optimize current AEDs with that goal in mind and not try to treat every subclinical seizure.  - seizure precautions  - PRN IV ativan only for GTC>2 mins    CRITICAL CARE Performed by: Lora Havens  Total critical care time: 35 minutes  Critical care time was exclusive of separately billable procedures and treating other patients.  Critical care was necessary to treat or prevent imminent or life-threatening deterioration.  Critical care was time spent personally by me on the following activities: development of treatment plan with patient and/or surrogate as well as nursing, discussions with consultants, evaluation of patient's response to treatment, examination of patient, obtaining history from patient or surrogate, ordering and performing treatments and interventions, ordering and review of laboratory studies, ordering and review of radiographic studies, pulse oximetry and re-evaluation of patient's condition.

## 2019-01-18 NOTE — Procedures (Signed)
Jessica Jessica Mayer Epilepsy Attending:Revecca Nachtigal Jessica Mayer Referring Physician/Provider:Dr Kipp Brood Duration:01/08/2019 1321 to 01/18/2019 1321  Jessica Mayer with ams and [place] s/p MCA aneurysm clipping. EEG to evaluate for seizures  Level of alertness:comatose  AEDs during EEG study:LEV, PHT  Technical aspects: This EEG study was done with scalp electrodes positioned according to the 10-20 International system of electrode placement. Electrical activity was acquired at a sampling rate of 500Hz  and reviewed with a high frequency filter of 70Hz  and a low frequency filter of 1Hz . EEG data were recorded continuously and digitally stored.  DESCRIPTION: EEG showed continuous generalized 2-5hz  theta-delta slowing, maximal in right hemisphere. There were also sharp waves in left frontal region which at times appeared rhythmic with no clear evolution to suggest seizures.Hyperventilation and photic stimulation were not performed.   IMPRESSION: This studyshowedpotential epileptogenicity in left frontal region. No definite seizures were seen. There was also cortical dysfunction in right hemisphere consistent with prior craniotomy as well assevere diffuse encephalopathy.  EEG appears significantly improved compared to previous study.  02/02/2019 [place]

## 2019-01-19 ENCOUNTER — Inpatient Hospital Stay (HOSPITAL_COMMUNITY): Payer: Medicaid Other

## 2019-01-19 DIAGNOSIS — I67848 Other cerebrovascular vasospasm and vasoconstriction: Secondary | ICD-10-CM

## 2019-01-19 DIAGNOSIS — I609 Nontraumatic subarachnoid hemorrhage, unspecified: Secondary | ICD-10-CM

## 2019-01-19 DIAGNOSIS — J9690 Respiratory failure, unspecified, unspecified whether with hypoxia or hypercapnia: Secondary | ICD-10-CM

## 2019-01-19 LAB — BASIC METABOLIC PANEL
Anion gap: 14 (ref 5–15)
BUN: 7 mg/dL (ref 6–20)
CO2: 22 mmol/L (ref 22–32)
Calcium: 8.1 mg/dL — ABNORMAL LOW (ref 8.9–10.3)
Chloride: 112 mmol/L — ABNORMAL HIGH (ref 98–111)
Creatinine, Ser: 0.55 mg/dL (ref 0.44–1.00)
GFR calc Af Amer: >60 mL/min (ref >60)
GFR calc non Af Amer: >60 mL/min (ref >60)
Glucose, Bld: 145 mg/dL — ABNORMAL HIGH (ref 70–99)
Potassium: 3.3 mmol/L — ABNORMAL LOW (ref 3.5–5.1)
Sodium: 148 mmol/L — ABNORMAL HIGH (ref 135–145)

## 2019-01-19 LAB — GLUCOSE, CAPILLARY
Glucose-Capillary: 118 mg/dL — ABNORMAL HIGH (ref 70–99)
Glucose-Capillary: 137 mg/dL — ABNORMAL HIGH (ref 70–99)
Glucose-Capillary: 148 mg/dL — ABNORMAL HIGH (ref 70–99)
Glucose-Capillary: 150 mg/dL — ABNORMAL HIGH (ref 70–99)
Glucose-Capillary: 162 mg/dL — ABNORMAL HIGH (ref 70–99)
Glucose-Capillary: 162 mg/dL — ABNORMAL HIGH (ref 70–99)

## 2019-01-19 LAB — CBC
HCT: 25.1 % — ABNORMAL LOW (ref 36.0–46.0)
Hemoglobin: 8.3 g/dL — ABNORMAL LOW (ref 12.0–15.0)
MCH: 27.5 pg (ref 26.0–34.0)
MCHC: 33.1 g/dL (ref 30.0–36.0)
MCV: 83.1 fL (ref 80.0–100.0)
Platelets: 202 10*3/uL (ref 150–400)
RBC: 3.02 MIL/uL — ABNORMAL LOW (ref 3.87–5.11)
RDW: 14.5 % (ref 11.5–15.5)
WBC: 21.4 10*3/uL — ABNORMAL HIGH (ref 4.0–10.5)
nRBC: 1.3 % — ABNORMAL HIGH (ref 0.0–0.2)

## 2019-01-19 LAB — HEMOGLOBIN A1C
Hgb A1c MFr Bld: 5.4 % (ref 4.8–5.6)
Mean Plasma Glucose: 108 mg/dL

## 2019-01-19 LAB — CULTURE, RESPIRATORY W GRAM STAIN

## 2019-01-19 LAB — PHOSPHORUS: Phosphorus: 2.5 mg/dL (ref 2.5–4.6)

## 2019-01-19 LAB — MAGNESIUM: Magnesium: 2.2 mg/dL (ref 1.7–2.4)

## 2019-01-19 MED ORDER — NIMODIPINE 6 MG/ML PO SOLN
30.0000 mg | ORAL | Status: AC
  Administered 2019-01-19 – 2019-01-21 (×29): 30 mg
  Filled 2019-01-19 (×16): qty 10

## 2019-01-19 MED ORDER — ACETAMINOPHEN 160 MG/5ML PO SOLN
650.0000 mg | Freq: Four times a day (QID) | ORAL | Status: DC
  Administered 2019-01-19 – 2019-01-26 (×28): 650 mg
  Filled 2019-01-19 (×27): qty 20.3

## 2019-01-19 MED ORDER — SODIUM CHLORIDE 0.9 % IV SOLN
2.0000 g | INTRAVENOUS | Status: DC
  Administered 2019-01-19 – 2019-01-23 (×5): 2 g via INTRAVENOUS
  Filled 2019-01-19 (×5): qty 2
  Filled 2019-01-19: qty 20

## 2019-01-19 MED ORDER — ACETAMINOPHEN 650 MG RE SUPP: 650.0000 mg | Freq: Four times a day (QID) | RECTAL | Status: DC

## 2019-01-19 MED ORDER — POTASSIUM CHLORIDE 20 MEQ PO PACK
40.0000 meq | PACK | ORAL | Status: AC
  Administered 2019-01-19 (×2): 40 meq
  Filled 2019-01-19 (×2): qty 2

## 2019-01-19 MED ORDER — ACETAMINOPHEN 325 MG PO TABS: 650.0000 mg | ORAL_TABLET | Freq: Four times a day (QID) | ORAL | Status: DC

## 2019-01-19 MED ORDER — INFLUENZA VAC SPLIT QUAD 0.5 ML IM SUSY
0.5000 mL | PREFILLED_SYRINGE | INTRAMUSCULAR | Status: DC
  Filled 2019-01-19 (×2): qty 0.5

## 2019-01-19 NOTE — Progress Notes (Signed)
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 Hospital course c/b VAP, seizures, vasospasm  SIGNIFICANT PAST MEDICAL HISTORY   Hypertension with noncompliance of medications Glaucoma on 3 eyedrops Anxiety  SIGNIFICANT EVENTS:  01/25/2019 - right frontal temporal craniotomy with clipping of right middle cerebral aneurysm and evacuation of right temporal hematoma 9/8-9/9 Received Hypertonic saline  01/14/2019 - started on LTM for nonconvulsive focal status epilepticus.  AEDs initiated. 9/11-9/13 Received DDAVP 01/15/2019 -blood pressure augmentation for suspected vasospasm 9/13 DDAVP stopped  STUDIES:   02/01/2019 CT scans revealing a right MCA aneurysm and hematoma 01/13/2019:  ECHO: EF 65%, normal RV, mild MV Regurg 01/15/2019 CT scan shows stable residual hematoma but increased surrounding edema. 9/13 CT head: no additional ICH, suspicion of diffuse intracranial spasm 9/14 Diagnostic Cerebral Angiogram: No significant intracranial vasospasm, perhaps mild vasospasm involving the R M1 segment 9/14 TCD: limited study, normal flow     CULTURES:  MRSA>>negative SARS-CoV-2>> negative 01/21/2019: Blood Cx>> 01/20/2019: Respiratory Cx>> Staph Aureus and Enterobacter>>Staph pan-sensitive, Enterobacter resistant to cefazolin 01/18/19: C Diff>>neg  ANTIBIOTICS:  Vanc 9/14>>9/16 Zosyn 9/14>>9/16 Ceftriaxone 9/16>>   LINES/TUBES:  01/06/2019 endotracheal tube>> 01/23/2019;  9/11>> 9/11 CVC L subclavian>> 9/13 Arterial Line>>  CONSULTANTS:  pulmonary critical care Neurology  SUBJECTIVE:  Remains intubated and on pressors to keep SBP at goal  CONSTITUTIONAL: BP (!) 181/102   Pulse (!) 111   Temp (!) 100.8 F (38.2 C) (Axillary)   Resp (!) 36   Ht 5\' 5"  (1.651 m)   Wt 86.9 kg   LMP 05/29/2010   SpO2 99%   BMI 31.88 kg/m   I/O last 3 completed shifts: In: 9891.7 [I.V.:6168.9; NG/GT:2620; IV Piggyback:1102.8] Out: 9500 [Urine:9200; Stool:300]     Vent Mode: PRVC FiO2 (%):  [30 %-40 %] 30 % Set Rate:  [18 bmp] 18 bmp Vt Set:  [450 mL] 450 mL PEEP:  [5 cmH20] 5 cmH20 Plateau Pressure:  [15 [id number] cmH20] 17 cmH20    General: intubated and minimally responsive of sedation HEENT: MM pink/moist Neuro: minimally responsive to pain on the R side, pupils responsive  CV: s1s2 rrr, no m/r/g PULM:  Course breath sounds throughout with purulent secretions, intubated on full vent support GI: soft, bsx4 active  Extremities: warm/dry, trace edema  Skin: no rashes or lesions   RESOLVED PROBLEM LIST   ASSESSMENT AND PLAN   Subarachnoid Hemorrhage and R MCA aneurysm clipping with mild M1 vasospasm P: -Continue pressors to achieve SBP goal 180-200 -Goal euvolemia -Continue Nimodipine  -TCD's M/W/F   Status Epilepticus -No seizures for the last 24hrs and continuous EEG discontinued per neurology P: -Continue seizure precautions and Dilantin, Keppra    Encephalopathy  -Encephalopathy likely secondary to the above -Only responsive to pain off sedation P: -Continue to monitor for neurologic improvement    Acute Hypoxic Respiratory failure, VAP -likely secondary to neurologic injury, complicated by VAP -cultures growing staph and enterobacter, both susceptible to Ceftriaxone P: -D/c Vanc and Zosyn, initiate Ceftriaxone -Continue full Vent support, VAP bundle -  SBT as able    Iatrogenic Hypernatremia -improving -Received hypertonic  saline from 9-8-9/9 and subsequently developed DI -DDAVP stopped 9/13 P: -Continue to monitor BMET -Cont. FWF q8hrs   Hypokalemia -K 3.3 today P: IV K [id number]   Hyperglycemia due to stress -SSI & Basal insulin      Best Practice / Goals of Care / Disposition.   DVT PROPHYLAXIS:SCD's. D/w NSGY today appropriate timing of DVT ppx >> per Dr Kathyrn Sheriff ok for DVT ppx today.  SUP: PPI NUTRITION: N.p.o -tube feeds. MOBILITY: Bedrest GOALS OF CARE: Full code FAMILY DISCUSSIONS: Updated 9/14. Attempted to contact today. DISPOSITION: ICU on mechanical ventilation  LABS  Glucose Recent Labs  Lab 01/18/19 0811 01/18/19 1213 01/18/19 1643 01/18/19 1936 01/18/19 2347 01/19/19 0338  GLUCAP 122* 151* 107* 145* 203* 150*   Dg Chest Port 1 View  Result Date: 01/19/2019 CLINICAL DATA:  Encounter for central line care EXAM: PORTABLE CHEST 1 VIEW COMPARISON:  Two days ago FINDINGS: Endotracheal tube tip just below the clavicular heads. The feeding tube at least reaches the distal stomach. Left subclavian line with tip at the SVC. Artifact from EKG leads. Indistinct opacification in the lower lungs. No edema, effusion, or pneumothorax. Normal heart size. IMPRESSION: 1. Unremarkable hardware positioning. 2. Stable atelectasis or infiltrate in the lower lungs. Electronically Signed   By: Monte Fantasia M.D.   On: 01/19/2019 05:51   Dg Chest Port 1 View  Result Date: 01/12/2019 CLINICAL DATA:  Hypoxia EXAM: PORTABLE CHEST 1 VIEW COMPARISON:  01/15/2019 FINDINGS: Support devices are stable. Low lung volumes. Minimal right base atelectasis. Heart is normal size. Left lung clear. No effusions or acute bony abnormality. IMPRESSION: Low lung volumes, right base atelectasis. Electronically Signed   By: Rolm Baptise M.D.   On: 01/07/2019 09:50   Vas [place] Transcranial Doppler  Result Date: 01/12/2019  Transcranial Doppler Indications: Subarachnoid hemorrhage. Limitations: bandages/ incisions  Limitations for diagnostic windows: Unable to insonate right transtemporal window. Unable to insonate left transtemporal window. Comparison Study: 01/16/19 Performing Technologist: June Leap RDMS, RVT  Examination Guidelines: A complete evaluation includes B-mode imaging, spectral Doppler, color Doppler, and power Doppler as needed of all accessible portions of each vessel. Bilateral testing is considered an integral part of a complete examination. Limited examinations for reoccurring indications may be performed as noted.  +----------+-------------+----------+-----------+-------+ RIGHT TCD Right VM (cm)Depth (cm)PulsatilityComment +----------+-------------+----------+-----------+-------+ Opthalmic     30.00                 1.27            +----------+-------------+----------+-----------+-------+ ICA siphon    52.00                 1.24            +----------+-------------+----------+-----------+-------+  +----------+------------+----------+-----------+-------+ LEFT TCD  Left VM (cm)Depth (cm)PulsatilityComment +----------+------------+----------+-----------+-------+ Opthalmic    24.00                 1.59            +----------+------------+----------+-----------+-------+ ICA siphon   76.00                 1.04            +----------+------------+----------+-----------+-------+  +------------+-------+-------------+             VM cm/s   Comment    +------------+-------+-------------+ Prox Basilar       not insonated +------------+-------+-------------+ Dist Basilar       not  insonated +------------+-------+-------------+ Summary:  Highly limited study due to absent bitemporal and occipital windows.Normal mean flow velocities and blood flow directions in both opthalmics andcarotid siphons. *See table(s) above for measurements and observations.  Diagnosing physician: Antony Contras MD Electronically signed by Antony Contras MD on 01/25/2019 at 3:39:43 PM.    Final    Ir  Angio Intra Extracran Sel Internal Carotid Bilat Mod Sed  Result Date: 01/13/2019 PROCEDURE: DIAGNOSTIC CEREBRAL ANGIOGRAM HISTORY: The patient is a 58 year old woman presenting to the hospital after being found down. 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 The patient underwent surgical clipping of the aneurysm with partial evacuation of the hematoma. She was noted to have change in neurologic condition with decreased movement of the right side. CT angiogram suggested possibility of vasospasm. She therefore presents for diagnostic cerebral angiogram with possible angioplasty. ACCESS: The technical aspects of the procedure as well as its potential risks and benefits were reviewed with the patient's family. These risks included but were not limited bleeding, infection, allergic reaction, damage to organs or vital structures, stroke, non-diagnostic procedure, and the catastrophic outcomes of heart attack, coma, and death. With an understanding of these risks, informed consent was obtained and witnessed. The patient was placed in the supine position on the angiography table and the skin of right groin prepped in the usual sterile fashion. The procedure was performed under general anesthesia by the anesthesia service. A 5- French sheath was introduced in the right common femoral artery using Seldinger technique. A fluoro-phase sequence was used to document the sheath position. MEDICATIONS: HEPARIN: 2000 Units total. CONTRAST:  cc, Omnipaque 300 FLUOROSCOPY TIME:  FLUOROSCOPY TIME: See IR records TECHNIQUE: CATHETERS AND WIRES 5-French JB-1 catheter 0.035" glidewire VESSELS CATHETERIZED Right internal carotid Left internal carotid Left vertebral Right vertebral Right common femoral VESSELS STUDIED Right internal carotid, head Left internal carotid, head Left vertebral Right vertebral Right  femoral PROCEDURAL NARRATIVE A 5-Fr JB-1 catheter was advanced over a 0.035 glidewire into the aortic arch. The above vessels were then sequentially catheterized and cervical / cerebral angiograms taken. After review of images, the catheter was removed without incident. FINDINGS: Right internal carotid: head: Injection reveals the presence of a widely patent ICA, M1, and A1 segments and their branches. Incidental note is made of a fetal type right PCA. There is perhaps a minimal vasospasm involving the M1 segment, however no high-grade stenosis or flow limitation is seen. There does not appear to be any identifiable distal small vessel vasospasm. Surgical clip is seen in the region of the right middle cerebral artery bifurcation. 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 The inferiorly directed M2 segment has a loop just after its origin, while the aneurysm residual is 1-2 mm tall. This is actually better visualized on three-dimensional reconstructed CT angiogram done yesterday. The parenchymal and venous phases are normal. The venous sinuses are widely patent. Left internal carotid: head: Injection reveals the presence of a widely patent ICA, A1, and M1 segments and their branches. No aneurysms, AVMs, or high-flow fistulas are seen. There is no significant angiographic vasospasm seen. The parenchymal and venous phases are normal. The venous sinuses are widely patent. Right vertebral: Injection reveals the presence of a widely patent vertebral artery. This leads to a widely patent basilar artery that terminates in left P1. The basilar apex is normal.  No significant vasospasm is seen involving the posterior circulation. The parenchymal and venous phases are normal. The venous sinuses are widely patent. Left vertebral: The vertebral artery is widely patent. No PICA aneurysm is seen. See basilar description above. Right femoral:  Normal vessel. No significant atherosclerotic disease. Arterial sheath in adequate position. DISPOSITION: Upon completion of the study, the femoral sheath was removed and hemostasis obtained using a 5-Fr ExoSeal closure device. Good proximal and distal lower extremity pulses were documented upon achievement of hemostasis. The procedure was well tolerated and no early complications were observed. The patient was transferred back to the ICU for further care. IMPRESSION: 1. No significant intracranial vasospasm is identified, with perhaps mild vasospasm involving the right M1 segment. There is no high-grade stenosis or flow limitation. 2. 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 The preliminary results of this procedure were shared with the patient and the patient's family. Electronically Signed   By: Consuella Lose   On: 02/02/2019 13:17   Ir Angio Vertebral Sel Vertebral Bilat Mod Sed  Result Date: 01/08/2019 PROCEDURE: DIAGNOSTIC CEREBRAL ANGIOGRAM HISTORY: The patient is a 58 year old woman presenting to the hospital after being found down. 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 The patient underwent surgical clipping of the aneurysm with partial evacuation of the hematoma. She was noted to have change in neurologic condition with decreased movement of the right side. CT angiogram suggested possibility of vasospasm. She therefore presents for diagnostic cerebral angiogram with possible angioplasty. ACCESS: The technical aspects of the procedure as well as its potential risks and benefits were reviewed with the patient's family. These risks included but were not limited bleeding, infection, allergic reaction, damage to organs or vital structures, stroke, non-diagnostic  procedure, and the catastrophic outcomes of heart attack, coma, and death. With an understanding of these risks, informed consent was obtained and witnessed. The patient was placed in the supine position on the angiography table and the skin of right groin prepped in the usual sterile fashion. The procedure was performed under general anesthesia by the anesthesia service. A 5- French sheath was introduced in the right common femoral artery using Seldinger technique. A fluoro-phase sequence was used to document the sheath position. MEDICATIONS: HEPARIN: 2000 Units total. CONTRAST:  cc, Omnipaque 300 FLUOROSCOPY TIME:  FLUOROSCOPY TIME: See IR records TECHNIQUE: CATHETERS AND WIRES 5-French JB-1 catheter 0.035" glidewire VESSELS CATHETERIZED Right internal carotid Left internal carotid Left vertebral Right vertebral Right common femoral VESSELS STUDIED Right internal carotid, head Left internal carotid, head Left vertebral Right vertebral Right femoral PROCEDURAL NARRATIVE A 5-Fr JB-1 catheter was advanced over a 0.035 glidewire into the aortic arch. The above vessels were then sequentially catheterized and cervical / cerebral angiograms taken. After review of images, the catheter was removed without incident. FINDINGS: Right internal carotid: head: Injection reveals the presence of a widely patent ICA, M1, and A1 segments and their branches. Incidental note is made of a fetal type right PCA. There is perhaps a minimal vasospasm involving the M1 segment, however no high-grade stenosis or flow limitation is seen. There does not appear to be any identifiable distal small vessel vasospasm. Surgical clip is seen in the region of the right middle cerebral artery bifurcation. 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  The inferiorly directed M2 segment has a loop just after its origin, while the aneurysm residual is 1-2 mm tall. This is  actually better visualized on three-dimensional reconstructed CT angiogram done yesterday. The parenchymal and venous phases are normal. The venous sinuses are widely patent. Left internal carotid: head: Injection reveals the presence of a widely patent ICA, A1, and M1 segments and their branches. No aneurysms, AVMs, or high-flow fistulas are seen. There is no significant angiographic vasospasm seen. The parenchymal and venous phases are normal. The venous sinuses are widely patent. Right vertebral: Injection reveals the presence of a widely patent vertebral artery. This leads to a widely patent basilar artery that terminates in left P1. The basilar apex is normal. No significant vasospasm is seen involving the posterior circulation. The parenchymal and venous phases are normal. The venous sinuses are widely patent. Left vertebral: The vertebral artery is widely patent. No PICA aneurysm is seen. See basilar description above. Right femoral: Normal vessel. No significant atherosclerotic disease. Arterial sheath in adequate position. DISPOSITION: Upon completion of the study, the femoral sheath was removed and hemostasis obtained using a 5-Fr ExoSeal closure device. Good proximal and distal lower extremity pulses were documented upon achievement of hemostasis. The procedure was well tolerated and no early complications were observed. The patient was transferred back to the ICU for further care. IMPRESSION: 1. No significant intracranial vasospasm is identified, with perhaps mild vasospasm involving the right M1 segment. There is no high-grade stenosis or flow limitation. 2. 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 The preliminary results of this procedure were shared with the patient and the patient's family. Electronically Signed   By: Consuella Lose   On: 01/04/2019 13:17    BMET Recent Labs   Lab 01/18/2019 1647 01/18/19 0347 01/19/19 0401  NA 150* 149* 148*  K 3.1* 3.2* 3.3*  CL 115* 114* 112*  CO2 24 23 22   BUN 9 9 7   CREATININE 0.70 0.61 0.55  GLUCOSE 137* 122* 145*    Liver Enzymes Recent Labs  Lab 01/13/2019 0924 01/18/19 0347  AST 25  --   ALT 29  --   ALKPHOS 70  --   BILITOT 0.6  --   ALBUMIN 2.2* 2.1*    Electrolytes Recent Labs  Lab 01/15/19 0432  01/07/2019 1647 01/18/19 0347 01/19/19 0401  CALCIUM 8.7*   < > 8.0* 8.3* 8.1*  MG 1.6*  --   --  1.8 2.2  PHOS 2.7  --   --  2.0* 2.5   < > = values in this interval not displayed.    CBC Recent Labs  Lab 01/15/2019 0924 01/18/19 0347 01/19/19 0401  WBC 17.2* 21.5* 21.4*  HGB 8.4* 7.9* 8.3*  HCT 26.2* 24.0* 25.1*  PLT 137* 150 202    ABG No results for input(s): PHART, PCO2ART, PO2ART in the last 168 hours.  Coag's No results for input(s): APTT, INR in the last 168 hours.  Sepsis Markers No results for input(s): LATICACIDVEN, PROCALCITON, O2SATVEN in the last 168 hours.  Cardiac Enzymes No results for input(s): TROPONINI, PROBNP in the last 168 hours.   CRITICAL CARE Performed by: Otilio Carpen Macyn Remmert   Total critical care time: 45 minutes  Critical care time was exclusive of separately billable procedures and treating other patients.  Critical care was necessary to treat or prevent imminent or life-threatening deterioration.  Critical care was time spent personally by me  on the following activities: development of treatment plan with patient and/or surrogate as well as nursing, discussions with consultants, evaluation of patient's response to treatment, examination of patient, obtaining history from patient or surrogate, ordering and performing treatments and interventions, ordering and review of laboratory studies, ordering and review of radiographic studies, pulse oximetry and re-evaluation of patient's condition.

## 2019-01-19 NOTE — Progress Notes (Signed)
Reason for consult: seizure  Subjective: NAEO. No new concerns.   ROS: Unable to obtain due to poor mental status  Examination  Vital signs in last 24 hours: Temp:  [100.2 F (37.9 C)-102.1 F (38.9 C)] 102.1 F (38.9 C) (09/16 0800) Pulse Rate:  [70-122] 113 (09/16 0815) Resp:  [19-37] 28 (09/16 0815) BP: (126-202)/(54-160) 151/77 (09/16 0815) SpO2:  [96 %-100 %] 97 % (09/16 0815) Arterial Line BP: (115-198)/(71-189) 194/189 (09/15 1900) FiO2 (%):  [30 %-40 %] 30 % (09/16 0809)  General: lying in bed, NAD, right eye edematous CVS: pulse-normal rate and rhythm RS: breathing comfortably Extremities: normal, warm  Neuro: [id number], opens eyes to noxious stimuli, doesn't follow commands CN: pupils equal and reactive,oculocephalic intact, corneal reflex intact, gag +ve, rest CN unable to assess sec to intubation Motorand sensory: 2/5 in RUE/RLE, 1/5 in LUE/LLE  Basic Metabolic Panel: Recent Labs  Lab 01/13/19 1508  01/14/19 0449  01/15/19 0432  01/16/19 1831 01/25/2019 0522 01/19/2019 1647 01/18/19 0347 01/19/19 0401  NA 163*   < > 164*   < > 160*   < > 154* 153* 150* 149* 148*  K 2.2*  --  2.5*   < > 3.8   < > 2.9* 2.7* 3.1* 3.2* 3.3*  CL >130*  --  >130*   < > 124*   < > 115* 117* 115* 114* 112*  CO2 21*  --  25   < > 26   < > 24 25 24 23 22   GLUCOSE 197*  --  166*   < > 295*   < > 164* 135* 137* 122* 145*  BUN 7  --  7   < > 8   < > 9 11 9 9 7   CREATININE 0.70  --  0.60   < > 0.69   < > 0.71 0.64 0.70 0.61 0.55  CALCIUM 8.1*  --  8.6*   < > 8.7*   < > 8.7* 8.5* 8.0* 8.3* 8.1*  MG 1.9  --  1.8  --  1.6*  --   --   --   --  1.8 2.2  PHOS 1.3*  --  1.8*  --  2.7  --   --   --   --  2.0* 2.5   < > = values in this interval not displayed.    CBC: Recent Labs  Lab 01/14/19 0449 01/14/19 1806 01/13/2019 0924 01/18/19 0347 01/19/19 0401  WBC 15.5*  --  17.2* 21.5* 21.4*  NEUTROABS 12.2*  --   --   --   --   HGB 9.2* 8.5* 8.4* 7.9* 8.3*  HCT 29.1* 25.0* 26.2*  24.0* 25.1*  MCV 84.3  --  84.5 83.0 83.1  PLT 177  --  137* 150 202     Coagulation Studies: No results for input(s): LABPROT, INR in the last 72 hours.  Imaging CT head 01/15/2019: 1. Increased edema in the right temporal lobe and operculum since 01/29/2019 along with leftward midline shift, now 6 mm (previously 4 mm). At the same time basilar cistern patency appears mildly improved. 2. Decreased pneumocephalus. Stable residual intracranial hemorrhage. No ventriculomegaly. 3. Stable right MCA region aneurysm clip  CTA head 01/16/2019: No evidence of additional intracranial hemorrhage. Worsened intracranial swelling with right-to-left shift now measuring 9 mm. Probable infarctions in the right temporal lobe and right frontal operculum.  Aneurysm clipping of large right MCA aneurysm with question of a 3 mm residual component.  Suspicion of  diffuse intracranial spasm. Delayed visualization of the venous sinuses possibly due to increased intracranial pressure.  ASSESSMENT AND PLAN  58 year old woman with a spontaneous right MCA aneurysm rupture, ICH along with the subarachnoid hemorrhage from the aneurysm, cerebral edema, seizures with worsening mental status and worsening exam with right upper extremity weakness.    Impression: Right MCA aneurysm causing subarachnoid hemorrhage intracranial hemorrhage Status epilepticus ( resolved) Seizures  DCI from vasospasm  Recommendations: -DC LTM as no seizures on over 24 hours - ContinueKeppra2000 twice daily and Phenytoin 100-100-130mg  daily.Corrected PHT level 13.8 on 01/18/2019. Will recheck level on Friday.  - Of note, patient has severe structural underlying abnormality which led to status. Therefore, our goal is to minimize seizures without causing excessive sedation and other side effects. If patient has seizures, we will optimize current AEDs with that goal in mind and not try to treat every subclinical seizure. - seizure  precautions  - PRN IV ativan only for GTC>2 mins   CRITICAL CARE Performed by: Lora Havens  Total critical care time:75minutes  Critical care time was exclusive of separately billable procedures and treating other patients.  Critical care was necessary to treat or prevent imminent or life-threatening deterioration.  Critical care was time spent personally by me on the following activities: development of treatment plan with patient and/or surrogate as well as nursing, discussions with consultants, evaluation of patient's response to treatment, examination of patient, obtaining history from patient or surrogate, ordering and performing treatments and interventions, ordering and review of laboratory studies, ordering and review of radiographic studies, pulse oximetry and re-evaluation of patient's condition.

## 2019-01-19 NOTE — Progress Notes (Signed)
LTM EEG discontinued - no skin breakdown at unhook.   

## 2019-01-19 NOTE — Procedures (Signed)
Patient [place]ta [id number] Epilepsy Attending:Chastelyn Athens Barbra Sarks Referring Physician/Provider:Dr Kipp Brood Duration:01/18/2019 1321to 01/19/2019 [id number]  Patient [id number] F with ams and SAH s/p MCA aneurysm clipping. EEG to evaluate for seizures  Level of alertness:comatose  AEDs during EEG study:LEV, PHT  Technical aspects: This EEG study was done with scalp electrodes positioned according to the 10-20 International system of electrode placement. Electrical activity was acquired at a sampling rate of 500Hz  and reviewed with a high frequency filter of 70Hz  and a low frequency filter of 1Hz . EEG data were recorded continuously and digitally stored.  DESCRIPTION: EEG showed continuous generalized 2-5hz  theta-delta slowing, maximal in right hemisphere. There were also sharp waves in left frontal region which at times appeared rhythmic with no clear evolution to suggest seizures.Hyperventilation and photic stimulation were not performed.   IMPRESSION: This studyshowedpotential epileptogenicity in left frontal region. No definite seizures were seen. There was also cortical dysfunction in right hemisphere consistent with prior craniotomy as well assevere diffuse encephalopathy.  EEG appears stable improved compared to previous study.

## 2019-01-19 NOTE — Progress Notes (Signed)
Transcranial Doppler  Date POD PCO2 HCT BP  MCA ACA PCA OPHT SIPH VERT Basilar  9/11 JP     Right  Left   *  *   *  *   *  *   33  36   48  *   *  *   -35      9/13 MR     Right  Left   43  *   *  *   *  *   25  17   37  51   *  *   *      9/16 MR     Right  Left   *  *   *  *   *  *   40  12   22  44   *  *   *  *          Right  Left                                             Right  Left                                            Right  Left                                            Right  Left                                        MCA = Middle Cerebral Artery      OPHT = Opthalmic Artery     BASILAR = Basilar Artery   ACA = Anterior Cerebral Artery     SIPH = Carotid Siphon PCA = Posterior Cerebral Artery   VERT = Verterbral Artery                   Normal MCA = 62+\-12 ACA = 50+\-12 PCA = 42+\-23   * = not insonated/ visualized. June Leap, BS, RDMS, RVT

## 2019-01-19 NOTE — Progress Notes (Signed)
  NEUROSURGERY PROGRESS NOTE   Pt seen and examined. No issues overnight.   EXAM: Temp:  [100.2 F (37.9 C)-102.1 F (38.9 C)] 102.1 F (38.9 C) (09/16 0800) Pulse Rate:  [70-122] 113 (09/16 0815) Resp:  [19-37] 28 (09/16 0815) BP: (126-202)/(54-160) 151/77 (09/16 0815) SpO2:  [96 %-100 %] 97 % (09/16 0815) Arterial Line BP: (115-198)/(71-189) 194/189 (09/15 1900) FiO2 (%):  [30 %-40 %] 30 % (09/16 0809) Intake/Output      09/15 0701 - 09/16 0700 09/16 0701 - 09/17 0700   I.V. (mL/kg) 4456.8 (51.3) 408 (4.7)   NG/GT 1280 45   IV Piggyback 731.5 0   Total Intake(mL/kg) 6468.3 (74.4) 453 (5.2)   Urine (mL/kg/hr) 5700 (2.7) 400 (2.3)   Stool 200    Blood     Total Output 5900 400   Net +568.3 +53         Minimal eye opening to pain Pupils reactive OU Right periorbital edema Breathing over vent W/d RUE/RLE to pain No movement LUE/LLE to pain Wound c/d/i  LABS: Lab Results  Component Value Date   CREATININE 0.55 01/19/2019   BUN 7 01/19/2019   NA 148 (H) 01/19/2019   K 3.3 (L) 01/19/2019   CL 112 (H) 01/19/2019   CO2 22 01/19/2019   Lab Results  Component Value Date   WBC 21.4 (H) 01/19/2019   HGB 8.3 (L) 01/19/2019   HCT 25.1 (L) 01/19/2019   MCV 83.1 01/19/2019   PLT 202 01/19/2019     IMPRESSION: - 58 y.o. female SAH d# 10 s/p RMCA clipping, remains minimally conscious with left hemiplegia, stable - ?distal small-vessel spasm - Status epilepticus, better controlled - Likely pneumonia on abx - Hypernatremia improving  PLAN: - Cont supportive care.  - Cont SBP goal >12mmHg with PRN levo/neo - Cont AEDs, may d/c LTM per neurology - vent/pna mgmt per PCCM - Cont free water q8 - mild hypernatremia with last Na 148

## 2019-01-19 NOTE — Progress Notes (Deleted)
[facility] ADULT ICU REPLACEMENT PROTOCOL FOR AM LAB REPLACEMENT ONLY  The patient does not apply for the [facility] Adult ICU Electrolyte Replacment Protocol based on the criteria listed below:   1. Is GFR >/= 40 ml/min? Yes.    Patient's GFR today is >60 2. Is urine output >/= 0.5 ml/kg/hr for the last 6 hours? Yes.   Patient's UOP is 2.1 ml/kg/hr 3. Is BUN < 60 mg/dL? Yes.    Patient's BUN today is 7 4. Abnormal electrolyte K 3.3 5. Ordered repletion with: protocol 6. If a panic level lab has been reported, has the CCM MD in charge been notified? Yes.  .   Physician:  Illene Labrador, Canary Brim 01/19/2019 6:04 AM

## 2019-01-19 NOTE — Progress Notes (Signed)
TCD has been completed.   Preliminary results in CV Proc.   Jessica Mayer 01/19/2019 11:35 AM

## 2019-01-19 NOTE — Progress Notes (Signed)
Assisted tele visit to patient with daughter.  Davyn Elsasser Anderson, RN   

## 2019-01-20 DIAGNOSIS — J9600 Acute respiratory failure, unspecified whether with hypoxia or hypercapnia: Secondary | ICD-10-CM

## 2019-01-20 DIAGNOSIS — I608 Other nontraumatic subarachnoid hemorrhage: Secondary | ICD-10-CM

## 2019-01-20 LAB — CBC
HCT: 25.4 % — ABNORMAL LOW (ref 36.0–46.0)
Hemoglobin: 8.1 g/dL — ABNORMAL LOW (ref 12.0–15.0)
MCH: 27.5 pg (ref 26.0–34.0)
MCHC: 31.9 g/dL (ref 30.0–36.0)
MCV: 86.1 fL (ref 80.0–100.0)
Platelets: 296 10*3/uL (ref 150–400)
RBC: 2.95 MIL/uL — ABNORMAL LOW (ref 3.87–5.11)
RDW: 15.4 % (ref 11.5–15.5)
WBC: 23 10*3/uL — ABNORMAL HIGH (ref 4.0–10.5)
nRBC: 1.9 % — ABNORMAL HIGH (ref 0.0–0.2)

## 2019-01-20 LAB — GLUCOSE, CAPILLARY
Glucose-Capillary: 120 mg/dL — ABNORMAL HIGH (ref 70–99)
Glucose-Capillary: 138 mg/dL — ABNORMAL HIGH (ref 70–99)
Glucose-Capillary: 141 mg/dL — ABNORMAL HIGH (ref 70–99)
Glucose-Capillary: 143 mg/dL — ABNORMAL HIGH (ref 70–99)
Glucose-Capillary: 168 mg/dL — ABNORMAL HIGH (ref 70–99)
Glucose-Capillary: 99 mg/dL (ref 70–99)

## 2019-01-20 LAB — BASIC METABOLIC PANEL
Anion gap: 13 (ref 5–15)
BUN: 6 mg/dL (ref 6–20)
CO2: 23 mmol/L (ref 22–32)
Calcium: 8.4 mg/dL — ABNORMAL LOW (ref 8.9–10.3)
Chloride: 109 mmol/L (ref 98–111)
Creatinine, Ser: 0.49 mg/dL (ref 0.44–1.00)
GFR calc Af Amer: >60 mL/min (ref >60)
GFR calc non Af Amer: >60 mL/min (ref >60)
Glucose, Bld: 141 mg/dL — ABNORMAL HIGH (ref 70–99)
Potassium: 3.4 mmol/L — ABNORMAL LOW (ref 3.5–5.1)
Sodium: 145 mmol/L (ref 135–145)

## 2019-01-20 LAB — PHOSPHORUS: Phosphorus: 2.8 mg/dL (ref 2.5–4.6)

## 2019-01-20 LAB — MAGNESIUM: Magnesium: 2.2 mg/dL (ref 1.7–2.4)

## 2019-01-20 MED ORDER — SODIUM CHLORIDE 0.9 % IV BOLUS
1000.0000 mL | Freq: Once | INTRAVENOUS | Status: AC
  Administered 2019-01-20: 22:00:00 1000 mL via INTRAVENOUS

## 2019-01-20 MED ORDER — CHLORHEXIDINE GLUCONATE 0.12 % MT SOLN
15.0000 mL | Freq: Two times a day (BID) | OROMUCOSAL | Status: DC
  Administered 2019-01-20 – 2019-01-26 (×13): 15 mL via OROMUCOSAL
  Filled 2019-01-20 (×4): qty 15

## 2019-01-20 MED ORDER — POTASSIUM CHLORIDE 20 MEQ PO PACK
40.0000 meq | PACK | ORAL | Status: AC
  Administered 2019-01-20: 40 meq
  Filled 2019-01-20: qty 2

## 2019-01-20 MED ORDER — POTASSIUM CHLORIDE 20 MEQ PO PACK
40.0000 meq | PACK | ORAL | Status: DC
  Administered 2019-01-20: 40 meq via ORAL
  Filled 2019-01-20: qty 2

## 2019-01-20 MED ORDER — CHLORHEXIDINE GLUCONATE CLOTH 2 % EX PADS
6.0000 | MEDICATED_PAD | Freq: Every day | CUTANEOUS | Status: DC
  Administered 2019-01-21 – 2019-01-25 (×6): 6 via TOPICAL

## 2019-01-20 MED ORDER — VITAL AF 1.2 CAL PO LIQD
1000.0000 mL | ORAL | Status: DC
  Administered 2019-01-20 – 2019-01-25 (×3): 1000 mL

## 2019-01-20 NOTE — Progress Notes (Signed)
Nutrition Follow-up  DOCUMENTATION CODES:   Not applicable  INTERVENTION:   D/C Vital High Protein and Prostat  Vital 1.2 @ 60 ml/hr (1440 ml/day) via Cortrak Provides: 1728 kcal, 108 grams protein, and 1167 ml free water.  Total free water: 2328 ml    NUTRITION DIAGNOSIS:   Inadequate oral intake related to acute illness, dysphagia, lethargy/confusion as evidenced by NPO status.  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 PMH includes HTN, anxiety  Pt discussed during ICU rounds and with RN.  Cortrak placed 9/11, tip in the stomach.  Pt on pressors to keep SBP 180-200. Pt now off hypertonic saline and has free water flushes ordered.   Patient is currently intubated on ventilator support MV: 12.3 L/min Temp (24hrs), Avg:99.6 F (37.6 C), Min:98.2 F (36.8 C), Max:101.7 F (38.7 C)  Labs reviewed.CBG's: [phone]  Medications reviewed and include novolog, levemir, senokot  Levophed 18 mcg Neosynephrine 200 mcg 200 ml free water every 8 hours = 600 ml   Cortrak: Vital High Protein @ 45 ml/hr with 30 ml Prostat BID Provides: 1280 kcal, 124.5 gm protein, 903 ml free water daily.  Diet Order:   Diet Order            Diet NPO time specified  Diet effective now              EDUCATION NEEDS:   Not appropriate for education at this time  Skin:  Skin Assessment: Reviewed RN Assessment(head incision)  Last BM:  700 ml via rectal tube x 24 hrs  Height:   Ht Readings from Last 1 Encounters:  01/13/19 5' 5" (1.651 m)    Weight:   Wt Readings from Last 1 Encounters:  01/18/19 86.9 kg   Admission weight 84 kg (BMI 30.8)  Ideal Body Weight:  56.8 kg  Estimated  Nutritional Needs:   Kcal:  1810  Protein:  101-115 grams  Fluid:  >/= 1.8 L  Maylon Peppers RD, LDN, Bertha Pager [phone] After Hours Pager

## 2019-01-20 NOTE — Progress Notes (Signed)
  NEUROSURGERY PROGRESS NOTE   Pt seen and examined. No issues overnight.   EXAM: Temp:  [98.2 F (36.8 C)-101.7 F (38.7 C)] 99.9 F (37.7 C) (09/17 0900) Pulse Rate:  [73-152] 84 (09/17 0900) Resp:  [20-33] 25 (09/17 0900) BP: (116-202)/(70-132) 170/97 (09/17 0900) SpO2:  [91 %-100 %] 100 % (09/17 1156) FiO2 (%):  [30 %] 30 % (09/17 1156) Intake/Output      09/16 0701 - 09/17 0700 09/17 0701 - 09/18 0700   I.V. (mL/kg) 4198.9 (48.3) 329.3 (3.8)   NG/GT 1280 145   IV Piggyback 443    Total Intake(mL/kg) 5921.8 (68.1) 474.3 (5.5)   Urine (mL/kg/hr) 7350 (3.5)    Stool 700    Total Output 8050    Net -2128.2 +474.3         Opens eyes to noxious stimuli Pupils reactive Breathing spontaneously Briskly w/d RUE/RLE, not FC for me No movement LUE/LLE Wound c/d/i  LABS: Lab Results  Component Value Date   CREATININE 0.49 01/20/2019   BUN 6 01/20/2019   NA 145 01/20/2019   K 3.4 (L) 01/20/2019   CL 109 01/20/2019   CO2 23 01/20/2019   Lab Results  Component Value Date   WBC 23.0 (H) 01/20/2019   HGB 8.1 (L) 01/20/2019   HCT 25.4 (L) 01/20/2019   MCV 86.1 01/20/2019   PLT 296 01/20/2019    IMPRESSION: - 58 y.o. female 25 s/p RMCA clipping, improved neurologically today - more briskly responding, FC for RN earlier this am - ?distal spasm - Status appears controlled - PNA - hypernatremia - resolved, last Na 145  PLAN: - Cont vent mgmt per PCCM, cont ceftriaxone - SBP goal > 157mmHg - Cont PHT/LEV - decrease free H20

## 2019-01-20 NOTE — Progress Notes (Signed)
PULMONARY / CRITICAL CARE MEDICINE   NAME:  Auria Dembek, MRN:  [id number], DOB:  Jan 20, 1961, LOS: 9 ADMISSION DATE:  01/07/2019, CONSULTATION DATE: 01/11/2019 REFERRING MD: Neurosurgery, CHIEF COMPLAINT: Vent dependent respiratory failure secondary to right MCA clipping and craniotomy  BRIEF HISTORY:    58 year old female with a history of noncompliance with hypertensive medication who had altered mental status for 48 hours prior to admission on 01/25/2019 found to have a right MCA aneurysm was taken to surgery on 01/25/2019 left intubated pulmonary critical care consulted. Hospital course c/b VAP, seizures, vasospasm  SIGNIFICANT PAST MEDICAL HISTORY   Hypertension with noncompliance of medications Glaucoma on 3 eyedrops Anxiety  SIGNIFICANT EVENTS:  02/01/2019 - right frontal temporal craniotomy with clipping of right middle cerebral aneurysm and evacuation of right temporal hematoma 9/8-9/9 Received Hypertonic saline  01/14/2019 - started on LTM for nonconvulsive focal status epilepticus.  AEDs initiated. 9/11-9/13 Received DDAVP 01/15/2019 -blood pressure augmentation for suspected vasospasm 9/13 DDAVP stopped  STUDIES:   01/22/2019 CT scans revealing a right MCA aneurysm and hematoma 01/13/2019:  ECHO: EF 65%, normal RV, mild MV Regurg 01/15/2019 CT scan shows stable residual hematoma but increased surrounding edema. 9/13 CT head: no additional ICH, suspicion of diffuse intracranial spasm 9/14 Diagnostic Cerebral Angiogram: No significant intracranial vasospasm, perhaps mild vasospasm involving the R M1 segment 9/14 TCD: limited study, normal flow  9/16 TCD: limited study, normal flow    CULTURES:  MRSA>>negative SARS-CoV-2>> negative 01/24/2019: Blood Cx>> 01/10/2019: Respiratory Cx>> Staph Aureus and Enterobacter>>Staph pan-sensitive, Enterobacter resistant to cefazolin 01/18/19: C Diff>>neg  ANTIBIOTICS:  Vanc 9/14>>9/16 Zosyn 9/14>>9/16 Ceftriaxone 9/16>>   LINES/TUBES:   01/30/2019 endotracheal tube>> 01/14/2019; 9/11>> 9/11 CVC L subclavian>> 9/13 Arterial Line>>  CONSULTANTS:  pulmonary critical care Neurology  SUBJECTIVE:  No acute events overnight, remains responsive to pain only and on pressors to keep SBP at hoal  CONSTITUTIONAL: BP (!) 196/98   Pulse 82   Temp 99.7 F (37.6 C)   Resp (!) 25   Ht 5\' 5"  (1.651 m)   Wt 86.9 kg   LMP 05/29/2010   SpO2 99%   BMI 31.88 kg/m   I/O last 3 completed shifts: In: [id number] [I.V.:6152.9; NG/GT:2515; IV Piggyback:873.1] Out: [id number] [Urine:10550; Stool:800]     Vent Mode: PRVC FiO2 (%):  [30 %] 30 % Set Rate:  [18 bmp] 18 bmp Vt Set:  [450 mL] 450 mL PEEP:  [5 cmH20] 5 cmH20 Plateau Pressure:  [14 cmH20-18 cmH20] 16 cmH20   General: sleeping, intubated F, no distress, not sedated  HEENT: MM pink/moist, incision on head dry and bandaged Neuro: slightly more spontaneous movement on the R side today, withdraws lower extremity to pain.  PERRLA, edema of the R eyelid CV: s1s2 RRR, no m/r/g PULM:  Improving secretions, decreased air movement in the bilateral bases GI: soft, bsx4 active  Extremities: warm/dry, no edema  Skin: no rashes or lesions    RESOLVED PROBLEM LIST  Status Eppilepticus    ASSESSMENT AND PLAN   R MCA aneurysm causing Subarachnoid Hemorrhage, s/p R MCA aneurysm clipping and subsequent mild M1 vasospasm; persistent encephalopathy -TCD's thus far are not showing evidence of vasospasm -Remains minimally responsive to pain P: -Remains on Levophed and Neosynephrine to achieve SBP 180-200 -Fluid support to maintain euvolemia -TCD's MWF -Continue Nimodipine for vasospasm   Status Epilepticus -Status resolved, seizures improved, continuous EEG discontinued -Neurology is following P: -Continue Keppra 2000mg  bid and Phenytoin 100-100-130mg  qd -Prn ativan if tonic -clonic seizures >14mins,  try to avoid sedation    Acute Hypoxic Respiratory failure, VAP -respiratory  cultures growing Staph and enterobacter  -less secretions today -neurologic status barrier to extubation P: -Continue Ceftriaxone -Pt is breathing above the Vent, SBT as able -VAP bundle    Iatrogenic Hypernatremia -improving -Received hypertonic saline from 9-8-9/9 and subsequently developed DI -DDAVP stopped 9/13 P: -Continue to monitor BMET -Cont. FWF q8hrs   Hypokalemia -Continue to follow BMET and replace   Hyperglycemia due to stress -SSI and Basal Insulin P: -maintain euglycemia        Best Practice / Goals of Care / Disposition.   DVT PROPHYLAXIS:SCD's. D/w NSGY today appropriate timing of DVT ppx >> per Dr Kathyrn Sheriff ok for DVT ppx today.  SUP: PPI NUTRITION: N.p.o -tube feeds. MOBILITY: Bedrest GOALS OF CARE: Full code FAMILY DISCUSSIONS: Updated 9/14. Attempted to contact today. DISPOSITION: ICU on mechanical ventilation  LABS  Glucose Recent Labs  Lab 01/19/19 1130 01/19/19 1541 01/19/19 1949 01/19/19 2337 01/20/19 0340 01/20/19 0732  GLUCAP 162* 137* 148* 162* 143* 138*   Dg Chest Port 1 View  Result Date: 01/19/2019 CLINICAL DATA:  Encounter for central line care EXAM: PORTABLE CHEST 1 VIEW COMPARISON:  Two days ago FINDINGS: Endotracheal tube tip just below the clavicular heads. The feeding tube at least reaches the distal stomach. Left subclavian line with tip at the SVC. Artifact from EKG leads. Indistinct opacification in the lower lungs. No edema, effusion, or pneumothorax. Normal heart size. IMPRESSION: 1. Unremarkable hardware positioning. 2. Stable atelectasis or infiltrate in the lower lungs. Electronically Signed   By: Monte Fantasia M.D.   On: 01/19/2019 05:51   Vas [place] Transcranial Doppler  Result Date: 01/19/2019  Transcranial Doppler Indications: Subarachnoid hemorrhage. Comparison Study: 01/23/2019 Performing Technologist: Abram Sander RVS  Examination Guidelines: A complete evaluation includes B-mode imaging, spectral Doppler,  color Doppler, and power Doppler as needed of all accessible portions of each vessel. Bilateral testing is considered an integral part of a complete examination. Limited examinations for reoccurring indications may be performed as noted.  +----------+-------------+----------+-----------+-------+ RIGHT TCD Right VM (cm)Depth (cm)PulsatilityComment +----------+-------------+----------+-----------+-------+ Term ICA      38.00                 1.32            +----------+-------------+----------+-----------+-------+ Opthalmic     40.00                 1.39            +----------+-------------+----------+-----------+-------+ ICA siphon    22.00                 1.31            +----------+-------------+----------+-----------+-------+  +----------+------------+----------+-----------+-------+ LEFT TCD  Left VM (cm)Depth (cm)PulsatilityComment +----------+------------+----------+-----------+-------+ Opthalmic    12.00                 1.52            +----------+------------+----------+-----------+-------+ ICA siphon   44.00                 1.19            +----------+------------+----------+-----------+-------+  Summary:  Absent bitemporal windows limits evaluation of anterior circulation bilaterally. Normal mean flow velocities in bilateral terminal ICAs and opthalmic arteries *See table(s) above for measurements and observations.  Diagnosing physician: Antony Contras MD Electronically signed by Antony Contras MD on 01/19/2019 at 4:45:21 PM.    Final  BMET Recent Labs  Lab 01/30/2019 1647 01/18/19 0347 01/19/19 0401  NA 150* 149* 148*  K 3.1* 3.2* 3.3*  CL 115* 114* 112*  CO2 24 23 22   BUN 9 9 7   CREATININE 0.70 0.61 0.55  GLUCOSE 137* 122* 145*    Liver Enzymes Recent Labs  Lab 01/27/2019 0924 01/18/19 0347  AST 25  --   ALT 29  --   ALKPHOS 70  --   BILITOT 0.6  --   ALBUMIN 2.2* 2.1*    Electrolytes Recent Labs  Lab 01/15/19 0432  01/09/2019 1647  01/18/19 0347 01/19/19 0401  CALCIUM 8.7*   < > 8.0* 8.3* 8.1*  MG 1.6*  --   --  1.8 2.2  PHOS 2.7  --   --  2.0* 2.5   < > = values in this interval not displayed.    CBC Recent Labs  Lab 01/12/2019 0924 01/18/19 0347 01/19/19 0401  WBC 17.2* 21.5* 21.4*  HGB 8.4* 7.9* 8.3*  HCT 26.2* 24.0* 25.1*  PLT 137* 150 202    ABG No results for input(s): PHART, PCO2ART, PO2ART in the last 168 hours.  Coag's No results for input(s): APTT, INR in the last 168 hours.  Sepsis Markers No results for input(s): LATICACIDVEN, PROCALCITON, O2SATVEN in the last 168 hours.  Cardiac Enzymes No results for input(s): TROPONINI, PROBNP in the last 168 hours.   CRITICAL CARE Performed by: Otilio Carpen Raivyn Kabler   Total critical care time: 45 minutes  Critical care time was exclusive of separately billable procedures and treating other patients.  Critical care was necessary to treat or prevent imminent or life-threatening deterioration.  Critical care was time spent personally by me on the following activities: development of treatment plan with patient and/or surrogate as well as nursing, discussions with consultants, evaluation of patient's response to treatment, examination of patient, obtaining history from patient or surrogate, ordering and performing treatments and interventions, ordering and review of laboratory studies, ordering and review of radiographic studies, pulse oximetry and re-evaluation of patient's condition.

## 2019-01-20 NOTE — Progress Notes (Signed)
Assisted tele visit to patient with daughter.  Lenville Hibberd P, RN  

## 2019-01-21 ENCOUNTER — Inpatient Hospital Stay (HOSPITAL_COMMUNITY): Payer: Medicaid Other

## 2019-01-21 DIAGNOSIS — I609 Nontraumatic subarachnoid hemorrhage, unspecified: Secondary | ICD-10-CM

## 2019-01-21 DIAGNOSIS — I739 Peripheral vascular disease, unspecified: Secondary | ICD-10-CM

## 2019-01-21 DIAGNOSIS — I67848 Other cerebrovascular vasospasm and vasoconstriction: Secondary | ICD-10-CM

## 2019-01-21 LAB — GLUCOSE, CAPILLARY
Glucose-Capillary: 132 mg/dL — ABNORMAL HIGH (ref 70–99)
Glucose-Capillary: 135 mg/dL — ABNORMAL HIGH (ref 70–99)
Glucose-Capillary: 141 mg/dL — ABNORMAL HIGH (ref 70–99)
Glucose-Capillary: 148 mg/dL — ABNORMAL HIGH (ref 70–99)
Glucose-Capillary: 149 mg/dL — ABNORMAL HIGH (ref 70–99)
Glucose-Capillary: 152 mg/dL — ABNORMAL HIGH (ref 70–99)

## 2019-01-21 LAB — BASIC METABOLIC PANEL
Anion gap: 10 (ref 5–15)
BUN: 7 mg/dL (ref 6–20)
CO2: 24 mmol/L (ref 22–32)
Calcium: 8.4 mg/dL — ABNORMAL LOW (ref 8.9–10.3)
Chloride: 109 mmol/L (ref 98–111)
Creatinine, Ser: 0.42 mg/dL — ABNORMAL LOW (ref 0.44–1.00)
GFR calc Af Amer: >60 mL/min (ref >60)
GFR calc non Af Amer: >60 mL/min (ref >60)
Glucose, Bld: 158 mg/dL — ABNORMAL HIGH (ref 70–99)
Potassium: 3.7 mmol/L (ref 3.5–5.1)
Sodium: 143 mmol/L (ref 135–145)

## 2019-01-21 LAB — CBC
HCT: 25.2 % — ABNORMAL LOW (ref 36.0–46.0)
Hemoglobin: 7.8 g/dL — ABNORMAL LOW (ref 12.0–15.0)
MCH: 27 pg (ref 26.0–34.0)
MCHC: 31 g/dL (ref 30.0–36.0)
MCV: 87.2 fL (ref 80.0–100.0)
Platelets: 402 10*3/uL — ABNORMAL HIGH (ref 150–400)
RBC: 2.89 MIL/uL — ABNORMAL LOW (ref 3.87–5.11)
RDW: 16.2 % — ABNORMAL HIGH (ref 11.5–15.5)
WBC: 25.6 10*3/uL — ABNORMAL HIGH (ref 4.0–10.5)
nRBC: 2.8 % — ABNORMAL HIGH (ref 0.0–0.2)

## 2019-01-21 LAB — PHENYTOIN LEVEL, TOTAL
Phenytoin Lvl: <2.5 ug/mL — ABNORMAL LOW (ref 10.0–20.0)
Phenytoin Lvl: <2.5 ug/mL — ABNORMAL LOW (ref 10.0–20.0)

## 2019-01-21 LAB — ALBUMIN: Albumin: 2.1 g/dL — ABNORMAL LOW (ref 3.5–5.0)

## 2019-01-21 MED ORDER — NIMODIPINE 6 MG/ML PO SOLN
30.0000 mg | ORAL | Status: DC
  Administered 2019-01-21 – 2019-01-25 (×47): 30 mg
  Filled 2019-01-21 (×2): qty 10
  Filled 2019-01-21: qty 5
  Filled 2019-01-21: qty 10
  Filled 2019-01-21: qty 5
  Filled 2019-01-21 (×2): qty 10
  Filled 2019-01-21: qty 5
  Filled 2019-01-21: qty 10
  Filled 2019-01-21 (×2): qty 5
  Filled 2019-01-21 (×2): qty 10
  Filled 2019-01-21: qty 5
  Filled 2019-01-21: qty 10
  Filled 2019-01-21 (×2): qty 5
  Filled 2019-01-21 (×3): qty 10
  Filled 2019-01-21: qty 5
  Filled 2019-01-21 (×5): qty 10
  Filled 2019-01-21: qty 5
  Filled 2019-01-21 (×4): qty 10
  Filled 2019-01-21: qty 5
  Filled 2019-01-21: qty 10
  Filled 2019-01-21 (×3): qty 5
  Filled 2019-01-21 (×6): qty 10

## 2019-01-21 MED ORDER — LOPERAMIDE HCL 1 MG/7.5ML PO SUSP
2.0000 mg | Freq: Three times a day (TID) | ORAL | Status: DC | PRN
  Administered 2019-01-21: 2 mg
  Filled 2019-01-21: qty 15

## 2019-01-21 MED ORDER — NIMODIPINE 6 MG/ML PO SOLN: 30.0000 mg | ORAL | Status: DC

## 2019-01-21 MED ORDER — PHENYTOIN SODIUM 50 MG/ML IJ SOLN
100.0000 mg | Freq: Every day | INTRAMUSCULAR | Status: DC
  Administered 2019-01-22 – 2019-01-24 (×3): 100 mg via INTRAVENOUS
  Filled 2019-01-21 (×3): qty 2

## 2019-01-21 MED ORDER — LOPERAMIDE HCL 1 MG/7.5ML PO SUSP
2.0000 mg | Freq: Three times a day (TID) | ORAL | Status: DC | PRN
  Administered 2019-01-21: 2 mg via ORAL
  Filled 2019-01-21: qty 15

## 2019-01-21 MED ORDER — ALBUMIN HUMAN 25 % IV SOLN
12.5000 g | Freq: Once | INTRAVENOUS | Status: AC
  Administered 2019-01-21: 12.5 g via INTRAVENOUS
  Filled 2019-01-21: qty 50

## 2019-01-21 MED ORDER — SODIUM CHLORIDE 0.9 % IV SOLN
130.0000 mg | Freq: Two times a day (BID) | INTRAVENOUS | Status: DC
  Administered 2019-01-21 – 2019-01-23 (×5): 130 mg via INTRAVENOUS
  Filled 2019-01-21 (×7): qty 2.6

## 2019-01-21 MED ORDER — POTASSIUM CHLORIDE 20 MEQ PO PACK
40.0000 meq | PACK | Freq: Once | ORAL | Status: AC
  Administered 2019-01-21: 11:00:00 40 meq
  Filled 2019-01-21: qty 2

## 2019-01-21 NOTE — Progress Notes (Addendum)
MEDICATION RELATED CONSULT NOTE - FOLLOW UP  Pharmacy Consult for Phenytoin Indication: seizures  Allergies  Allergen Reactions  . Asa Buff (Mag [Buffered Aspirin] Nausea And Vomiting    Patient Measurements: Height: 5\' 5"  (165.1 cm) Weight: 196 lb 6.9 oz (89.1 kg) IBW/kg (Calculated) : 57  Adjusted body weight: 68.6 kg  Vital Signs: Temp: 99.5 F (37.5 C) (09/18 1045) Temp Source: Esophageal (09/18 0800) BP: 198/105 (09/18 1045) Pulse Rate: 95 (09/18 1153) Intake/Output from previous day: 09/17 0701 - 09/18 0700 In: [id number] [I.V.:4068; NG/GT:1915; IV Piggyback:1505] Out: [id number] [Urine:6550; Stool:700] Intake/Output from this shift: Total I/O In: 816.7 [I.V.:516.7; NG/GT:300] Out: 1450 [Urine:1450]  Estimated Creatinine Clearance: 82.4 mL/min (A) (by C-G formula based on SCr of 0.42 mg/dL (L)).    Assessment: 58 YO female presenting with AMS found to have [place] s/p MCA aneurysm. EEG on 9/12 showed 10 seizures from R frontocentral region with new epileptogenicity in L frontal region. Patient was started on phenytoin 100 mg Q8h on 9/11. Pharmacy consulted to assist with maintenance dosing of phenytoin.  Repeat phenytoin level is still undetectable at < 2.5 mcg/mL after increasing total daily dose to 330 mg on 9/15. Albumin is low today at 2.1. Renal function remains stable.  Per conversation with neuro on 9/16, the plan was not to be aggressive with phenytoin dosing as long as level > 10 mcg/mL since she is not have any more seizures. However, will increase dose ~10% today since level was undetectable today and re-check level at steady state.  Drug Interactions with Phenytoin:  Phenytoin is a strong CYP3A4 inducer and nimodipine is extensively metabolized by CYP3A4. This interaction causes markedly reduced nimodipine levels.  - Patient is currently receiving nimodipine Q2h instead of Q4h. Will continue to monitor.  Goal of Therapy:  Phenytoin level 10-20 mcg/mL  Plan:  -  Increase phenytoin to a total daily dose of 360 mg IV (100 mg at 0600; 130 mg at 1400 and 2200) - Check phenytoin trough and albumin in 3-4 days - Monitor renal function, LFTs, albumin - Monitor phenytoin-nimodipine DDI  Richardine Service, PharmD PGY1 Pharmacy Resident Phone: [phone] 01/21/2019  4:15 PM  Please check [email or web address] for unit-specific pharmacy phone numbers.

## 2019-01-21 NOTE — Progress Notes (Signed)
Transcranial Doppler  Date POD PCO2 HCT BP  MCA ACA PCA OPHT SIPH VERT Basilar  9/11 JP     Right  Left   *  *   *  *   *  *   33  36   48  *   *  *   -35      9/13 MR     Right  Left   43  *   *  *   *  *   25  17   37  51   *  *   *      9/14 JP     Right  Left   *  *   *  *   *  *   30  24   52  76   *  *   *      9/16 MR      Right  Left   *  *   *  *   *  *   40  12   22  44   *  *   *      9/18 MS      Right  Left   *  *   *  *   *  *   22  21   42  18   -56  *   *           Right  Left                                            Right  Left                                        MCA = Middle Cerebral Artery      OPHT = Opthalmic Artery     BASILAR = Basilar Artery   ACA = Anterior Cerebral Artery     SIPH = Carotid Siphon PCA = Posterior Cerebral Artery   VERT = Verterbral Artery                   Normal MCA = 62+\-12 ACA = 50+\-12 PCA = 42+\-23   * = not insonated/ visualized.   01/21/2019 2:51 PM Jessica Mayer, MHA, RVT, RDCS, RDMS

## 2019-01-21 NOTE — Progress Notes (Signed)
  NEUROSURGERY PROGRESS NOTE   Difficulties with SBP goal last night. Levo increased. No other changes.  EXAM:  BP (!) 179/91 Comment: levo titrated up  Pulse (!) 107   Temp 99.9 F (37.7 C)   Resp (!) 24   Ht 5\' 5"  (1.651 m)   Wt 89.1 kg   LMP 05/29/2010   SpO2 98%   BMI 32.69 kg/m   Opens eyes to noxious stimuli Pupils reactive Breathing spontaenously  Follows commands with right grip and wiggle RLE Briskly w/d Rue/RLE to pain No movement LUE/LLE Wound c/d/i  IMPRESSION/PLAN 58 y.o. female  [place] d#12 s/p RMCA clipping. Doing well, following commands. - ?distal spasm: continue SBP goal >180.  - Status: appears controlled. Continue AED per neuro - PNA: continue abx per PCCM - Continue supportive care

## 2019-01-21 NOTE — Progress Notes (Signed)
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  AEDs initiated. 9/11-9/13 Received DDAVP 01/15/2019 -blood pressure augmentation for suspected vasospasm 9/13 DDAVP stopped 9/18 Weaning from Vent  STUDIES:   01/18/2019 CT scans revealing a right MCA aneurysm and hematoma 01/13/2019:  ECHO: EF 65%, normal RV, mild MV Regurg 01/15/2019 CT scan shows stable residual hematoma but increased surrounding edema. 9/13 CT head: no additional ICH, suspicion of diffuse intracranial spasm 9/14 Diagnostic Cerebral Angiogram: No significant intracranial vasospasm, perhaps mild vasospasm involving the R M1 segment 9/14 TCD: limited study, normal flow  9/16 TCD: limited study, normal flow    CULTURES:  MRSA>>negative SARS-CoV-2>> negative 01/16/2019: Blood Cx>> 01/11/2019: Respiratory Cx>> Staph Aureus and Enterobacter>>Staph pan-sensitive, Enterobacter resistant to cefazolin 01/18/19: C Diff>>neg  ANTIBIOTICS:  Vanc 9/14>>9/16 Zosyn 9/14>>9/16 Ceftriaxone  9/16>>   LINES/TUBES:  02/02/2019 endotracheal tube>> 01/25/2019; 9/11>> 9/11 CVC L subclavian>> 9/13 Arterial Line>>  CONSULTANTS:  pulmonary critical care Neurology  SUBJECTIVE:  SBP below goal overnight, given 1L bolus and levophed increased.  Now following commands  CONSTITUTIONAL: BP (!) 179/91 Comment: levo titrated up  Pulse (!) 107   Temp 99.9 F (37.7 C)   Resp (!) 24   Ht 5\' 5"  (1.651 m)   Wt 89.1 kg   LMP 05/29/2010   SpO2 98%   BMI 32.69 kg/m   I/O last 3 completed shifts: In: 10436.5 [I.V.:6053.4; NG/GT:2655; IV Piggyback:1728.1] Out: [id number] [Urine:9550; Stool:1150]     Vent Mode: PRVC FiO2 (%):  [30 %] 30 % Set Rate:  [18 bmp] 18 bmp Vt Set:  [450 mL] 450 mL PEEP:  [5 cmH20] 5 cmH20 Pressure Support:  [10 cmH20] 10 cmH20 Plateau Pressure:  [10 [id number] cmH20] 18 cmH20    General:  Intubated F, no distress, following commands HEENT: MM pink/moist, healing incision site of the L forehead  Neuro: sleeping, does not open eyes spontaneously, but is following commands on the R side, moving upper and lower extremities and will squeeze hand.  Not moving the L side  CV: s1s2 tachycardic, regular rhyth,, no m/r/g PULM: intubated, good air movement upper lobes, rhonchi RLL GI: soft, bsx4 active  Extremities: warm/dry, no edema  Skin: no rashes or lesions, L subclavian central line with serous drainage, no surrounding erythema or edema     RESOLVED PROBLEM LIST  Status Epilepticus Hyponatremia   ASSESSMENT AND PLAN   R MCA aneurysm causing Subarachnoid Hemorrhage, s/p R MCA aneurysm clipping and subsequent mild M1 vasospasm; persistent encephalopathy -encephalopathy improving, now following commands -increased Norepinephrin and 1 L bolus overnight to miantain BP goal P: -Management per Neursurgery -Remains on Norepinephrine and Phenylephrine  to achieve SBP  180-200 -Increase Levophed if needed, Albumin 25gx1 today, consider Midodrine -Maintain  Euvolemia -TCD's M/W/F -Continue Nimodipine for vasospasm prophylaxis   Seizures -initially with status, this has resolved and no longer requiring continuous monitoring -Neurology following P: -Continue AED, Keppra 2000mg  bid and Phenytoin 100-100-130mg  qd -PRM ativan for tonic-clonic seizures >49mins    Acute Hypoxic Respiratory failure, Pneumonia -respiratory cultures growing Staph and enterobacter  -secretions continue to improve P: -Continue Ceftriaxone -Pt is breathing above the Vent and has improving neurologic exam, SBT today, titrate PEEP and FiO2 to maintain O2>94%  -VAP bundle    Iatrogenic Hypernatremia -resolved today -Received hypertonic saline from 9-8-9/9 and subsequently developed DI -DDAVP stopped 9/13 P: -Continue to follow BMP -FWF qw8hrs, may need to d/c or decrease if Na continues to down-trend    Hypokalemia P -replaced and improving   Hyperglycemia due to stress -SSI and Basal Insulin P: -maintain euglycemia   Leukocytosis  -uptrending, likely reactive/secondary to pneumonia, low grade fever overnight  -Has loose stools, but C. Diff negative -L subclav line with serous drainage only P: -repeat CBC tomorrow, consider replacing central line with PICC     Best Practice / Goals of Care / Disposition.   DVT PROPHYLAXIS: heparin SUP: PPI NUTRITION: N.p.o -tube feeds. MOBILITY: Bedrest GOALS OF CARE: Full code FAMILY DISCUSSIONS: No answer 9/17, will attempt to contact Pt's daughter Mardene Celeste today DISPOSITION: ICU on mechanical ventilation  LABS  Glucose Recent Labs  Lab 01/20/19 1206 01/20/19 1609 01/20/19 1938 01/20/19 2331 01/21/19 0333 01/21/19 0747  GLUCAP 168* 120* 141* 99 152* 141*   Vas [place] Transcranial Doppler  Result Date: 01/19/2019  Transcranial Doppler Indications: Subarachnoid hemorrhage. Comparison Study: 01/18/2019 Performing Technologist: Abram Sander RVS  Examination Guidelines: A complete evaluation includes  B-mode imaging, spectral Doppler, color Doppler, and power Doppler as needed of all accessible portions of each vessel. Bilateral testing is considered an integral part of a complete examination. Limited examinations for reoccurring indications may be performed as noted.  +----------+-------------+----------+-----------+-------+ RIGHT TCD Right VM (cm)Depth (cm)PulsatilityComment +----------+-------------+----------+-----------+-------+ Term ICA      38.00                 1.32            +----------+-------------+----------+-----------+-------+ Opthalmic     40.00                 1.39            +----------+-------------+----------+-----------+-------+ ICA siphon    22.00                 1.31            +----------+-------------+----------+-----------+-------+  +----------+------------+----------+-----------+-------+ LEFT TCD  Left VM (cm)Depth (cm)PulsatilityComment +----------+------------+----------+-----------+-------+ Opthalmic    12.00                 1.52            +----------+------------+----------+-----------+-------+ ICA siphon   44.00                 1.19            +----------+------------+----------+-----------+-------+  Summary:  Absent bitemporal windows limits evaluation of anterior circulation bilaterally. Normal mean flow velocities in bilateral terminal ICAs and opthalmic arteries *See table(s) above for measurements and observations.  Diagnosing physician: Antony Contras MD Electronically signed by Antony Contras MD on 01/19/2019 at 4:45:21 PM.    Final     BMET Recent Labs  Lab 01/19/19 0401 01/20/19 0945 01/21/19  0500  NA 148* 145 143  K 3.3* 3.4* 3.7  CL 112* 109 109  CO2 22 23 24   BUN 7 6 7   CREATININE 0.55 0.49 0.42*  GLUCOSE 145* 141* 158*    Liver Enzymes Recent Labs  Lab 01/25/2019 0924 01/18/19 0347 01/21/19 0643  AST 25  --   --   ALT 29  --   --   ALKPHOS 70  --   --   BILITOT 0.6  --   --   ALBUMIN 2.2* 2.1* 2.1*     Electrolytes Recent Labs  Lab 01/18/19 0347 01/19/19 0401 01/20/19 0945 01/21/19 0500  CALCIUM 8.3* 8.1* 8.4* 8.4*  MG 1.8 2.2 2.2  --   PHOS 2.0* 2.5 2.8  --     CBC Recent Labs  Lab 01/19/19 0401 01/20/19 0945 01/21/19 0500  WBC 21.4* 23.0* 25.6*  HGB 8.3* 8.1* 7.8*  HCT 25.1* 25.4* 25.2*  PLT 202 296 402*    ABG No results for input(s): PHART, PCO2ART, PO2ART in the last 168 hours.  Coag's No results for input(s): APTT, INR in the last 168 hours.  Sepsis Markers No results for input(s): LATICACIDVEN, PROCALCITON, O2SATVEN in the last 168 hours.  Cardiac Enzymes No results for input(s): TROPONINI, PROBNP in the last 168 hours.   CRITICAL CARE Performed by: Otilio Carpen Ziya Coonrod   Total critical care time: 35 minutes  Critical care time was exclusive of separately billable procedures and treating other patients.  Critical care was necessary to treat or prevent imminent or life-threatening deterioration.  Critical care was time spent personally by me on the following activities: development of treatment plan with patient and/or surrogate as well as nursing, discussions with consultants, evaluation of patient's response to treatment, examination of patient, obtaining history from patient or surrogate, ordering and performing treatments and interventions, ordering and review of laboratory studies, ordering and review of radiographic studies, pulse oximetry and re-evaluation of patient's condition.

## 2019-01-21 NOTE — Progress Notes (Signed)
MEDICATION RELATED CONSULT NOTE - FOLLOW UP  Pharmacy Consult for Phenytoin Indication: seizures  Allergies  Allergen Reactions  . Asa Buff (Mag [Buffered Aspirin] Nausea And Vomiting    Patient Measurements: Height: 5\' 5"  (165.1 cm) Weight: 196 lb 6.9 oz (89.1 kg) IBW/kg (Calculated) : 57  Adjusted body weight: 68.6 kg  Vital Signs: Temp: 99.9 F (37.7 C) (09/18 0700) Temp Source: Esophageal (09/18 0400) BP: 179/91 (09/18 0700) Pulse Rate: 107 (09/18 0700) Intake/Output from previous day: 09/17 0701 - 09/18 0700 In: 7254 [I.V.:3894; [id number]; IV Piggyback:1505] Out: [id number] [Urine:6550; Stool:700] Intake/Output from this shift: No intake/output data recorded.  Estimated Creatinine Clearance: 82.4 mL/min (A) (by C-G formula based on SCr of 0.42 mg/dL (L)).    Assessment: 58 YO female presenting with AMS found to have [place] s/p MCA aneurysm. EEG on 9/12 showed 10 seizures from R frontocentral region with new epileptogenicity in L frontal region. Patient was started on phenytoin 100 mg Q8h on 9/11. Pharmacy consulted to assist with maintenance dosing of phenytoin.  Phenytoin trough level is subtherapeutic this morning at < 2.5 mcg/mL. I believe this is likely an error as the patient's corrected phenytoin level earlier this week was 13.8 and their dose was increased. Will re-check another phenytoin level prior to next dose and adjust doses accordingly.  Goal of Therapy:  Phenytoin level 15-20 mcg/mL  Plan:  - Re-check phenytoin trough prior to next dose at 1400 - Check albumin level  Richardine Service, PharmD PGY1 Pharmacy Resident Phone: [phone] 01/21/2019  7:11 AM  Please check [email or web address] for unit-specific pharmacy phone numbers.

## 2019-01-21 NOTE — Progress Notes (Signed)
SBP consistently below 180 despite increase in vasopressors. Neurosurgery notified, orders received for 1L bolus of NS. Bolus did not improve BP, instructed by neurosurgery to consult CCM to inquire about possibility of blood products. [place] notified.

## 2019-01-22 LAB — POCT I-STAT 7, (LYTES, BLD GAS, ICA,H+H)
Acid-Base Excess: 3 mmol/L — ABNORMAL HIGH (ref 0.0–2.0)
Bicarbonate: 26.2 mmol/L (ref 20.0–28.0)
Calcium, Ion: 1.2 mmol/L (ref 1.15–1.40)
HCT: 36 % (ref 36.0–46.0)
Hemoglobin: 12.2 g/dL (ref 12.0–15.0)
O2 Saturation: 93 %
Patient temperature: 99.5
Potassium: 3.7 mmol/L (ref 3.5–5.1)
Sodium: 143 mmol/L (ref 135–145)
TCO2: 27 mmol/L (ref 22–32)
pCO2 arterial: 36.1 mmHg (ref 32.0–48.0)
pH, Arterial: 7.471 — ABNORMAL HIGH (ref 7.350–7.450)
pO2, Arterial: 63 mmHg — ABNORMAL LOW (ref 83.0–108.0)

## 2019-01-22 LAB — GLUCOSE, CAPILLARY
Glucose-Capillary: 158 mg/dL — ABNORMAL HIGH (ref 70–99)
Glucose-Capillary: 150 mg/dL — ABNORMAL HIGH (ref 70–99)
Glucose-Capillary: 171 mg/dL — ABNORMAL HIGH (ref 70–99)
Glucose-Capillary: 155 mg/dL — ABNORMAL HIGH (ref 70–99)
Glucose-Capillary: 170 mg/dL — ABNORMAL HIGH (ref 70–99)
Glucose-Capillary: 167 mg/dL — ABNORMAL HIGH (ref 70–99)

## 2019-01-22 LAB — BASIC METABOLIC PANEL
Anion gap: 13 (ref 5–15)
BUN: 6 mg/dL (ref 6–20)
CO2: 23 mmol/L (ref 22–32)
Calcium: 8.7 mg/dL — ABNORMAL LOW (ref 8.9–10.3)
Chloride: 105 mmol/L (ref 98–111)
Creatinine, Ser: 0.46 mg/dL (ref 0.44–1.00)
GFR calc Af Amer: >60 mL/min (ref >60)
GFR calc non Af Amer: >60 mL/min (ref >60)
Glucose, Bld: 147 mg/dL — ABNORMAL HIGH (ref 70–99)
Potassium: 3.8 mmol/L (ref 3.5–5.1)
Sodium: 141 mmol/L (ref 135–145)

## 2019-01-22 LAB — CBC
HCT: 25.3 % — ABNORMAL LOW (ref 36.0–46.0)
Hemoglobin: 7.9 g/dL — ABNORMAL LOW (ref 12.0–15.0)
MCH: 27.5 pg (ref 26.0–34.0)
MCHC: 31.2 g/dL (ref 30.0–36.0)
MCV: 88.2 fL (ref 80.0–100.0)
Platelets: 492 10*3/uL — ABNORMAL HIGH (ref 150–400)
RBC: 2.87 MIL/uL — ABNORMAL LOW (ref 3.87–5.11)
RDW: 17.2 % — ABNORMAL HIGH (ref 11.5–15.5)
WBC: 22.9 10*3/uL — ABNORMAL HIGH (ref 4.0–10.5)
nRBC: 4.7 % — ABNORMAL HIGH (ref 0.0–0.2)

## 2019-01-22 LAB — CULTURE, BLOOD (ROUTINE X 2)
Culture: NO GROWTH
Culture: NO GROWTH
Special Requests: ADEQUATE
Special Requests: ADEQUATE

## 2019-01-22 LAB — MAGNESIUM: Magnesium: 2.3 mg/dL (ref 1.7–2.4)

## 2019-01-22 MED ORDER — ATORVASTATIN CALCIUM 10 MG PO TABS
20.0000 mg | ORAL_TABLET | Freq: Every day | ORAL | Status: DC
  Administered 2019-01-22 – 2019-01-25 (×4): 20 mg
  Filled 2019-01-22 (×4): qty 2

## 2019-01-22 MED ORDER — PANTOPRAZOLE SODIUM 40 MG PO PACK
40.0000 mg | PACK | ORAL | Status: DC
  Administered 2019-01-22 – 2019-01-25 (×4): 40 mg
  Filled 2019-01-22 (×4): qty 20

## 2019-01-22 MED ORDER — LEVETIRACETAM 100 MG/ML PO SOLN
2000.0000 mg | Freq: Two times a day (BID) | ORAL | Status: DC
  Administered 2019-01-22 – 2019-01-26 (×9): 2000 mg
  Filled 2019-01-22 (×11): qty 20

## 2019-01-22 MED ORDER — SODIUM CHLORIDE 0.9 % IV SOLN
INTRAVENOUS | Status: DC
  Administered 2019-01-22 – 2019-01-24 (×4): via INTRAVENOUS

## 2019-01-22 NOTE — Progress Notes (Signed)
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  AEDs initiated. 9/11-9/13 Received DDAVP 01/15/2019 -blood pressure augmentation for suspected vasospasm 9/13 DDAVP stopped 9/18 Weaning from Vent 9/19 change to pressure control  STUDIES:   01/09/2019 CT scans revealing a right MCA aneurysm and hematoma 01/13/2019:  ECHO: EF 65%, normal RV, mild MV Regurg 01/15/2019 CT scan shows stable residual hematoma but increased surrounding edema. 9/13 CT head: no additional ICH, suspicion of diffuse intracranial spasm 9/14 Diagnostic Cerebral Angiogram: No significant intracranial vasospasm, perhaps mild vasospasm involving the R M1 segment 9/14 TCD: limited study, normal flow  9/16 TCD: limited study, normal flow  CULTURES:  MRSA>>negative SARS-CoV-2>> negative 01/15/2019: Blood Cx>> 01/13/2019: Respiratory Cx>> Staph Aureus and Enterobacter>>Staph pan-sensitive, Enterobacter resistant to cefazolin 01/18/19: C Diff>>neg  ANTIBIOTICS:  Vanc 9/14>>9/16 Zosyn  9/14>>9/16 Ceftriaxone 9/16>>  LINES/TUBES:  01/11/2019 endotracheal tube>> 01/05/2019; 9/11>> 9/11 CVC L subclavian>> 9/13 Arterial Line>>  CONSULTANTS:  Neurosurgery Neurology  SUBJECTIVE:  Remains on pressors, IV fluid.  CONSTITUTIONAL: BP (!) 187/91   Pulse 92   Temp 99.7 F (37.6 C)   Resp (!) 22   Ht 5\' 5"  (1.651 m)   Wt 88.5 kg   LMP 05/29/2010   SpO2 98%   BMI 32.47 kg/m   I/O last 3 completed shifts: In: 11140.4 [I.V.:6451.2; NG/GT:2955; IV Piggyback:1734.2] Out: 9950 [Urine:8850; Stool:1100]     Vent Mode: PRVC FiO2 (%):  [30 %] 30 % Set Rate:  [18 bmp] 18 bmp Vt Set:  [450 mL] 450 mL PEEP:  [5 cmH20] 5 cmH20 Pressure Support:  [10 cmH20] 10 cmH20 Plateau Pressure:  [15 cmH20] 15 cmH20    General - on vent Eyes - periorbital edema around Rt eye ENT - ETT in place Cardiac - regular, tachycardic Chest - b/l rhonchi Lt > Rt Abdomen - soft, non tender, + bowel sounds Extremities - no cyanosis, clubbing, or edema Skin - no rashes Neuro - doesn't follow commands   RESOLVED PROBLEM LIST  Status Epilepticus, Hyponatremia, Hypernatremia  ASSESSMENT AND PLAN    Acute respiratory failure with compromised airway. Failed extubation attempts. - full vent support - f/u CXR - changed to pressure control 9/19 - likely will need trach to assist with vent weaning  HCAP with MSSA and Enterobacter. - day 6 of ABx, currently on rocephin  SAH from Rt MCA aneurysm s/p clipping complicated by M1 vasospasm. Acute metabolic encephalopathy. Seizures. - goal SBP > 180; continue pressors, IV fluids - continue nimodipine - continue AEDs - add statin  Hyperglycemia. - SSI  Anemia of critical illness. - f/u CBC - transfuse for Hb < 7 or significant bleeding  BEST PRACTICE:  DVT PROPHYLAXIS: heparin SUP: PPI NUTRITION: N.p.o -tube feeds. MOBILITY: Bedrest GOALS OF CARE: Full code DISPOSITION: ICU on mechanical ventilation  LABS   CMP Latest Ref Rng &  Units 01/22/2019 01/21/2019 01/20/2019  Glucose 70 - 99 mg/dL 147(H) 158(H) 141(H)  BUN 6 - 20 mg/dL 6 7 6   Creatinine 0.44 - 1.00 mg/dL 0.46 0.42(L) 0.49  Sodium 135 - 145 mmol/L 141 143 145  Potassium 3.5 - 5.1 mmol/L 3.8 3.7 3.4(L)  Chloride 98 - 111 mmol/L 105 109 109  CO2 22 - 32 mmol/L 23 24 23   Calcium 8.9 - 10.3 mg/dL 8.7(L) 8.4(L) 8.4(L)  Total Protein 6.5 - 8.1 g/dL - - -  Total Bilirubin 0.3 - 1.2 mg/dL - - -  Alkaline Phos 38 - 126 U/L - - -  AST 15 - 41 U/L - - -  ALT 0 - 44 U/L - - -   CBC Latest Ref Rng & Units 01/22/2019 01/21/2019 01/20/2019  WBC 4.0 - 10.5 K/uL 22.9(H) 25.6(H) 23.0(H)  Hemoglobin 12.0 - 15.0 g/dL 7.9(L) 7.8(L) 8.1(L)  Hematocrit 36.0 - 46.0 % 25.3(L) 25.2(L) 25.4(L)  Platelets 150 - 400 K/uL 492(H) 402(H) 296   ABG    Component Value Date/Time   HCO3 26.1 01/14/2019 1806   TCO2 27 01/14/2019 1806   O2SAT 87.0 01/14/2019 1806   CBG (last 3)  Recent Labs    01/21/19 1927 01/21/19 2344 01/22/19 0334  GLUCAP 149* 135* 158*    CC time 33 minutes  Chesley Mires, MD [facility] Pulmonary/Critical Care 01/22/2019, 8:01 AM

## 2019-01-22 NOTE — Progress Notes (Signed)
Neurosurgery Service Progress Note  Subjective: No acute events overnight   Objective: Vitals:   01/22/19 0700 01/22/19 0800 01/22/19 0809 01/22/19 0815  BP: (!) 187/91 (!) 158/82  (!) 171/84  Pulse: 92 (!) 112  (!) 136  Resp: (!) 22 (!) 26  (!) 26  Temp: 99.7 F (37.6 C) 99.5 F (37.5 C)  99.3 F (37.4 C)  TempSrc:  Esophageal    SpO2: 98% 95% 93% 97%  Weight:      Height:       Temp (24hrs), Avg:99.5 F (37.5 C), Min:98.8 F (37.1 C), Max:100.2 F (37.9 C)  CBC Latest Ref Rng & Units 01/22/2019 01/21/2019 01/20/2019  WBC 4.0 - 10.5 K/uL 22.9(H) 25.6(H) 23.0(H)  Hemoglobin 12.0 - 15.0 g/dL 7.9(L) 7.8(L) 8.1(L)  Hematocrit 36.0 - 46.0 % 25.3(L) 25.2(L) 25.4(L)  Platelets 150 - 400 K/uL 492(H) 402(H) 296   BMP Latest Ref Rng & Units 01/22/2019 01/21/2019 01/20/2019  Glucose 70 - 99 mg/dL 147(H) 158(H) 141(H)  BUN 6 - 20 mg/dL 6 7 6   Creatinine 0.44 - 1.00 mg/dL 0.46 0.42(L) 0.49  Sodium 135 - 145 mmol/L 141 143 145  Potassium 3.5 - 5.1 mmol/L 3.8 3.7 3.4(L)  Chloride 98 - 111 mmol/L 105 109 109  CO2 22 - 32 mmol/L 23 24 23   Calcium 8.9 - 10.3 mg/dL 8.7(L) 8.4(L) 8.4(L)    Intake/Output Summary (Last 24 hours) at 01/22/2019 0826 Last data filed at 01/22/2019 0800 Gross per 24 hour  Intake 6642.37 ml  Output 4850 ml  Net 1792.37 ml    Current Facility-Administered Medications:  .  0.9 %  sodium chloride infusion, , Intravenous, Continuous, Sood, Vineet, MD .  acetaminophen (TYLENOL) tablet 650 mg, 650 mg, Oral, Q6H **OR** acetaminophen (TYLENOL) solution 650 mg, 650 mg, Per Tube, Q6H, 650 mg at 01/22/19 0609 **OR** acetaminophen (TYLENOL) suppository 650 mg, 650 mg, Rectal, Q6H, Clark, Laura P, DO .  atorvastatin (LIPITOR) tablet 20 mg, 20 mg, Per Tube, q1800, Sood, Vineet, MD .  betaxolol (BETOPTIC-S) 0.5 % ophthalmic solution 1 drop, 1 drop, Both Eyes, BID, Minor, Grace Bushy, NP, 1 drop at 01/21/19 2126 .  brinzolamide (AZOPT) 1 % ophthalmic suspension 1 drop, 1 drop,  Both Eyes, TID, Minor, Grace Bushy, NP, 1 drop at 01/21/19 2126 .  cefTRIAXone (ROCEPHIN) 2 g in sodium chloride 0.9 % 100 mL IVPB, 2 g, Intravenous, Q24H, Julian Hy, DO, Stopped at 01/21/19 1221 .  chlorhexidine (PERIDEX) 0.12 % solution 15 mL, 15 mL, Mouth/Throat, BID, Consuella Lose, MD, 15 mL at 01/21/19 2000 .  Chlorhexidine Gluconate Cloth 2 % PADS 6 each, 6 each, Topical, Daily, Consuella Lose, MD, 6 each at 01/22/19 0100 .  feeding supplement (VITAL AF 1.2 CAL) liquid 1,000 mL, 1,000 mL, Per Tube, Continuous, Rigoberto Noel, MD, Last Rate: 60 mL/hr at 01/22/19 0600 .  fentaNYL (SUBLIMAZE) injection 25 mcg, 25 mcg, Intravenous, Q1H PRN, Kipp Brood, MD, 25 mcg at 01/22/19 0612 .  free water 200 mL, 200 mL, Per Tube, Q8H, Hicks, Samantha B, NP, 200 mL at 01/22/19 0600 .  heparin injection 5,000 Units, 5,000 Units, Subcutaneous, Q8H, Hicks, Samantha B, NP, 5,000 Units at 01/22/19 0612 .  influenza vac split quadrivalent PF (FLUARIX) injection 0.5 mL, 0.5 mL, Intramuscular, Tomorrow-1000, Nundkumar, Neelesh, MD .  insulin aspart (novoLOG) injection 0-15 Units, 0-15 Units, Subcutaneous, Q4H, Elsie Lincoln, MD, 2 Units at 01/22/19 0750 .  insulin detemir (LEVEMIR) injection 15 Units, 15 Units, Subcutaneous, BID, Noemi Chapel  P, DO, 15 Units at 01/21/19 2122 .  latanoprost (XALATAN) 0.005 % ophthalmic solution 1 drop, 1 drop, Both Eyes, QHS, Minor, Grace Bushy, NP, 1 drop at 01/21/19 2127 .  levETIRAcetam (KEPPRA) 100 MG/ML solution 2,000 mg, 2,000 mg, Per Tube, Q12H, Sood, Vineet, MD .  loperamide HCl (IMODIUM) 1 MG/7.5ML suspension 2 mg, 2 mg, Per Tube, TID PRN, Rigoberto Noel, MD, 2 mg at 01/21/19 2124 .  MEDLINE mouth rinse, 15 mL, Mouth Rinse, 10 times per day, Agarwala, Ravi, MD, 15 mL at 01/22/19 0600 .  niMODipine (NYMALIZE) 6 MG/ML oral solution 30 mg, 30 mg, Per Tube, Q2H, Consuella Lose, MD, 30 mg at 01/22/19 0745 .  norepinephrine (LEVOPHED) 16 mg in 284mL premix infusion,  0-40 mcg/min, Intravenous, Titrated, Consuella Lose, MD, Last Rate: 35.6 mL/hr at 01/22/19 0700, 38 mcg/min at 01/22/19 0700 .  pantoprazole sodium (PROTONIX) 40 mg/20 mL oral suspension 40 mg, 40 mg, Per Tube, Q24H, Sood, Vineet, MD .  phenylephrine (NEOSYNEPHRINE) 40-0.9 MG/250ML-% infusion, 25-200 mcg/min, Intravenous, Titrated, Consuella Lose, MD, Last Rate: 75 mL/hr at 01/22/19 0745, 200 mcg/min at 01/22/19 0745 .  phenytoin (DILANTIN) 130 mg in sodium chloride 0.9 % 100 mL IVPB, 130 mg, Intravenous, BID, Stopped at 01/21/19 2222 **AND** phenytoin (DILANTIN) injection 100 mg, 100 mg, Intravenous, Q0600, Richardine Service, RPH, 100 mg at 01/22/19 [id number] .  sennosides (SENOKOT) 8.8 MG/5ML syrup 5 mL, 5 mL, Per Tube, BID, Earnie Larsson, MD, 5 mL at 01/16/19 2155 .  sodium chloride flush (NS) 0.9 % injection 10-40 mL, 10-40 mL, Intracatheter, PRN, Consuella Lose, MD   Physical Exam: Eyes open spontaneously, PERRL, was able to wiggle toes to command in RLE, less reliable in RUE, 0/5 on L  Assessment & Plan: 58 y.o. man w/ ruptured R MCA aneurysm s/p clipping, c/c/b vasospasms, SE  -cont SBP goal > 180 -remainder of care per CCM  Thomas A Ostergard  01/22/19 8:26 AM

## 2019-01-22 NOTE — Progress Notes (Signed)
Assisted tele visit to patient with family member.  Smita Lesh M, RN  

## 2019-01-23 ENCOUNTER — Inpatient Hospital Stay (HOSPITAL_COMMUNITY): Payer: Medicaid Other

## 2019-01-23 ENCOUNTER — Inpatient Hospital Stay: Payer: Self-pay

## 2019-01-23 LAB — COMPREHENSIVE METABOLIC PANEL
ALT: 199 U/L — ABNORMAL HIGH (ref 0–44)
AST: 258 U/L — ABNORMAL HIGH (ref 15–41)
Albumin: 1.8 g/dL — ABNORMAL LOW (ref 3.5–5.0)
Alkaline Phosphatase: 372 U/L — ABNORMAL HIGH (ref 38–126)
Anion gap: 10 (ref 5–15)
BUN: 6 mg/dL (ref 6–20)
CO2: 22 mmol/L (ref 22–32)
Calcium: 7 mg/dL — ABNORMAL LOW (ref 8.9–10.3)
Chloride: 111 mmol/L (ref 98–111)
Creatinine, Ser: 0.32 mg/dL — ABNORMAL LOW (ref 0.44–1.00)
GFR calc Af Amer: >60 mL/min (ref >60)
GFR calc non Af Amer: >60 mL/min (ref >60)
Glucose, Bld: 150 mg/dL — ABNORMAL HIGH (ref 70–99)
Potassium: 3.7 mmol/L (ref 3.5–5.1)
Sodium: 143 mmol/L (ref 135–145)
Total Bilirubin: 1 mg/dL (ref 0.3–1.2)
Total Protein: 5.9 g/dL — ABNORMAL LOW (ref 6.5–8.1)

## 2019-01-23 LAB — CBC
HCT: 30.1 % — ABNORMAL LOW (ref 36.0–46.0)
Hemoglobin: 9.4 g/dL — ABNORMAL LOW (ref 12.0–15.0)
MCH: 26.6 pg (ref 26.0–34.0)
MCHC: 31.2 g/dL (ref 30.0–36.0)
MCV: 85.3 fL (ref 80.0–100.0)
Platelets: 585 10*3/uL — ABNORMAL HIGH (ref 150–400)
RBC: 3.53 MIL/uL — ABNORMAL LOW (ref 3.87–5.11)
RDW: 18.6 % — ABNORMAL HIGH (ref 11.5–15.5)
WBC: 18.3 10*3/uL — ABNORMAL HIGH (ref 4.0–10.5)
nRBC: 8 % — ABNORMAL HIGH (ref 0.0–0.2)

## 2019-01-23 LAB — GLUCOSE, CAPILLARY
Glucose-Capillary: 145 mg/dL — ABNORMAL HIGH (ref 70–99)
Glucose-Capillary: 190 mg/dL — ABNORMAL HIGH (ref 70–99)
Glucose-Capillary: 173 mg/dL — ABNORMAL HIGH (ref 70–99)
Glucose-Capillary: 150 mg/dL — ABNORMAL HIGH (ref 70–99)
Glucose-Capillary: 155 mg/dL — ABNORMAL HIGH (ref 70–99)

## 2019-01-23 LAB — TROPONIN I (HIGH SENSITIVITY): Troponin I (High Sensitivity): 52 ng/L — ABNORMAL HIGH (ref <18)

## 2019-01-23 NOTE — Progress Notes (Signed)
Received order for PICC.  Plan to place tomorrow in am.

## 2019-01-23 NOTE — Progress Notes (Signed)
Central line leaking slightly from underneath the dressing. Md paged. Chest x ray obtained showing no change in location. MD made aware of results. Able to continue using line today and PICC to be placed 01/23/19. Dressing changed. Will continue to monitor

## 2019-01-23 NOTE — Plan of Care (Signed)
Patient on tube feeds, RN turning patient q2h and assessing skin, patient's bowels are moving freely.

## 2019-01-23 NOTE — Progress Notes (Signed)
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  AEDs initiated. 9/11-9/13 Received DDAVP 01/15/2019 -blood pressure augmentation for suspected vasospasm 9/13 DDAVP stopped 9/18 Weaning from Vent 9/19 change to pressure control. Remains on pressors, IV fluid.  STUDIES:   01/17/2019 CT scans revealing a right MCA aneurysm and hematoma 01/13/2019:  ECHO: EF 65%, normal RV, mild MV Regurg 01/15/2019 CT scan shows stable residual hematoma but increased surrounding edema. 9/13 CT head: no additional ICH, suspicion of diffuse intracranial spasm 9/14 Diagnostic Cerebral Angiogram: No significant intracranial vasospasm, perhaps mild vasospasm involving the R M1 segment 9/14 TCD: limited study, normal flow  9/16 TCD: limited study, normal flow  CULTURES:  MRSA>>negative SARS-CoV-2>> negative 01/28/2019: Blood Cx>> 01/08/2019: Respiratory Cx>> Staph Aureus and Enterobacter>>Staph pan-sensitive, Enterobacter resistant to cefazolin 01/18/19: C  Diff>>neg  ANTIBIOTICS:  Vanc 9/14>>9/16 Zosyn 9/14>>9/16 Ceftriaxone 9/16>>  LINES/TUBES:  01/18/2019 endotracheal tube>> 01/30/2019; 9/11>> 9/11 CVC L subclavian>> 9/13 Arterial Line>>  CONSULTANTS:  Neurosurgery Neurology  SUBJECTIVE:   9/20 - on vent 40% fio2 with levophed and neo gtt pressors for sbp goal > 180. Neuro not recommending 3rd pressor but instead to lowre sbp  > 160. Neuro ordering echo to rule out Takotsubo   CONSTITUTIONAL: BP (!) 179/95   Pulse 100   Temp 99.5 F (37.5 C)   Resp (!) 24   Ht 5\' 5"  (1.651 m)   Wt 88.5 kg   LMP 05/29/2010   SpO2 100%   BMI 32.47 kg/m   I/O last 3 completed shifts: In: 9500 [I.V.:6554.8; NG/GT:2410; IV Piggyback:535.2] Out: BW:089673; Stool:700]     Vent Mode: PCV FiO2 (%):  [30 %] 30 % Set Rate:  [18 bmp] 18 bmp PEEP:  [5 cmH20] 5 cmH20 Pressure Support:  [10 cmH20] 10 cmH20 Plateau Pressure:  [17 cmH20-22 cmH20] 17 cmH20      General Appearance:  Looks criticall ill OBESE - + Head:  Normocephalic, without obvious abnormality, atraumatic Eyes:  PERRL - + per nsgy, conjunctiva/corneas - muddy     Ears:  Normal external ear canals, both ears Nose:  G tube - no Throat:  ETT TUBE - yes , OG tube - yes Neck:  Supple,  No enlargement/tenderness/nodules Lungs: Coarse breath sounds + Ventilator   Synchrony - yes Heart:  S1 and S2 normal, no murmur, CVP - no.  Pressors - yes x 2 Abdomen:  Soft, no masses, no organomegaly Genitalia / Rectal:  Not done Extremities:  Extremities- intact Skin:  ntact in exposed areas . Sacral area - not examioned Neurologic:  Sedation - none -> RASS - -4       RESOLVED PROBLEM LIST  Status Epilepticus, Hyponatremia, Hypernatremia  ASSESSMENT AND PLAN    Acute respiratory failure with compromised airway. Failed extubation attempts.  01/23/2019 - > does not meet criteria for SBT/Extubation in setting of Acute Respiratory Failure due to coma and shock. On PCV since  01/22/2019  plan - full vent support; PCV since 01/22/2019 to continue - f/u CXR - likely will need trach to assist with vent weaning  HCAP with MSSA and Enterobacter. - day 7 of ABx, currently on rocephin  SAH from Rt MCA aneurysm s/p clipping complicated by M1 vasospasm. Acute metabolic encephalopathy. Seizures. - goal SBP > 180; continue pressors, IV fluids - if 3rd pressor needed - change to sbp goal > 160 - continue nimodipine - continue AEDs - add statin  Hyperglycemia. - SSI  Anemia of critical illness. - f/u CBC - transfuse for Hb < 7 or significant bleeding   Rule out cardiomyopathy  - await echo  BEST PRACTICE:  DVT PROPHYLAXIS: heparin SUP: PPI NUTRITION: N.p.o -tube feeds. MOBILITY: Bedrest GOALS OF CARE: Full code DISPOSITION: ICU on mechanical ventilation Family: per primary service  ATTESTATION & SIGNATURE   The patient Jessica Mayer is critically ill with multiple organ systems failure and requires high complexity decision making for assessment and support, frequent evaluation and titration of therapies, application of advanced monitoring technologies and extensive interpretation of multiple databases.   Critical Care Time devoted to patient care services described in this note is  30  Minutes. This time reflects time of care of this signee Dr Brand Males. This critical care time does not reflect procedure time, or teaching time or supervisory time of PA/NP/Med student/Med Resident etc but could involve care discussion time     Dr. Brand Males, M.D., [facility].C.P Pulmonary and Critical Care Medicine Staff Physician [facility] Pager: [phone], If no answer or between  15:00h - 7:00h: call [phone]  01/23/2019 10:46 AM     LABS    PULMONARY Recent Labs  Lab 01/22/19 1029  PHART 7.471*  PCO2ART 36.1  PO2ART 63.0*  HCO3 26.2  TCO2 27  O2SAT 93.0    CBC Recent Labs   Lab 01/21/19 0500 01/22/19 0548 01/22/19 1029 01/23/19 0535  HGB 7.8* 7.9* 12.2 9.4*  HCT 25.2* 25.3* 36.0 30.1*  WBC 25.6* 22.9*  --  18.3*  PLT 402* 492*  --  585*    COAGULATION No results for input(s): INR in the last 168 hours.  CARDIAC  No results for input(s): TROPONINI in the last 168 hours. No results for input(s): PROBNP in the last 168 hours.   CHEMISTRY Recent Labs  Lab 01/18/19 0347 01/19/19 0401 01/20/19 0945 01/21/19 0500 01/22/19 0548 01/22/19 1029  NA 149* 148* 145 143 141 143  K 3.2* 3.3* 3.4* 3.7 3.8 3.7  CL 114* 112* 109 109 105  --   CO2 23 22 23 24 23   --   GLUCOSE 122* 145* 141* 158* 147*  --   BUN 9 7 6 7 6   --   CREATININE 0.61 0.55 0.49 0.42* 0.46  --   CALCIUM 8.3* 8.1* 8.4* 8.4* 8.7*  --   MG 1.8 2.2 2.2  --  2.3  --   PHOS 2.0* 2.5 2.8  --   --   --    Estimated Creatinine Clearance: 82.2 mL/min (by C-G formula based on SCr of 0.46 mg/dL).   LIVER Recent Labs  Lab 01/15/2019 [phone]  01/18/19 0347 01/21/19 0643  AST 25  --   --   ALT 29  --   --   ALKPHOS 70  --   --   BILITOT 0.6  --   --   PROT 6.2*  --   --   ALBUMIN 2.2* 2.1* 2.1*     INFECTIOUS No results for input(s): LATICACIDVEN, PROCALCITON in the last 168 hours.   ENDOCRINE CBG (last 3)  Recent Labs    01/22/19 2308 01/23/19 0317 01/23/19 0721  GLUCAP 167* 145* 190*         IMAGING x48h  - image(s) personally visualized  -   highlighted in bold Dg Chest Port 1 View  Result Date: 01/23/2019 CLINICAL DATA:  Respiratory failure. EXAM: PORTABLE CHEST 1 VIEW COMPARISON:  01/19/2019. FINDINGS: Lung base opacities have improved.  Lungs now clear. Endotracheal tube, enteric tube and left subclavian central venous line are stable. No pneumothorax. IMPRESSION: 1. Resolved basilar atelectasis. 2. No acute cardiopulmonary disease. 3. Stable support apparatus. Electronically Signed   By: Lajean Manes M.D.   On: 01/23/2019 07:37   Vas [place] Transcranial  Doppler  Result Date: 01/21/2019  Transcranial Doppler Indications: Subarachnoid hemorrhage. Limitations for diagnostic windows: Unable to insonate right transtemporal window. Unable to insonate left transtemporal window. Performing Technologist: Maudry Mayhew MHA, RDMS, RVT, RDCS  Examination Guidelines: A complete evaluation includes B-mode imaging, spectral Doppler, color Doppler, and power Doppler as needed of all accessible portions of each vessel. Bilateral testing is considered an integral part of a complete examination. Limited examinations for reoccurring indications may be performed as noted.  +----------+-------------+----------+-----------+-------+ RIGHT TCD Right VM (cm)Depth (cm)PulsatilityComment +----------+-------------+----------+-----------+-------+ Opthalmic     22.00                 1.18            +----------+-------------+----------+-----------+-------+ ICA siphon    42.00                 1.49            +----------+-------------+----------+-----------+-------+ Vertebral    -56.00                 1.44            +----------+-------------+----------+-----------+-------+  +----------+------------+----------+-----------+------------------+ LEFT TCD  Left VM (cm)Depth (cm)Pulsatility     Comment       +----------+------------+----------+-----------+------------------+ Opthalmic    21.00                 1.36                       +----------+------------+----------+-----------+------------------+ ICA siphon   18.00                 1.58                       +----------+------------+----------+-----------+------------------+ Vertebral                                  Unable to insonate +----------+------------+----------+-----------+------------------+     Preliminary

## 2019-01-23 NOTE — Progress Notes (Signed)
RN spoke with Dr. Zada Finders regarding pt SBP not being at goal at times. Dr. Zada Finders gave orders not to start a 3rd pressor at this time and to send a troponin level. Will continue to monitor

## 2019-01-23 NOTE — Progress Notes (Signed)
Neurosurgery Service Progress Note  Subjective: No acute events overnight, SBP requiring more support  Objective: Vitals:   01/23/19 0730 01/23/19 0745 01/23/19 0800 01/23/19 0815  BP: (!) 174/103 (!) 194/113 (!) 204/100 (!) 189/170  Pulse: (!) 104 94 (!) 103 (!) 127  Resp: (!) 24 20 (!) 24 (!) 26  Temp: 99.5 F (37.5 C) 99.5 F (37.5 C) 99.5 F (37.5 C) 99.5 F (37.5 C)  TempSrc:   Esophageal   SpO2: 100% 100% 100% 100%  Weight:      Height:       Temp (24hrs), Avg:99.8 F (37.7 C), Min:99.3 F (37.4 C), Max:100.6 F (38.1 C)  CBC Latest Ref Rng & Units 01/23/2019 01/22/2019 01/22/2019  WBC 4.0 - 10.5 K/uL 18.3(H) - 22.9(H)  Hemoglobin 12.0 - 15.0 g/dL 9.4(L) 12.2 7.9(L)  Hematocrit 36.0 - 46.0 % 30.1(L) 36.0 25.3(L)  Platelets 150 - 400 K/uL 585(H) - 492(H)   BMP Latest Ref Rng & Units 01/22/2019 01/22/2019 01/21/2019  Glucose 70 - 99 mg/dL - 147(H) 158(H)  BUN 6 - 20 mg/dL - 6 7  Creatinine 0.44 - 1.00 mg/dL - 0.46 0.42(L)  Sodium 135 - 145 mmol/L 143 141 143  Potassium 3.5 - 5.1 mmol/L 3.7 3.8 3.7  Chloride 98 - 111 mmol/L - 105 109  CO2 22 - 32 mmol/L - 23 24  Calcium 8.9 - 10.3 mg/dL - 8.7(L) 8.4(L)    Intake/Output Summary (Last 24 hours) at 01/23/2019 0847 Last data filed at 01/23/2019 0800 Gross per 24 hour  Intake 6082.27 ml  Output 5975 ml  Net 107.27 ml    Current Facility-Administered Medications:  .  0.9 %  sodium chloride infusion, , Intravenous, Continuous, Sood, Vineet, MD, Last Rate: 75 mL/hr at 01/22/19 2336 .  acetaminophen (TYLENOL) tablet 650 mg, 650 mg, Oral, Q6H **OR** acetaminophen (TYLENOL) solution 650 mg, 650 mg, Per Tube, Q6H, 650 mg at 01/23/19 0557 **OR** acetaminophen (TYLENOL) suppository 650 mg, 650 mg, Rectal, Q6H, Clark, Laura P, DO .  atorvastatin (LIPITOR) tablet 20 mg, 20 mg, Per Tube, q1800, Chesley Mires, MD, 20 mg at 01/22/19 1753 .  betaxolol (BETOPTIC-S) 0.5 % ophthalmic solution 1 drop, 1 drop, Both Eyes, BID, Minor, Grace Bushy, NP, 1 drop at 01/22/19 2123 .  brinzolamide (AZOPT) 1 % ophthalmic suspension 1 drop, 1 drop, Both Eyes, TID, Minor, Grace Bushy, NP, 1 drop at 01/22/19 2124 .  cefTRIAXone (ROCEPHIN) 2 g in sodium chloride 0.9 % 100 mL IVPB, 2 g, Intravenous, Q24H, Julian Hy, DO, Last Rate: 200 mL/hr at 01/22/19 1208, 2 g at 01/22/19 1208 .  chlorhexidine (PERIDEX) 0.12 % solution 15 mL, 15 mL, Mouth/Throat, BID, Consuella Lose, MD, 15 mL at 01/23/19 0818 .  Chlorhexidine Gluconate Cloth 2 % PADS 6 each, 6 each, Topical, Daily, Consuella Lose, MD, 6 each at 01/22/19 2134 .  feeding supplement (VITAL AF 1.2 CAL) liquid 1,000 mL, 1,000 mL, Per Tube, Continuous, Rigoberto Noel, MD, Last Rate: 60 mL/hr at 01/22/19 1900 .  fentaNYL (SUBLIMAZE) injection 25 mcg, 25 mcg, Intravenous, Q1H PRN, Kipp Brood, MD, 25 mcg at 01/22/19 0612 .  free water 200 mL, 200 mL, Per Tube, Q8H, Hicks, Samantha B, NP, 200 mL at 01/22/19 2134 .  heparin injection 5,000 Units, 5,000 Units, Subcutaneous, Q8H, Hicks, Samantha B, NP, 5,000 Units at 01/23/19 0557 .  influenza vac split quadrivalent PF (FLUARIX) injection 0.5 mL, 0.5 mL, Intramuscular, Tomorrow-1000, Nundkumar, Neelesh, MD .  insulin aspart (novoLOG) injection  0-15 Units, 0-15 Units, Subcutaneous, Q4H, Elsie Lincoln, MD, 3 Units at 01/23/19 0813 .  insulin detemir (LEVEMIR) injection 15 Units, 15 Units, Subcutaneous, BID, Julian Hy, DO, 15 Units at 01/22/19 2123 .  latanoprost (XALATAN) 0.005 % ophthalmic solution 1 drop, 1 drop, Both Eyes, QHS, Minor, Grace Bushy, NP, 1 drop at 01/22/19 2124 .  levETIRAcetam (KEPPRA) 100 MG/ML solution 2,000 mg, 2,000 mg, Per Tube, Q12H, Chesley Mires, MD, 2,000 mg at 01/22/19 2127 .  loperamide HCl (IMODIUM) 1 MG/7.5ML suspension 2 mg, 2 mg, Per Tube, TID PRN, Rigoberto Noel, MD, 2 mg at 01/21/19 2124 .  MEDLINE mouth rinse, 15 mL, Mouth Rinse, 10 times per day, Agarwala, Einar Grad, MD, 15 mL at 01/23/19 0558 .  niMODipine (NYMALIZE)  6 MG/ML oral solution 30 mg, 30 mg, Per Tube, Q2H, Consuella Lose, MD, 30 mg at 01/23/19 0814 .  norepinephrine (LEVOPHED) 16 mg in 22mL premix infusion, 0-40 mcg/min, Intravenous, Titrated, Consuella Lose, MD, Last Rate: 35.6 mL/hr at 01/23/19 0818, 38 mcg/min at 01/23/19 0818 .  pantoprazole sodium (PROTONIX) 40 mg/20 mL oral suspension 40 mg, 40 mg, Per Tube, Q24H, Sood, Vineet, MD, 40 mg at 01/22/19 1004 .  phenylephrine (NEOSYNEPHRINE) 40-0.9 MG/250ML-% infusion, 25-200 mcg/min, Intravenous, Titrated, Consuella Lose, MD, Last Rate: 75 mL/hr at 01/23/19 0600, 200 mcg/min at 01/23/19 0600 .  phenytoin (DILANTIN) 130 mg in sodium chloride 0.9 % 100 mL IVPB, 130 mg, Intravenous, BID, Stopped at 01/22/19 2223 **AND** phenytoin (DILANTIN) injection 100 mg, 100 mg, Intravenous, Q0600, Richardine Service, RPH, 100 mg at 01/23/19 0557 .  sennosides (SENOKOT) 8.8 MG/5ML syrup 5 mL, 5 mL, Per Tube, BID, Earnie Larsson, MD, 5 mL at 01/22/19 2122 .  sodium chloride flush (NS) 0.9 % injection 10-40 mL, 10-40 mL, Intracatheter, PRN, Consuella Lose, MD   Physical Exam: Eyes open spontaneously w/ R scalp and periorbital edema, PERRL, FC on R, 0/5 on L with increased tone  Assessment & Plan: 58 y.o. man w/ ruptured R MCA aneurysm s/p clipping, c/c/b vasospasms, SE  -cont SBP goal > 180, do not recommend starting a 3rd pressor. If unable to maintain on two max pressors, can try SBP goal > 160 and follow exam to see if she is neurologically stable -will check troponin to evaluate for Takotsubo's - pt is high grade, younger, and female - high risk category -remainder of care per [place]  01/23/19 8:47 AM

## 2019-01-24 ENCOUNTER — Inpatient Hospital Stay (HOSPITAL_COMMUNITY): Payer: Medicaid Other

## 2019-01-24 DIAGNOSIS — I609 Nontraumatic subarachnoid hemorrhage, unspecified: Secondary | ICD-10-CM

## 2019-01-24 LAB — CBC WITH DIFFERENTIAL/PLATELET
Abs Immature Granulocytes: 0.33 10*3/uL — ABNORMAL HIGH (ref 0.00–0.07)
Basophils Absolute: 0 10*3/uL (ref 0.0–0.1)
Basophils Relative: 0 %
Eosinophils Absolute: 0.2 10*3/uL (ref 0.0–0.5)
Eosinophils Relative: 1 %
HCT: 26 % — ABNORMAL LOW (ref 36.0–46.0)
Hemoglobin: 8 g/dL — ABNORMAL LOW (ref 12.0–15.0)
Immature Granulocytes: 2 %
Lymphocytes Relative: 13 %
Lymphs Abs: 2.1 10*3/uL (ref 0.7–4.0)
MCH: 26.9 pg (ref 26.0–34.0)
MCHC: 30.8 g/dL (ref 30.0–36.0)
MCV: 87.5 fL (ref 80.0–100.0)
Monocytes Absolute: 0.9 10*3/uL (ref 0.1–1.0)
Monocytes Relative: 5 %
Neutro Abs: 13.2 10*3/uL — ABNORMAL HIGH (ref 1.7–7.7)
Neutrophils Relative %: 79 %
Platelets: 588 10*3/uL — ABNORMAL HIGH (ref 150–400)
RBC: 2.97 MIL/uL — ABNORMAL LOW (ref 3.87–5.11)
RDW: 17.8 % — ABNORMAL HIGH (ref 11.5–15.5)
WBC: 16.7 10*3/uL — ABNORMAL HIGH (ref 4.0–10.5)
nRBC: 3.5 % — ABNORMAL HIGH (ref 0.0–0.2)

## 2019-01-24 LAB — BASIC METABOLIC PANEL
Anion gap: 8 (ref 5–15)
BUN: 16 mg/dL (ref 6–20)
CO2: 23 mmol/L (ref 22–32)
Calcium: 8.4 mg/dL — ABNORMAL LOW (ref 8.9–10.3)
Chloride: 129 mmol/L — ABNORMAL HIGH (ref 98–111)
Creatinine, Ser: 0.96 mg/dL (ref 0.44–1.00)
GFR calc Af Amer: >60 mL/min (ref >60)
GFR calc non Af Amer: >60 mL/min (ref >60)
Glucose, Bld: 178 mg/dL — ABNORMAL HIGH (ref 70–99)
Potassium: 3.2 mmol/L — ABNORMAL LOW (ref 3.5–5.1)
Sodium: 160 mmol/L — ABNORMAL HIGH (ref 135–145)

## 2019-01-24 LAB — PROTIME-INR
INR: 1.1 (ref 0.8–1.2)
Prothrombin Time: 14.2 seconds (ref 11.4–15.2)

## 2019-01-24 LAB — GLUCOSE, CAPILLARY
Glucose-Capillary: 145 mg/dL — ABNORMAL HIGH (ref 70–99)
Glucose-Capillary: 155 mg/dL — ABNORMAL HIGH (ref 70–99)
Glucose-Capillary: 160 mg/dL — ABNORMAL HIGH (ref 70–99)
Glucose-Capillary: 163 mg/dL — ABNORMAL HIGH (ref 70–99)
Glucose-Capillary: 176 mg/dL — ABNORMAL HIGH (ref 70–99)
Glucose-Capillary: 249 mg/dL — ABNORMAL HIGH (ref 70–99)
Glucose-Capillary: 83 mg/dL (ref 70–99)

## 2019-01-24 LAB — HEPATIC FUNCTION PANEL
ALT: 259 U/L — ABNORMAL HIGH (ref 0–44)
AST: 330 U/L — ABNORMAL HIGH (ref 15–41)
Albumin: 2.3 g/dL — ABNORMAL LOW (ref 3.5–5.0)
Alkaline Phosphatase: 434 U/L — ABNORMAL HIGH (ref 38–126)
Bilirubin, Direct: 0.4 mg/dL — ABNORMAL HIGH (ref 0.0–0.2)
Indirect Bilirubin: 0.7 mg/dL (ref 0.3–0.9)
Total Bilirubin: 1.1 mg/dL (ref 0.3–1.2)
Total Protein: 7 g/dL (ref 6.5–8.1)

## 2019-01-24 LAB — PHENYTOIN LEVEL, TOTAL: Phenytoin Lvl: 2.8 ug/mL — ABNORMAL LOW (ref 10.0–20.0)

## 2019-01-24 LAB — MAGNESIUM: Magnesium: 2.3 mg/dL (ref 1.7–2.4)

## 2019-01-24 LAB — PHOSPHORUS: Phosphorus: 3.6 mg/dL (ref 2.5–4.6)

## 2019-01-24 LAB — LACTIC ACID, PLASMA: Lactic Acid, Venous: 1.7 mmol/L (ref 0.5–1.9)

## 2019-01-24 LAB — PROCALCITONIN: Procalcitonin: 0.84 ng/mL

## 2019-01-24 MED ORDER — ALBUMIN HUMAN 5 % IV SOLN: 12.5000 g | Freq: Once | INTRAVENOUS | Status: DC

## 2019-01-24 MED ORDER — ALBUMIN HUMAN 5 % IV SOLN
12.5000 g | INTRAVENOUS | Status: AC
  Administered 2019-01-24: 12.5 g via INTRAVENOUS
  Filled 2019-01-24: qty 250

## 2019-01-24 MED ORDER — GADOBUTROL 1 MMOL/ML IV SOLN
8.0000 mL | Freq: Once | INTRAVENOUS | Status: AC | PRN
  Administered 2019-01-24: 8 mL via INTRAVENOUS

## 2019-01-24 MED ORDER — SODIUM CHLORIDE 0.9% FLUSH: 10.0000 mL | INTRAVENOUS | Status: DC | PRN

## 2019-01-24 MED ORDER — SODIUM CHLORIDE 0.45 % IV SOLN
INTRAVENOUS | Status: DC
  Administered 2019-01-24 – 2019-01-26 (×3): via INTRAVENOUS

## 2019-01-24 MED ORDER — PHENYTOIN 125 MG/5ML PO SUSP
150.0000 mg | Freq: Three times a day (TID) | ORAL | Status: DC
  Administered 2019-01-24 – 2019-01-26 (×7): 150 mg
  Filled 2019-01-24 (×9): qty 8

## 2019-01-24 MED ORDER — SODIUM CHLORIDE 0.9% FLUSH
10.0000 mL | Freq: Two times a day (BID) | INTRAVENOUS | Status: DC
  Administered 2019-01-24 – 2019-01-26 (×5): 10 mL

## 2019-01-24 NOTE — Progress Notes (Signed)
MRI reviewed, demonstrating significant posterior fossa edema, effacement of the fourth ventricle, brainstem compression, and diffuse cerebral T2 hyperintensity including basal ganglia. This picture is c/w global metabolic or toxic/ischemic event although the etiology remains somewhat unclear. Her clinical picture is also c/w severe brainstem dysfunction with minimal reflexes present. I therefore think her prognosis for meaningful recovery is likely very poor.   I did update the patient's husband this morning regarding her acute change last night and the plan for MRI today. I attempted to update him again after the MRI this afternoon by phone with no response. I will attempt to speak with him again in the am. In the interim we will cont present supportive care.

## 2019-01-24 NOTE — Progress Notes (Signed)
MEDICATION RELATED CONSULT NOTE - FOLLOW UP  Pharmacy Consult for Phenytoin Indication: seizures  Allergies  Allergen Reactions  . Asa Buff (Mag [Buffered Aspirin] Nausea And Vomiting    Patient Measurements: Height: 5\' 5"  (165.1 cm) Weight: 195 lb 1.7 oz (88.5 kg) IBW/kg (Calculated) : 57  Adjusted body weight: 68.6 kg  Vital Signs: Temp: 96.8 F (36 C) (09/21 0730) Temp Source: Esophageal (09/21 0400) BP: 131/86 (09/21 0730) Pulse Rate: 124 (09/21 0730) Intake/Output from previous day: 09/20 0701 - 09/21 0700 In: 7072.4 [I.V.:5386.4; NG/GT:1380; IV Piggyback:306] Out: 6000 [Urine:5350; Stool:650] Intake/Output from this shift: No intake/output data recorded.  Estimated Creatinine Clearance: 82.2 mL/min (A) (by C-G formula based on SCr of 0.32 mg/dL (L)).    Assessment: 58 YO female presenting with AMS found to have [place] s/p MCA aneurysm. EEG on 9/12 showed 10 seizures from R frontocentral region with new epileptogenicity in L frontal region. Patient was started on phenytoin 100 mg Q8h on 9/11. Pharmacy consulted to assist with maintenance dosing of phenytoin.  Repeat phenytoin level is still undetectable at 2.8 mcg/mL after increasing total daily dose to 360 mg on 9/18. Albumin is low today at 2.3. Renal function remains stable.  Per conversation with neuro on 9/16, the plan was not to be aggressive with phenytoin dosing as long as level > 10 mcg/mL since she is not having any more seizures. However, will increase dose today since level was low today and re-check level at steady state.  Drug Interactions with Phenytoin:  Phenytoin is a strong CYP3A4 inducer and nimodipine is extensively metabolized by CYP3A4. This interaction causes markedly reduced nimodipine levels.  - Patient is currently receiving nimodipine Q2h instead of Q4h. Will continue to monitor.  Goal of Therapy:  Phenytoin level 10-20 mcg/mL  Plan:  - Increase phenytoin to a total daily dose of 450 mg per  tube (150 mg every 8 hours) - Check phenytoin trough and albumin in 5-7 days - Monitor renal function, LFTs, albumin - Monitor phenytoin-nimodipine DDI  [facility], Student-PharmD 01/24/2019 8:19 AM  Please check [email or web address] for unit-specific pharmacy phone numbers.

## 2019-01-24 NOTE — Progress Notes (Signed)
After patient's bath, pt became unresponsive; RN checked pupils both 4 and non-reactive, patient no longer withdrew from pain nor following commands. Notified physician and ordered/took patient to stat head CT. Will continue to monitor.

## 2019-01-24 NOTE — Progress Notes (Signed)
Pt transported from 4N24 to MRI and back without incident.

## 2019-01-24 NOTE — Progress Notes (Signed)
Transcranial Doppler  Date POD PCO2 HCT BP  MCA ACA PCA OPHT SIPH VERT Basilar  9/11 JP     Right  Left   *  *   *  *   *  *   33  36   48  *   *  *   -35      9/13 MR     Right  Left   43  *   *  *   *  *   25  17   37  51   *  *   *      9/14 JP     Right  Left   *  *   *  *   *  *   30  24   52  76   *  *   *      9/16 MR      Right  Left   *  *   *  *   *  *   40  12   22  44   *  *   *      9/18 MS      Right  Left   *  *   *  *   *  *   22  21   42  18   -56  *   *      9/21 GC     Right  Left   *  *   *  *   *  *   13  16   11  22    -5  -9   -7           Right  Left                                        MCA = Middle Cerebral Artery      OPHT = Opthalmic Artery     BASILAR = Basilar Artery   ACA = Anterior Cerebral Artery     SIPH = Carotid Siphon PCA = Posterior Cerebral Artery   VERT = Verterbral Artery                   Normal MCA = 62+\-12 ACA = 50+\-12 PCA = 42+\-23   * = not insonated/ visualized.   01/24/2019 4:52 PM Maudry Mayhew, MHA, RVT, RDCS, RDMS

## 2019-01-24 NOTE — Progress Notes (Signed)
  NEUROSURGERY PROGRESS NOTE   Pt seen and examined. Events of last night reviewed in EMR and with RN. Pt apparently became unresponsive late last pm, with non-reactive pupils and no motor responses. CT obtained.  EXAM: Temp:  [96.3 F (35.7 C)-100 F (37.8 C)] 96.8 F (36 C) (09/21 0730) Pulse Rate:  [92-147] 128 (09/21 0848) Resp:  [15-29] 18 (09/21 0848) BP: (121-209)/(64-121) 137/99 (09/21 0848) SpO2:  [95 %-100 %] 100 % (09/21 0848) FiO2 (%):  [30 %-40 %] 40 % (09/21 0848) Intake/Output      09/20 0701 - 09/21 0700 09/21 0701 - 09/22 0700   I.V. (mL/kg) 5386.4 (60.9)    NG/GT 1380    IV Piggyback 306    Total Intake(mL/kg) 7072.4 (79.9)    Urine (mL/kg/hr) 5350 (2.5)    Stool 650    Total Output 6000    Net +1072.4         Urine Occurrence 1 x     No eye opening to pain Left pupil 74mm, non-reactive Right pupil 10mm, minimally reactive (-) corneal, (-) doll's eye Breathing over vent No motor responses to pain BUE/BLE  LABS: Lab Results  Component Value Date   CREATININE 0.32 (L) 01/23/2019   BUN 6 01/23/2019   NA 143 01/23/2019   K 3.7 01/23/2019   CL 111 01/23/2019   CO2 22 01/23/2019   Lab Results  Component Value Date   WBC 16.7 (H) 01/24/2019   HGB 8.0 (L) 01/24/2019   HCT 26.0 (L) 01/24/2019   MCV 87.5 01/24/2019   PLT 588 (H) 01/24/2019    IMAGING: CTH reviewed, no new hemorrhage, unchanged MLS. ?new cerebellar infarct  IMPRESSION: - 58 y.o. female [place] d# 50 s/p RMCA clipping. Acutely worsened neurologic exam last night, now c/w severe brainstem dysfunction. Unclear etiology however with new cerebellar infarct, ? Possibility of concomitant brainstem infarction. Also with hx of SZ this may be a consideration.  PLAN: - Will get STAT MRI brain this am - If MRI unrevealing, will likely contact neurology for repeat EEG - Will cont to attempt to maintain hypertension

## 2019-01-24 NOTE — Progress Notes (Signed)
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  AEDs initiated. 9/11-9/13 Received DDAVP 01/15/2019 -blood pressure augmentation for suspected vasospasm 9/13 DDAVP stopped 9/18 Weaning from Vent 9/19 change to pressure control. Remains on pressors, IV fluid. 9/21 acute worsening of neuro status, new cerebellar CVA on CT  STUDIES:   01/25/2019 CT scans revealing a right MCA aneurysm and hematoma 01/13/2019:  ECHO: EF 65%, normal RV, mild MV Regurg 01/15/2019 CT scan shows stable residual hematoma but increased surrounding edema. 9/13 CT head: no additional ICH, suspicion of diffuse intracranial spasm 9/14 Diagnostic Cerebral Angiogram: No significant intracranial vasospasm, perhaps mild vasospasm involving the R M1 segment 9/14 TCD: limited study, normal flow  9/16 TCD: limited study, normal flow 9/20 CT head: Small evolving ischemic infarct within the  paramedian right cerebellum, new from previous.  CULTURES:  MRSA>>negative SARS-CoV-2>> negative 02/02/2019: Blood Cx>> 01/27/2019: Respiratory Cx>> Staph Aureus and Enterobacter>>Staph pan-sensitive, Enterobacter resistant to cefazolin 01/18/19: C Diff>>neg  ANTIBIOTICS:  Vanc 9/14>>9/16 Zosyn 9/14>>9/16 Ceftriaxone 9/16>>9/21  LINES/TUBES:  01/31/2019 endotracheal tube>> 01/12/2019; 9/11>> 9/11 CVC L subclavian>> 9/13 Arterial Line>>  CONSULTANTS:  Neurosurgery Neurology  SUBJECTIVE:  Unfortunately pt had an acute change overnight and is now unresponsive, new R cerebellar infarct on CT, lowest SBP 120-140 overnight    CONSTITUTIONAL: BP (!) 137/99   Pulse (!) 128   Temp (!) 96.8 F (36 C)   Resp 18   Ht 5\' 5"  (1.651 m)   Wt 88.5 kg   LMP 05/29/2010   SpO2 100%   BMI 32.47 kg/m   I/O last 3 completed shifts: In: 9712.1 [I.V.:7493.2; NG/GT:1810; IV [id number] Out: [id number] [Urine:6525; Stool:950]     Vent Mode: PRVC FiO2 (%):  [30 %-40 %] 40 % Set Rate:  [18 bmp] 18 bmp Vt Set:  [450 mL] 450 mL PEEP:  [5 cmH20] 5 cmH20 Plateau Pressure:  [16 cmH20-22 cmH20] 22 cmH20     General:  Intubated, unresponsive HEENT: MM pink/moist Neuro:not sedated, Unresponsive to pain or voice, no corneal or conjunctival reflexes, pupils not responding to light, L pupil 94mm, R pupil 27mm  CV: s1s2 RRR, no m/r/g PULM:  CTAB, ET tube in place, full Vent support GI: soft, bsx4 active  Extremities: warm/dry, No edema  Skin: no rashes or lesions        RESOLVED PROBLEM LIST  Status Epilepticus, Hyponatremia, Hypernatremia  ASSESSMENT AND PLAN    Acute R cerebellar CVA -Neuro change overnight, now unresponsive unfortunately P: -Seen in conjunction with NS, plan to obtain MRI and possible repeat BMP -closely monitor neuro  status     R MCA aneurysm causing Subarachnoid Hemorrhage, s/p R MCA aneurysm clipping and subsequent mild M1 vasospasm; persistent  encephalopathy -presenting problem, neuro exam was improving until 9/20 -Stable head CT last night P: -Management per NS -Continue Levophed and Phenylephrine to achieve SBP 180-200 -Albumin bolus to support BP -Continue Nimodipine       Seizures -initially with status, this has resolved and no longer requiring continuous monitoring -Neurology following P: -With acutely worsening neuro status consider repeat EEG after MRI today -Continue Keppra and Phenytoin    Acute Hypoxic Respiratory failure, Pneumonia -respiratory cultures growing Staph and enterobacter  -secretions continue to improve P: -Finished course of Ceftriaxone -Neuro status precludes SBT, continue full vent support and titrate for O2 >94%      Hyperglycemia  -SSI and Basal Insulin P: -maintain Euglycemia    Anemia of Chronic Illness -stable P: -Follow CBC      BEST PRACTICE:  DVT PROPHYLAXIS: heparin SUP: PPI NUTRITION: N.p.o -tube feeds. MOBILITY: Bedrest GOALS OF CARE: Full code DISPOSITION: ICU on mechanical ventilation Family: per primary service  ATTESTATION & SIGNATURE      LABS    PULMONARY Recent Labs  Lab 01/22/19 1029  PHART 7.471*  PCO2ART 36.1  PO2ART 63.0*  HCO3 26.2  TCO2 27  O2SAT 93.0    CBC Recent Labs  Lab 01/22/19 0548 01/22/19 1029 01/23/19 0535 01/24/19 0047  HGB 7.9* 12.2 9.4* 8.0*  HCT 25.3* 36.0 30.1* 26.0*  WBC 22.9*  --  18.3* 16.7*  PLT 492*  --  585* 588*    COAGULATION Recent Labs  Lab 01/24/19 0047  INR 1.1    CARDIAC  No results for input(s): TROPONINI in the last 168 hours. No results for input(s): PROBNP in the last 168 hours.   CHEMISTRY Recent Labs  Lab 01/18/19 0347 01/19/19 0401 01/20/19 0945 01/21/19 0500 01/22/19 0548 01/22/19 1029 01/23/19 1326 01/24/19 0047  NA 149* 148* 145 143 141 143 143  --   K 3.2* 3.3* 3.4* 3.7 3.8 3.7 3.7  --   CL 114* 112* 109 109 105  --  111  --   CO2 23 22 23  24 23   --  22  --   GLUCOSE 122* 145* 141* 158* 147*  --  150*  --   BUN 9 7 6 7 6   --  6  --   CREATININE 0.61 0.55 0.49 0.42* 0.46  --  0.32*  --   CALCIUM 8.3* 8.1* 8.4* 8.4* 8.7*  --  7.0*  --   MG 1.8 2.2 2.2  --  2.3  --   --  2.3  PHOS 2.0* 2.5 2.8  --   --   --   --  3.6   Estimated Creatinine Clearance: 82.2 mL/min (A) (by C-G formula based on SCr of 0.32 mg/dL (L)).   LIVER Recent Labs  Lab 01/29/2019 0924 01/18/19 0347 01/21/19 0643 01/23/19 1326 01/24/19 0047  AST 25  --   --  258* 330*  ALT 29  --   --  199* 259*  ALKPHOS 70  --   --  372* 434*  BILITOT 0.6  --   --  1.0 1.1  PROT 6.2*  --   --  5.9* 7.0  ALBUMIN 2.2* 2.1* 2.1* 1.8* 2.3*  INR  --   --   --   --  1.1     INFECTIOUS Recent Labs  Lab 01/24/19 0047  LATICACIDVEN 1.7  PROCALCITON 0.84     ENDOCRINE CBG (last 3)  Recent Labs    01/23/19 2017 01/24/19 0010 01/24/19 0343  GLUCAP 155* 176* 249*         IMAGING x48h  - image(s) personally visualized  -   highlighted in bold Ct Head Wo Contrast  Result Date: 01/24/2019 CLINICAL DATA:  Follow-up examination for ruptured right MCA aneurysm, status post surgical clipping. EXAM: CT HEAD WITHOUT CONTRAST TECHNIQUE: Contiguous axial images were obtained from the base of the skull through the vertex without intravenous contrast. COMPARISON:  Prior CTA from 01/16/2019. FINDINGS: Brain: There has been continued interval evolution of the hemorrhage involving the right cerebral hemisphere, now less dense and decreased in size as compared to previous exam. Associated postoperative pneumocephalus has largely resolved. Evolving hypodensity within the right frontal operculum and right temporal lobe, likely a combination of edema and evolving infarct is relatively similar in distribution from previous. Associated regional mass effect and edema with approximately 9 mm of right-to-left midline shift, relatively unchanged. Right lateral ventricle is largely  effaced, with increased dilatation of the left lateral ventricle as compared to previous. Persistent crowding of the basilar cisterns is relatively similar. Subacute subarachnoid hemorrhage within the basilar cisterns is more prominent from previous, likely related to redistribution. Trace intraventricular hemorrhage is decreased from previous. No new intracranial hemorrhage. Now evident is a small focal evolving hypodensity within the paramedian right cerebellum, consistent with a small evolving ischemic infarct (series 3, image 8), new from previous. No other new or interval infarction. Vascular: Surgical clipping of right MCA bifurcation aneurysm. No appreciable asymmetric hyperdense vessel. Skull: Prior right pterional craniotomy. Overlying scalp swelling persists. Skin staples remain in place. Sinuses/Orbits: Globes and orbital soft tissues demonstrate no acute finding. Chronic mucoperiosteal thickening noted throughout the paranasal sinuses. Nasogastric tube partially visualized. No mastoid effusion. Other: None. IMPRESSION: 1. Continued interval evolution of right cerebral hemisphere hemorrhage, now less dense and decreased in size as compared to previous. Associated regional mass effect and edema with approximately 9 mm of right-to-left midline shift relatively unchanged. 2. Slightly decreased intraventricular and subarachnoid hemorrhage. No new intracranial hemorrhage. 3. Small evolving ischemic infarct within the paramedian right cerebellum, new from previous. 4. Postoperative changes from previous surgical clipping of right MCA aneurysm. Electronically Signed   By: Jeannine Boga M.D.   On: 01/24/2019 00:49   Dg Chest Port 1 View  Result Date: 01/23/2019 CLINICAL DATA:  Left-sided central line placement EXAM: PORTABLE CHEST 1 VIEW COMPARISON:  01/23/2019, 6:11 a.m. FINDINGS: Unchanged AP portable examination. Support apparatus includes endotracheal tube, partially imaged enteric tube, and left  subclavian vascular catheter, in unchanged position. No acute abnormality of the lungs in AP portable projection. The heart and mediastinum are unremarkable. IMPRESSION: 1. Unchanged AP portable examination. Support apparatus includes endotracheal tube, partially imaged enteric tube, and left subclavian vascular catheter, in unchanged position. 2.  No acute abnormality of the lungs in AP portable projection. Electronically Signed   By: Eddie Candle M.D.   On: 01/23/2019 17:18   Dg Chest Port 1 View  Result Date: 01/23/2019 CLINICAL DATA:  Respiratory failure. EXAM: PORTABLE CHEST 1 VIEW COMPARISON:  01/19/2019. FINDINGS: Lung base opacities have improved.  Lungs now clear. Endotracheal tube, enteric tube and left subclavian central venous line are stable. No pneumothorax. IMPRESSION: 1. Resolved basilar atelectasis. 2. No acute cardiopulmonary disease. 3. Stable support apparatus. Electronically Signed   By: Lajean Manes M.D.   On: 01/23/2019 07:37   [place] Ekg  Site Rite  Result Date: 01/23/2019 If Site Rite image not attached, placement could not be confirmed due to current cardiac rhythm.     CRITICAL CARE Performed by: Otilio Carpen [facility]   Total critical care time: 35 minutes  Critical care time was exclusive of separately billable procedures and treating other patients.  Critical care was necessary to treat or prevent imminent or life-threatening deterioration.  Critical care was time spent personally by me on the following activities: development of treatment plan with patient and/or surrogate as well as nursing, discussions with consultants, evaluation of patient's response to treatment, examination of patient, obtaining history from patient or surrogate, ordering and performing treatments and interventions, ordering and review of laboratory studies, ordering and review of radiographic studies, pulse oximetry and re-evaluation of patient's condition.   Otilio Carpen Okema Rollinson, PA-C [facility]   Pager# [phone], if no answer [phone]

## 2019-01-24 NOTE — Progress Notes (Signed)
Peripherally Inserted Central Catheter/Midline Placement  The IV Nurse has discussed with the patient and/or persons authorized to consent for the patient, the purpose of this procedure and the potential benefits and risks involved with this procedure.  The benefits include less needle sticks, lab draws from the catheter, and the patient may be discharged home with the catheter. Risks include, but not limited to, infection, bleeding, blood clot (thrombus formation), and puncture of an artery; nerve damage and irregular heartbeat and possibility to perform a PICC exchange if needed/ordered by physician.  Alternatives to this procedure were also discussed.  Bard Power PICC patient education guide, fact sheet on infection prevention and patient information card has been provided to patient /or left at bedside.    PICC/Midline Placement Documentation  PICC Triple Lumen 01/24/19 PICC Right Brachial 35 cm 0 cm (Active)  Indication for Insertion or Continuance of Line Prolonged intravenous therapies 01/24/19 0955  Exposed Catheter (cm) 0 cm 01/24/19 0955  Site Assessment Clean;Dry;Intact 01/24/19 0955  Lumen #1 Status Flushed;Blood return noted;Saline locked 01/24/19 0955  Lumen #2 Status Saline locked;Blood return noted;Flushed 01/24/19 0955  Lumen #3 Status Blood return noted;Flushed;Saline locked 01/24/19 0955  Dressing Type Transparent;Securing device 01/24/19 0955  Dressing Status Clean;Dry;Intact;Antimicrobial disc in place 01/24/19 0955  Dressing Change Due 01/31/19 01/24/19 0955       Frances Maywood 01/24/2019, 9:58 AM

## 2019-01-24 NOTE — Progress Notes (Signed)
Patient in MRI 

## 2019-01-24 NOTE — Progress Notes (Signed)
[facility] Progress Note Patient Name: Stephaniemarie Felten DOB: Sep 21, 1960 MRN: [id number]   Date of Service  01/24/2019  HPI/Events of Note  Patient has made > 4 liters of urine since 7 AM. Solute diuresis d/t 3% Saline Rx vs Central DI. Last Na+ = 147.   eICU Interventions  Will order: 1. BMP now and Q 4 hours. 2. Serum Osm now. 3. Urine Osm now.  4. D/C 0.9 NaCl. 5. 0.45 NaCl to run IV at 100 mL/hour.      Intervention Category Major Interventions: Electrolyte abnormality - evaluation and management  Sommer,Steven Eugene 01/24/2019, 10:36 PM

## 2019-01-24 NOTE — Progress Notes (Signed)
[facility] Progress Note Patient Name: Jessica Mayer DOB: 1960-08-31 MRN: [id number]   Date of Service  01/24/2019  HPI/Events of Note  Oliguria - Patient has now been I/O cathed multiple times in last 24 hours. Now with residual > 1 liter on bladder scan.   eICU Interventions  Will order: 1. Place Foley catheter.      Intervention Category Intermediate Interventions: Oliguria - evaluation and management  Sommer,Steven Eugene 01/24/2019, 9:54 PM

## 2019-01-25 DIAGNOSIS — E232 Diabetes insipidus: Secondary | ICD-10-CM

## 2019-01-25 DIAGNOSIS — G936 Cerebral edema: Secondary | ICD-10-CM

## 2019-01-25 DIAGNOSIS — I469 Cardiac arrest, cause unspecified: Secondary | ICD-10-CM

## 2019-01-25 LAB — PHOSPHORUS: Phosphorus: 4.2 mg/dL (ref 2.5–4.6)

## 2019-01-25 LAB — CBC WITH DIFFERENTIAL/PLATELET
Abs Immature Granulocytes: 0.23 10*3/uL — ABNORMAL HIGH (ref 0.00–0.07)
Basophils Absolute: 0 10*3/uL (ref 0.0–0.1)
Basophils Relative: 0 %
Eosinophils Absolute: 0.2 10*3/uL (ref 0.0–0.5)
Eosinophils Relative: 2 %
HCT: 21.9 % — ABNORMAL LOW (ref 36.0–46.0)
Hemoglobin: 6.8 g/dL — CL (ref 12.0–15.0)
Immature Granulocytes: 1 %
Lymphocytes Relative: 16 %
Lymphs Abs: 2.6 10*3/uL (ref 0.7–4.0)
MCH: 27.6 pg (ref 26.0–34.0)
MCHC: 31.1 g/dL (ref 30.0–36.0)
MCV: 89 fL (ref 80.0–100.0)
Monocytes Absolute: 0.9 10*3/uL (ref 0.1–1.0)
Monocytes Relative: 6 %
Neutro Abs: 12.1 10*3/uL — ABNORMAL HIGH (ref 1.7–7.7)
Neutrophils Relative %: 75 %
Platelets: 368 10*3/uL (ref 150–400)
RBC: 2.46 MIL/uL — ABNORMAL LOW (ref 3.87–5.11)
RDW: 18.3 % — ABNORMAL HIGH (ref 11.5–15.5)
WBC: 16.1 10*3/uL — ABNORMAL HIGH (ref 4.0–10.5)
nRBC: 3.7 % — ABNORMAL HIGH (ref 0.0–0.2)

## 2019-01-25 LAB — BASIC METABOLIC PANEL
Anion gap: 0 — ABNORMAL LOW (ref 5–15)
Anion gap: 7 (ref 5–15)
Anion gap: 7 (ref 5–15)
Anion gap: 7 (ref 5–15)
Anion gap: 9 (ref 5–15)
BUN: 11 mg/dL (ref 6–20)
BUN: 14 mg/dL (ref 6–20)
BUN: 7 mg/dL (ref 6–20)
BUN: <5 mg/dL — ABNORMAL LOW (ref 6–20)
BUN: <5 mg/dL — ABNORMAL LOW (ref 6–20)
BUN: <5 mg/dL — ABNORMAL LOW (ref 6–20)
BUN: <5 mg/dL — ABNORMAL LOW (ref 6–20)
CO2: 21 mmol/L — ABNORMAL LOW (ref 22–32)
CO2: 22 mmol/L (ref 22–32)
CO2: 23 mmol/L (ref 22–32)
CO2: 23 mmol/L (ref 22–32)
CO2: 24 mmol/L (ref 22–32)
CO2: 24 mmol/L (ref 22–32)
CO2: 25 mmol/L (ref 22–32)
Calcium: 6.6 mg/dL — ABNORMAL LOW (ref 8.9–10.3)
Calcium: 7.7 mg/dL — ABNORMAL LOW (ref 8.9–10.3)
Calcium: 7.8 mg/dL — ABNORMAL LOW (ref 8.9–10.3)
Calcium: 7.9 mg/dL — ABNORMAL LOW (ref 8.9–10.3)
Calcium: 8 mg/dL — ABNORMAL LOW (ref 8.9–10.3)
Calcium: 8.2 mg/dL — ABNORMAL LOW (ref 8.9–10.3)
Calcium: 8.2 mg/dL — ABNORMAL LOW (ref 8.9–10.3)
Chloride: 116 mmol/L — ABNORMAL HIGH (ref 98–111)
Chloride: 117 mmol/L — ABNORMAL HIGH (ref 98–111)
Chloride: 117 mmol/L — ABNORMAL HIGH (ref 98–111)
Chloride: 120 mmol/L — ABNORMAL HIGH (ref 98–111)
Chloride: 121 mmol/L — ABNORMAL HIGH (ref 98–111)
Chloride: >130 mmol/L (ref 98–111)
Chloride: >130 mmol/L (ref 98–111)
Creatinine, Ser: 0.34 mg/dL — ABNORMAL LOW (ref 0.44–1.00)
Creatinine, Ser: 0.41 mg/dL — ABNORMAL LOW (ref 0.44–1.00)
Creatinine, Ser: 0.43 mg/dL — ABNORMAL LOW (ref 0.44–1.00)
Creatinine, Ser: 0.43 mg/dL — ABNORMAL LOW (ref 0.44–1.00)
Creatinine, Ser: 0.5 mg/dL (ref 0.44–1.00)
Creatinine, Ser: 0.58 mg/dL (ref 0.44–1.00)
Creatinine, Ser: 0.7 mg/dL (ref 0.44–1.00)
GFR calc Af Amer: >60 mL/min (ref >60)
GFR calc Af Amer: >60 mL/min (ref >60)
GFR calc Af Amer: >60 mL/min (ref >60)
GFR calc Af Amer: >60 mL/min (ref >60)
GFR calc Af Amer: >60 mL/min (ref >60)
GFR calc Af Amer: >60 mL/min (ref >60)
GFR calc Af Amer: >60 mL/min (ref >60)
GFR calc non Af Amer: >60 mL/min (ref >60)
GFR calc non Af Amer: >60 mL/min (ref >60)
GFR calc non Af Amer: >60 mL/min (ref >60)
GFR calc non Af Amer: >60 mL/min (ref >60)
GFR calc non Af Amer: >60 mL/min (ref >60)
GFR calc non Af Amer: >60 mL/min (ref >60)
GFR calc non Af Amer: >60 mL/min (ref >60)
Glucose, Bld: 125 mg/dL — ABNORMAL HIGH (ref 70–99)
Glucose, Bld: 154 mg/dL — ABNORMAL HIGH (ref 70–99)
Glucose, Bld: 154 mg/dL — ABNORMAL HIGH (ref 70–99)
Glucose, Bld: 157 mg/dL — ABNORMAL HIGH (ref 70–99)
Glucose, Bld: 168 mg/dL — ABNORMAL HIGH (ref 70–99)
Glucose, Bld: 183 mg/dL — ABNORMAL HIGH (ref 70–99)
Glucose, Bld: 201 mg/dL — ABNORMAL HIGH (ref 70–99)
Potassium: 3.4 mmol/L — ABNORMAL LOW (ref 3.5–5.1)
Potassium: 3.5 mmol/L (ref 3.5–5.1)
Potassium: 3.5 mmol/L (ref 3.5–5.1)
Potassium: 3.6 mmol/L (ref 3.5–5.1)
Potassium: 3.7 mmol/L (ref 3.5–5.1)
Potassium: 4.3 mmol/L (ref 3.5–5.1)
Potassium: >7.5 mmol/L (ref 3.5–5.1)
Sodium: 137 mmol/L (ref 135–145)
Sodium: 147 mmol/L — ABNORMAL HIGH (ref 135–145)
Sodium: 148 mmol/L — ABNORMAL HIGH (ref 135–145)
Sodium: 152 mmol/L — ABNORMAL HIGH (ref 135–145)
Sodium: 153 mmol/L — ABNORMAL HIGH (ref 135–145)
Sodium: 159 mmol/L — ABNORMAL HIGH (ref 135–145)
Sodium: 160 mmol/L — ABNORMAL HIGH (ref 135–145)

## 2019-01-25 LAB — OSMOLALITY: Osmolality: 340 mOsm/kg (ref 275–295)

## 2019-01-25 LAB — HEMOGLOBIN AND HEMATOCRIT, BLOOD
HCT: 24.9 % — ABNORMAL LOW (ref 36.0–46.0)
Hemoglobin: 7.7 g/dL — ABNORMAL LOW (ref 12.0–15.0)

## 2019-01-25 LAB — GLUCOSE, CAPILLARY
Glucose-Capillary: 122 mg/dL — ABNORMAL HIGH (ref 70–99)
Glucose-Capillary: 130 mg/dL — ABNORMAL HIGH (ref 70–99)
Glucose-Capillary: 139 mg/dL — ABNORMAL HIGH (ref 70–99)
Glucose-Capillary: 146 mg/dL — ABNORMAL HIGH (ref 70–99)
Glucose-Capillary: 178 mg/dL — ABNORMAL HIGH (ref 70–99)
Glucose-Capillary: 182 mg/dL — ABNORMAL HIGH (ref 70–99)

## 2019-01-25 LAB — PATHOLOGIST SMEAR REVIEW

## 2019-01-25 LAB — OSMOLALITY, URINE: Osmolality, Ur: 130 mOsm/kg — ABNORMAL LOW (ref 300–900)

## 2019-01-25 LAB — MAGNESIUM: Magnesium: 2.4 mg/dL (ref 1.7–2.4)

## 2019-01-25 LAB — PROCALCITONIN: Procalcitonin: 1.58 ng/mL

## 2019-01-25 LAB — PREPARE RBC (CROSSMATCH)

## 2019-01-25 MED ORDER — DESMOPRESSIN ACETATE 4 MCG/ML IJ SOLN
4.0000 ug | Freq: Once | INTRAMUSCULAR | Status: AC
  Administered 2019-01-25: 4 ug via INTRAVENOUS
  Filled 2019-01-25: qty 1

## 2019-01-25 MED ORDER — SODIUM CHLORIDE 0.9% IV SOLUTION: Freq: Once | INTRAVENOUS | Status: DC

## 2019-01-25 MED ORDER — FREE WATER
200.0000 mL | Status: DC
  Administered 2019-01-25 – 2019-01-26 (×9): 200 mL

## 2019-01-25 MED ORDER — POTASSIUM CHLORIDE 10 MEQ/50ML IV SOLN
10.0000 meq | INTRAVENOUS | Status: AC
  Administered 2019-01-25 (×4): 10 meq via INTRAVENOUS
  Filled 2019-01-25 (×4): qty 50

## 2019-01-25 MED ORDER — DESMOPRESSIN ACETATE 4 MCG/ML IJ SOLN
1.0000 ug | Freq: Two times a day (BID) | INTRAMUSCULAR | Status: DC
  Administered 2019-01-25 – 2019-01-26 (×3): 1 ug via SUBCUTANEOUS
  Filled 2019-01-25 (×3): qty 1

## 2019-01-25 NOTE — Progress Notes (Signed)
CRITICAL VALUE ALERT  Critical Value:  Cl > 130                         Hgl 6.8  Date & Time Notied:  01/25/2019 0530  Provider Notified: CCM MD

## 2019-01-25 NOTE — Progress Notes (Signed)
[facility] Progress Note Patient Name: Jessica Mayer DOB: June 23, 1960 MRN: [id number]   Date of Service  01/25/2019  HPI/Events of Note  Multiple issues: 1. Na+ = 159 and Cl- > 130 and 2. Hgb = 6.8.  eICU Interventions  Will order: 1. Increase free water per tube to 200 mL Q 4 hours. 2. Transfuse 1 unit PRBC.        Clarice Bonaventure Eugene 01/25/2019, 5:59 AM

## 2019-01-25 NOTE — Progress Notes (Signed)
I spoke with the pts husband and two daughters regarding the patient's current condition. I told them that I believe her chance for any meaningful recovery given her current exam and MRI findings is negligible. We discussed options moving forward including my recommendation to transition to comfort measures and one-way extubation after any family members who want to be present. For now we did agree to change code status to DNR. We will maintain current level of care until they decide about transition to palliative.
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
## 2019-01-25 NOTE — Progress Notes (Signed)
RT decreased set respiratory rate to 6. Patient able to trigger vent to give a breath. RT returned RR to 18. Patient's vitals are stable at this time.

## 2019-01-25 NOTE — Progress Notes (Signed)
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 Hospital course c/b VAP, seizures, vasospasm  Was improving until overnight on 9/20 when had an acute worsening of her neurologic status; subsequent MRI showed R uncal and cerebellar tonsillar herniation with mass effect on the brainstem.  She has remained unresponsive.  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  AEDs initiated. 9/11-9/13 Received DDAVP 01/15/2019 -blood pressure augmentation for suspected vasospasm 9/13 DDAVP stopped 9/18 Weaning from Vent 9/19 change to pressure control. Remains on pressors, IV fluid. 9/21 acute worsening of neuro status, new cerebellar CVA on CT 9/22 R uncal cerebellar tonsillar herniation with mass effect on the brainstem  STUDIES:   01/22/2019 CT scans revealing a right MCA aneurysm and hematoma 01/13/2019:  ECHO: EF 65%, normal RV, mild MV Regurg 01/15/2019 CT scan shows stable residual hematoma but increased surrounding edema. 9/13 CT head: no additional ICH, suspicion of diffuse intracranial spasm 9/14 Diagnostic Cerebral Angiogram: No significant  intracranial vasospasm, perhaps mild vasospasm involving the R M1 segment 9/14 TCD: limited study, normal flow  9/16 TCD: limited study, normal flow 9/20 CT head: Small evolving ischemic infarct within the paramedian right cerebellum, new from previous. 9/21: Significant interval increase in cerebellar swelling and posterior fossa mass effect with new cerebellar tonsillar herniation and mass effect upon the brainstem, and effacement of the fourth ventricle. Superimposed small acute/subacute infarct within the paramedian right cerebellum.  CULTURES:  MRSA>>negative SARS-CoV-2>> negative 01/25/2019: Blood Cx>>negative 01/09/2019: Respiratory Cx>> Staph Aureus and Enterobacter>>Staph pan-sensitive, Enterobacter resistant to cefazolin 01/18/19: C Diff>>neg  ANTIBIOTICS:  Vanc 9/14>>9/16 Zosyn 9/14>>9/16 Ceftriaxone 9/16>>9/21  LINES/TUBES:  01/05/2019 endotracheal tube>> 01/08/2019; 9/11>> 9/11 CVC L subclavian>> 9/13 Arterial Line>>  CONSULTANTS:  Neurosurgery Neurology  SUBJECTIVE:  MRI consistent with herniation, pt unresponsive, >4L UOP overnight, possible DI    CONSTITUTIONAL: BP (!) 144/89   Pulse (!) 124   Temp 97.7 F (36.5 C) (Axillary)   Resp 18   Ht 5\' 5"  (1.651 m)   Wt 88.5 kg   LMP 05/29/2010   SpO2 100%   BMI 32.47 kg/m   I/O last 3 completed shifts: In: 13401.6 [I.V.:10156.9; NG/GT:2733; IV Piggyback:511.7] Out: 10155 [Urine:9855; Stool:300]     Vent Mode: PRVC FiO2 (%):  [40 %] 40 % Set Rate:  [18 bmp] 18 bmp Vt Set:  [450 mL] 450 mL PEEP:  [5 cmH20] 5 cmH20 Plateau Pressure:  [19 cmH20-22 cmH20] 19 cmH20    General:  Intubated, unresponsive HEENT: MM pink/moist, L pupil 37mm, R pupil 64mm both unresponsive to light Neuro: Unresponsive, does not withdraw to pain, no spontaneous movement CV: s1s2 tachycardic, regular rate no m/r/g, PULM:  Intubated on full vent support, no longer breathing over set rate GI: soft, bsx4 active  Extremities: warm/dry, no  edema  Skin: no rashes or lesions      RESOLVED PROBLEM LIST  Status Epilepticus, Hyponatremia,   ASSESSMENT AND PLAN    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 -very unfortunate development and pt is unresponsive -per NS, picture is consistent with global metabolic or toxic/ischemic event, etiology unclear -prognosis very poor  -already hypernatremic, no indication for mannitol P: -NS attempted to reach husband yesterday, continue supportive care and family discussion today    R MCA aneurysm causing Subarachnoid Hemorrhage, s/p R MCA aneurysm clipping and subsequent mild M1 vasospasm; persistent encephalopathy -presenting problem, neuro exam was improving until 9/20 P: -Continuing to support BP with IVF, blood transfusion and pressors -Cont Nimodipine    Central Diabetes Insipidus ->4L UOP overnight with decreased urine osms -received 17mcg DDAVP -Na 160 and Chloride>130 this AM P: -Continue 0.45% NS and DDAVP 32mcg q12hrs  Anemia of Chronic Illness -Hgb drift likely due to critical illness P: -1 unit PRBC's ordered overnight   Seizures -initially with status, this has resolved and no longer requiring continuous monitoring -Neurology following P: -continue Keppra and Phenytoin    Acute Hypoxic Respiratory failure, Pneumonia -Respiratory cultures grew Staph and Enterobacter P: -completed course of Ceftriaxone -Devastating Neurologic injury, expect that pt will not regain brainstem function -continue full vent support until goals of care discussion with family, titrate FiO2 <94% -VAP bundle   Hyperglycemia  -SSI and Basal Insulin     BEST PRACTICE:  DVT PROPHYLAXIS: heparin SUP: PPI NUTRITION: N.p.o -tube feeds. MOBILITY: Bedrest GOALS OF CARE: Full code DISPOSITION: ICU on mechanical ventilation Family: per primary service    LABS    PULMONARY Recent Labs  Lab 01/22/19 1029  PHART 7.471*  PCO2ART 36.1   PO2ART 63.0*  HCO3 26.2  TCO2 27  O2SAT 93.0    CBC Recent Labs  Lab 01/23/19 0535 01/24/19 0047 01/25/19 0356  HGB 9.4* 8.0* 6.8*  HCT 30.1* 26.0* 21.9*  WBC 18.3* 16.7* 16.1*  PLT 585* 588* 368    COAGULATION Recent Labs  Lab 01/24/19 0047  INR 1.1    CARDIAC  No results for input(s): TROPONINI in the last 168 hours. No results for input(s): PROBNP in the last 168 hours.   CHEMISTRY Recent Labs  Lab 01/19/19 0401 01/20/19 0945  01/22/19 0548 01/22/19 1029 01/23/19 1326 01/24/19 0047 01/24/19 2251 01/25/19 0356 01/25/19 0609  NA 148* 145   < > 141 143 143  --  160* 159* 160*  K 3.3* 3.4*   < > 3.8 3.7 3.7  --  3.2* 4.3 3.6  CL 112* 109   < > 105  --  111  --  129* >130* >130*  CO2 22 23   < > 23  --  22  --  23 21* 23  GLUCOSE 145* 141*   < > 147*  --  150*  --  178* 183* 168*  BUN 7 6   < > 6  --  6  --  16 14 11   CREATININE 0.55 0.49   < > 0.46  --  0.32*  --  0.96 0.70 0.58  CALCIUM 8.1* 8.4*   < > 8.7*  --  7.0*  --  8.4* 8.2* 8.2*  MG 2.2 2.2  --  2.3  --   --  2.3  --  2.4  --   PHOS 2.5 2.8  --   --   --   --  3.6  --  4.2  --    < > = values in this interval not displayed.   Estimated Creatinine Clearance: 82.2 mL/min (by C-G formula based on SCr of 0.58 mg/dL).   LIVER Recent Labs  Lab 01/21/19 0643 01/23/19 1326 01/24/19 0047  AST  --  258* 330*  ALT  --  199* 259*  ALKPHOS  --  372* 434*  BILITOT  --  1.0 1.1  PROT  --  5.9* 7.0  ALBUMIN 2.1* 1.8* 2.3*  INR  --   --  1.1     INFECTIOUS Recent Labs  Lab 01/24/19 0047 01/25/19 0356  LATICACIDVEN 1.7  --   PROCALCITON 0.84 1.58     ENDOCRINE CBG (last 3)  Recent Labs    01/24/19 2011 01/24/19 2340 01/25/19 0334  GLUCAP 145* 155* 178*         IMAGING x48h  - image(s) personally visualized  -   highlighted in bold Ct Head Wo Contrast  Result Date: 01/24/2019 CLINICAL DATA:  Follow-up examination for ruptured right MCA aneurysm, status post surgical clipping.  EXAM: CT HEAD WITHOUT CONTRAST TECHNIQUE: Contiguous axial images were obtained from the base of the skull through the vertex without intravenous contrast. COMPARISON:  Prior CTA from 01/16/2019. FINDINGS: Brain: There has been continued interval evolution of the hemorrhage involving the right cerebral hemisphere, now less dense and decreased in size as compared to previous exam. Associated postoperative pneumocephalus has largely resolved. Evolving hypodensity within the right frontal operculum and right temporal lobe, likely a combination of edema and evolving infarct is relatively similar in distribution from previous. Associated regional mass effect and edema with approximately 9 mm of right-to-left midline shift, relatively unchanged. Right lateral ventricle is largely effaced, with increased dilatation of the left lateral ventricle as compared to previous. Persistent crowding of the basilar cisterns is relatively similar. Subacute subarachnoid hemorrhage within the basilar cisterns is more prominent from previous, likely related to redistribution. Trace intraventricular hemorrhage is decreased from previous. No new intracranial hemorrhage. Now evident is a small focal evolving hypodensity within the paramedian right cerebellum, consistent with a small evolving ischemic infarct (series 3, image 8), new from previous. No other new or interval infarction. Vascular: Surgical clipping of right MCA bifurcation aneurysm. No appreciable asymmetric hyperdense vessel. Skull: Prior right pterional craniotomy. Overlying scalp swelling persists. Skin staples remain in place. Sinuses/Orbits: Globes and orbital soft tissues demonstrate no acute finding. Chronic mucoperiosteal thickening noted throughout the paranasal sinuses. Nasogastric tube partially visualized. No mastoid effusion. Other: None. IMPRESSION: 1. Continued interval evolution of right cerebral hemisphere hemorrhage, now less dense and decreased in size as  compared to previous. Associated regional mass effect and edema with approximately 9 mm of right-to-left midline shift relatively unchanged. 2. Slightly decreased intraventricular and subarachnoid hemorrhage. No new intracranial hemorrhage. 3. Small evolving ischemic infarct within the paramedian right cerebellum, new from previous. 4. Postoperative changes from previous surgical clipping of right MCA aneurysm. Electronically Signed   By: Jeannine Boga M.D.   On: 01/24/2019 00:49   Mr Jeri Cos [id number] Contrast  Result Date: 01/24/2019 CLINICAL DATA:  Noncontrast head CT 01/23/2019 EXAM: MRI HEAD WITHOUT AND WITH CONTRAST TECHNIQUE: Multiplanar, multiecho pulse sequences of the brain and surrounding structures were obtained without and with intravenous contrast. CONTRAST:  50mL GADAVIST GADOBUTROL 1 MMOL/ML IV SOLN COMPARISON:  Noncontrast head CT 01/23/2019, CT angiogram head 01/16/2019, head CT 01/15/2019, CT angiogram head/neck 01/29/2019 FINDINGS: Brain: Large right cerebral hemorrhage with prominent surrounding vasogenic edema, not  significantly changed in size as compared to head CT 01/23/2019. Small acute/subacute infarcts surrounding the hemorrhage within the right frontal and temporal lobes again suspected, however, evaluation is limited due to susceptibility artifact from the large hemorrhage and from the right-sided vascular clip. Persistent near complete effacement of the right lateral ventricle with 9 mm leftward midline shift. Asymmetric prominence of the left lateral ventricle atrium and temporal horn, similar to prior, and concerning for entrapment. Right uncal herniation with mass effect upon the brainstem and severe effacement of the basilar cisterns, also present on prior exam. Redemonstrated trace intraventricular blood products. Diffuse SWI signal loss within the supratentorial and infratentorial sulci consistent with subarachnoid blood products and/or vasodilated vessels. There is T2  hyperintensity diffusely throughout the cerebral cortex as well as within the bilateral basal ganglia, and findings are suspicious for hypoxic ischemic injury. Significantly increased from prior exam, there is diffuse cerebellar swelling and T2 hyperintensity. Additionally, there is probable diffuse diffusion weighted signal abnormality throughout the cerebellum. Findings are also suspicious for sequela of hypoxic ischemic injury. Marked posterior fossa mass effect increased from prior exam and with cerebellar tonsillar herniation and mass effect upon the brainstem, as well as effacement of the fourth ventricle. Superimposed, there is a small acute/subacute infarct within the paramedian right cerebellum. Precontrast T1 hyperintensity within the large right cerebral hemorrhage limits evaluation for enhancement at this site. No abnormal enhancement is appreciated elsewhere within the brain. Vascular: Right MCA bifurcation aneurysm clip. Proximal flow voids poorly assessed due to severe intracranial mass effect. Skull and upper cervical spine: Sequela of prior right pterional craniotomy. Sinuses/Orbits: Visualized orbits demonstrate no acute abnormality. Mild scattered paranasal sinus mucosal thickening. Small bilateral mastoid effusions. These results were called by telephone at the time of interpretation on 01/24/2019 at 3:20 pm to provider Dr. Ashok Pall, Who verbally acknowledged these results. IMPRESSION: 1. Diffuse signal abnormality throughout the cerebral cortex and within the basal ganglia. Diffuse signal abnormality and suspected diffuse diffusion abnormality throughout the cerebellum. Findings likely reflect hypoxic ischemic injury. 2. Significant interval increase in cerebellar swelling and posterior fossa mass effect with new cerebellar tonsillar herniation and mass effect upon the brainstem, and effacement of the fourth ventricle. Superimposed small acute/subacute infarct within the paramedian right  cerebellum. 3. Large right cerebral hemorrhage with prominent surrounding vasogenic edema, 9 mm leftward midline shift, right uncal herniation with mass effect upon the brainstem, and suspected entrapment of the left lateral ventricle. Findings are similar to prior CT. Suspected acute/subacute infarcts within the right frontal and temporal lobe surrounding the hemorrhage. 4. Diffuse SWI signal loss within the supratentorial and infratentorial sulci consistent with subarachnoid blood products and or vasodilated vessels. Electronically Signed   By: Kellie Simmering   On: 01/24/2019 15:34   Dg Chest Port 1 View  Result Date: 01/23/2019 CLINICAL DATA:  Left-sided central line placement EXAM: PORTABLE CHEST 1 VIEW COMPARISON:  01/23/2019, 6:11 a.m. FINDINGS: Unchanged AP portable examination. Support apparatus includes endotracheal tube, partially imaged enteric tube, and left subclavian vascular catheter, in unchanged position. No acute abnormality of the lungs in AP portable projection. The heart and mediastinum are unremarkable. IMPRESSION: 1. Unchanged AP portable examination. Support apparatus includes endotracheal tube, partially imaged enteric tube, and left subclavian vascular catheter, in unchanged position. 2.  No acute abnormality of the lungs in AP portable projection. Electronically Signed   By: Eddie Candle M.D.   On: 01/23/2019 17:18   Vas [place] Transcranial Doppler  Result Date: 01/24/2019  Transcranial Doppler Indications:  Subarachnoid hemorrhage. Limitations for diagnostic windows: Unable to insonate right transtemporal window. Unable to insonate left transtemporal window. Comparison Study: 01/21/19 Performing Technologist: Oliver Hum RVT  Examination Guidelines: A complete evaluation includes B-mode imaging, spectral Doppler, color Doppler, and power Doppler as needed of all accessible portions of each vessel. Bilateral testing is considered an integral part of a complete examination. Limited  examinations for reoccurring indications may be performed as noted.  +----------+-------------+----------+-----------+-------+ RIGHT TCD Right VM (cm)Depth (cm)PulsatilityComment +----------+-------------+----------+-----------+-------+ Opthalmic     13.00                 1.16            +----------+-------------+----------+-----------+-------+ ICA siphon    11.00                 0.63            +----------+-------------+----------+-----------+-------+ Vertebral     -5.00                 2.78            +----------+-------------+----------+-----------+-------+  +----------+------------+----------+-----------+-------+ LEFT TCD  Left VM (cm)Depth (cm)PulsatilityComment +----------+------------+----------+-----------+-------+ Opthalmic    16.00                 1.12            +----------+------------+----------+-----------+-------+ ICA siphon   22.00                 1.12            +----------+------------+----------+-----------+-------+ Vertebral    -9.00                 2.74            +----------+------------+----------+-----------+-------+  +------------+-------+-------+             VM cm/sComment +------------+-------+-------+ Prox Basilar -7.00  2.67   +------------+-------+-------+    Preliminary    [place] Ekg Site Rite  Result Date: 01/23/2019 If Site Rite image not attached, placement could not be confirmed due to current cardiac rhythm.     CRITICAL CARE Performed by: Otilio Carpen Jeffie Spivack   Total critical care time: 40 minutes  Critical care time was exclusive of separately billable procedures and treating other patients.  Critical care was necessary to treat or prevent imminent or life-threatening deterioration.  Critical care was time spent personally by me on the following activities: development of treatment plan with patient and/or surrogate as well as nursing, discussions with consultants, evaluation of patient's response to treatment,  examination of patient, obtaining history from patient or surrogate, ordering and performing treatments and interventions, ordering and review of laboratory studies, ordering and review of radiographic studies, pulse oximetry and re-evaluation of patient's condition.   Otilio Carpen Joydan Gretzinger, PA-C [facility]  Pager# [phone], if no answer [phone]

## 2019-01-25 NOTE — Progress Notes (Signed)
[facility] Progress Note Patient Name: Jessica Mayer DOB: 1960/08/20 MRN: [id number]   Date of Service  01/25/2019  HPI/Events of Note  Repeat BMP: Na+ = 148 and K+ = 3.7.  eICU Interventions  Continue present management.      Intervention Category Major Interventions: Electrolyte abnormality - evaluation and management  Delaney Perona Cornelia Copa 01/25/2019, 9:58 PM

## 2019-01-25 NOTE — Progress Notes (Signed)
[facility] Progress Note Patient Name: Jessica Mayer DOB: July 08, 1960 MRN: [id number]   Date of Service  01/25/2019  HPI/Events of Note  Na+ = 160 and urine output for last hour+ = 700 mL. S Osm = 348 and Urine Osm = 130. Labs c/w Central DI. BMP Q 4 hours already ordered.   eICU Interventions  Will order: 1. DDAVP 4 mcg IV now.  2. Replace urine output mL/mL with 0.45 NaCl Q 1 hour. 3. Increase 0.45 NaCl IV infusion to 150 mL/hour.      Intervention Category Major Interventions: Electrolyte abnormality - evaluation and management  Sommer,Steven Eugene 01/25/2019, 12:28 AM

## 2019-01-25 NOTE — Progress Notes (Signed)
[facility] Progress Note Patient Name: Jessica Mayer DOB: 09/12/1960 MRN: [id number]   Date of Service  01/25/2019  HPI/Events of Note  Hypokalemia - K+ = 3.2 and Creatinine = 0.96.   eICU Interventions  Will replace K+.      Intervention Category Major Interventions: Electrolyte abnormality - evaluation and management  Sommer,Steven Eugene 01/25/2019, 1:26 AM

## 2019-01-25 NOTE — Progress Notes (Signed)
[facility] Progress Note Patient Name: Jessica Mayer DOB: 1960-10-29 MRN: [id number]   Date of Service  01/25/2019  HPI/Events of Note  K+ > 7.5 and Na+ = 137. Question both results as K+ =3.5 and Na+ = 153 only 4 hours earlier and Creatinine = 0.34.  eICU Interventions  Will order: 1. BMP STAT.     Intervention Category Major Interventions: Electrolyte abnormality - evaluation and management  Selby Foisy Eugene 01/25/2019, 7:30 PM

## 2019-01-25 NOTE — Progress Notes (Signed)
CRITICAL VALUE ALERT  Critical Value: Serum Osmolality 340  Date & Time Notied:  0010 01/25/2019  Provider Notified: CCM MD  Orders Received/Actions taken: New orders received

## 2019-01-25 NOTE — Progress Notes (Signed)
  NEUROSURGERY PROGRESS NOTE   Pt seen and examined. Pt noted to have increased UO and Na 160 yesterday. No change in exam noted.  EXAM: Temp:  [96.4 F (35.8 C)-97.7 F (36.5 C)] 97.6 F (36.4 C) (09/22 0847) Pulse Rate:  [112-131] 123 (09/22 1037) Resp:  [14-20] 18 (09/22 0900) BP: (80-166)/(41-111) 166/109 (09/22 1037) SpO2:  [84 %-100 %] 91 % (09/22 0819) FiO2 (%):  [40 %] 40 % (09/22 0819) Intake/Output      09/21 0701 - 09/22 0700 09/22 0701 - 09/23 0700   I.V. (mL/kg) 7111 (80.4) 1241.9 (14)   Blood  645   NG/GT 1673    IV Piggyback 408.7    Total Intake(mL/kg) 9192.8 (103.9) 1886.9 (21.3)   Urine (mL/kg/hr) 7305 (3.4)    Stool 225    Total Output 7530    Net +1662.8 +1886.9         No eye opening to pain Pupils non-reactive (-) Doll's eyes (-) cough, (-) gag Not breathing over vent No motor responses to pain  LABS: Lab Results  Component Value Date   CREATININE 0.58 01/25/2019   BUN 11 01/25/2019   NA 160 (H) 01/25/2019   K 3.6 01/25/2019   CL >130 (HH) 01/25/2019   CO2 23 01/25/2019   Lab Results  Component Value Date   WBC 16.1 (H) 01/25/2019   HGB 6.8 (LL) 01/25/2019   HCT 21.9 (L) 01/25/2019   MCV 89.0 01/25/2019   PLT 368 01/25/2019    IMAGING: MRI reviewed as documented yesterday, c/w global toxic/ischemic cerebral/cerebellar insult with significant pos fossa mass effect and brainstem compression.  IMPRESSION: - 58 y.o. female [place] d# 39 s/p RMCA clipping with acute decline in condition 36hrs ago, exam c/w brain death (formal exam not completed) and MRI demonstrating diffuse global ischemic insult. Prognosis for any meaningful recovery is dismal.  PLAN: - Spoke to the patient's husband regarding her current condition and the negligible chance for recovery. We discussed and he agreed to changing code status to DNR. - Will cont current therapy/treatments for now. I did discuss with the patient's husband the option to transition goal of care to a  more palliative approach with one-way extubation. He will need to discuss further with children. Tentatively planning on face-to-face discussion this afternoon at bedside.

## 2019-01-25 NOTE — Progress Notes (Signed)
Assisted tele visit to patient with daughter.  Alver Sorrow, RN

## 2019-01-25 NOTE — Progress Notes (Signed)
RT decreased set respiratory rate on ventilator to 6. Patient was able to trigger breath. RT returned set rate back to 18.

## 2019-01-26 LAB — CBC WITH DIFFERENTIAL/PLATELET
Abs Immature Granulocytes: 0.15 10*3/uL — ABNORMAL HIGH (ref 0.00–0.07)
Basophils Absolute: 0 10*3/uL (ref 0.0–0.1)
Basophils Relative: 0 %
Eosinophils Absolute: 0.2 10*3/uL (ref 0.0–0.5)
Eosinophils Relative: 1 %
HCT: 23.7 % — ABNORMAL LOW (ref 36.0–46.0)
Hemoglobin: 7.3 g/dL — ABNORMAL LOW (ref 12.0–15.0)
Immature Granulocytes: 1 %
Lymphocytes Relative: 10 %
Lymphs Abs: 1.5 10*3/uL (ref 0.7–4.0)
MCH: 28.1 pg (ref 26.0–34.0)
MCHC: 30.8 g/dL (ref 30.0–36.0)
MCV: 91.2 fL (ref 80.0–100.0)
Monocytes Absolute: 0.5 10*3/uL (ref 0.1–1.0)
Monocytes Relative: 3 %
Neutro Abs: 13.3 10*3/uL — ABNORMAL HIGH (ref 1.7–7.7)
Neutrophils Relative %: 85 %
Platelets: 298 10*3/uL (ref 150–400)
RBC: 2.6 MIL/uL — ABNORMAL LOW (ref 3.87–5.11)
RDW: 17.7 % — ABNORMAL HIGH (ref 11.5–15.5)
WBC: 15.6 10*3/uL — ABNORMAL HIGH (ref 4.0–10.5)
nRBC: 2.7 % — ABNORMAL HIGH (ref 0.0–0.2)

## 2019-01-26 LAB — BASIC METABOLIC PANEL
Anion gap: 9 (ref 5–15)
Anion gap: 8 (ref 5–15)
Anion gap: 6 (ref 5–15)
BUN: <5 mg/dL — ABNORMAL LOW (ref 6–20)
BUN: <5 mg/dL — ABNORMAL LOW (ref 6–20)
BUN: <5 mg/dL — ABNORMAL LOW (ref 6–20)
CO2: 23 mmol/L (ref 22–32)
CO2: 26 mmol/L (ref 22–32)
CO2: 24 mmol/L (ref 22–32)
Calcium: 8.1 mg/dL — ABNORMAL LOW (ref 8.9–10.3)
Calcium: 8.4 mg/dL — ABNORMAL LOW (ref 8.9–10.3)
Calcium: 7.9 mg/dL — ABNORMAL LOW (ref 8.9–10.3)
Chloride: 112 mmol/L — ABNORMAL HIGH (ref 98–111)
Chloride: 112 mmol/L — ABNORMAL HIGH (ref 98–111)
Chloride: 113 mmol/L — ABNORMAL HIGH (ref 98–111)
Creatinine, Ser: 0.47 mg/dL (ref 0.44–1.00)
Creatinine, Ser: 0.46 mg/dL (ref 0.44–1.00)
Creatinine, Ser: 0.49 mg/dL (ref 0.44–1.00)
GFR calc Af Amer: >60 mL/min (ref >60)
GFR calc Af Amer: >60 mL/min (ref >60)
GFR calc Af Amer: >60 mL/min (ref >60)
GFR calc non Af Amer: >60 mL/min (ref >60)
GFR calc non Af Amer: >60 mL/min (ref >60)
GFR calc non Af Amer: >60 mL/min (ref >60)
Glucose, Bld: 164 mg/dL — ABNORMAL HIGH (ref 70–99)
Glucose, Bld: 100 mg/dL — ABNORMAL HIGH (ref 70–99)
Glucose, Bld: 142 mg/dL — ABNORMAL HIGH (ref 70–99)
Potassium: 3.2 mmol/L — ABNORMAL LOW (ref 3.5–5.1)
Potassium: 3.4 mmol/L — ABNORMAL LOW (ref 3.5–5.1)
Potassium: 4.2 mmol/L (ref 3.5–5.1)
Sodium: 144 mmol/L (ref 135–145)
Sodium: 146 mmol/L — ABNORMAL HIGH (ref 135–145)
Sodium: 143 mmol/L (ref 135–145)

## 2019-01-26 LAB — MAGNESIUM: Magnesium: 1.6 mg/dL — ABNORMAL LOW (ref 1.7–2.4)

## 2019-01-26 LAB — TYPE AND SCREEN
ABO/RH(D): A POS
Antibody Screen: NEGATIVE
Unit division: 0

## 2019-01-26 LAB — BPAM RBC
Blood Product Expiration Date: 202010092359
ISSUE DATE / TIME: 202009220812
Unit Type and Rh: 6200

## 2019-01-26 LAB — PHOSPHORUS: Phosphorus: 3.3 mg/dL (ref 2.5–4.6)

## 2019-01-26 LAB — GLUCOSE, CAPILLARY
Glucose-Capillary: 135 mg/dL — ABNORMAL HIGH (ref 70–99)
Glucose-Capillary: 147 mg/dL — ABNORMAL HIGH (ref 70–99)
Glucose-Capillary: 126 mg/dL — ABNORMAL HIGH (ref 70–99)

## 2019-01-26 MED ORDER — MAGNESIUM SULFATE 2 GM/50ML IV SOLN
2.0000 g | Freq: Once | INTRAVENOUS | Status: AC
  Administered 2019-01-26: 2 g via INTRAVENOUS
  Filled 2019-01-26: qty 50

## 2019-01-26 MED ORDER — POTASSIUM CHLORIDE 20 MEQ/15ML (10%) PO SOLN
40.0000 meq | Freq: Once | ORAL | Status: AC
  Administered 2019-01-26: 40 meq
  Filled 2019-01-26: qty 30

## 2019-02-03 NOTE — Progress Notes (Signed)
RT changed set respiratory rate to 6. Patient able to trigger vent to give a breath. RT returned set rate to 18. Patient tolerating well at this time.

## 2019-02-03 NOTE — Progress Notes (Addendum)
RN noticed ST elevation at approx 1558, which quickly changed in to a ventricular rhythm. Unable to obtain SPO2 reading. Widened pulse pressure at 1600: 94/11(35). Dr. Elsworth Soho paged-advised to call family. Attempted to reach pts husband several times, to no avail. Contacted pts daughter's. Daughter Mardene Celeste is at the bedside. Awaiting Ynka. Dr. Elsworth Soho notified with time of death Aug 15, 1622). Chaplin at bedside with Mardene Celeste.

## 2019-02-03 NOTE — Progress Notes (Signed)
RR was decreased to 6 and patient was able to trigger breaths for a total RR of 8. Patient was placed back on a RR of 18 after trial.

## 2019-02-03 NOTE — Progress Notes (Signed)
RR was decreased to 6 and patient was able to trigger breaths for a total RR of 11. Patient was placed back on a RR of 18 after trial.

## 2019-02-03 NOTE — Death Summary Note (Signed)
DEATH SUMMARY   Patient Details  Name: Jessica Mayer MRN: [id number] DOB: Mar 07, 1961  Admission/Discharge Information   Admit Date:  02-01-19  Date of Death: Date of Death: 02/16/2019  Time of Death: Time of Death: 08/19/22  Length of Stay: 08-Aug-2022  Referring Physician: Lujean Amel, MD   Reason(s) for Hospitalization  Subarachnoid hemorrhage  Diagnoses  Preliminary cause of death: Subarachnoid Hemorrhage Secondary Diagnoses (including complications and co-morbidities):  Active Problems:   ICH (intracerebral hemorrhage) (HCC)   Respiratory failure (HCC)   Vasospasm of cerebral artery   SAH (subarachnoid hemorrhage) (HCC)   Vasospasm ([place])   Brief Hospital Course (including significant findings, care, treatment, and services provided and events leading to death)  Jessica Mayer is a 58 y.o. year old female who initially presented subacutely after being found down for several days at home.  Initial work-up included CT scan demonstrating diffuse subarachnoid hemorrhage.  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  She appeared to be at neurologic baseline postoperatively, however her postop course was then complicated by status epilepticus which was controlled with multiple antiepileptic medications.  She did also undergo diagnostic angiogram with concern for possible vasospasm which was not diagnostic for large vessel spasm.  Nonetheless, she was treated with hyperdynamic treatment for possible small vessel spasm.  Her neurologic status appeared to improve somewhat, however she acutely declined on hemorrhage day #14.  Initial CT scan was largely unremarkable, however her neurologic exam was consistent with severe brainstem dysfunction.  MRI was done  which demonstrated significant bilateral both supratentorial and infratentorial T2 changes consistent with global hypoxic ischemic event.  Her exam remained unchanged with evidence of severe brainstem dysfunction, and lack of most brainstem reflexes with the exception of occasional spontaneous breaths.  Lengthy discussion was had with the patient's family regarding overall poor prognosis and minimal chance for meaningful neurologic recovery.  She was changed to DNR, and expired the next day.    Pertinent Labs and Studies  Significant Diagnostic Studies Ct Angio Head W Or Wo Contrast  Result Date: 01/16/2019 CLINICAL DATA:  Follow-up intracranial hemorrhage. Right MCA aneurysm previously demonstrated. 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 Clipping of right MCA aneurysm. EXAM: CT ANGIOGRAPHY HEAD TECHNIQUE: Multidetector CT imaging of the head was performed using the standard protocol during bolus administration of intravenous contrast. Multiplanar CT image reconstructions and MIPs were obtained to evaluate the vascular anatomy. CONTRAST:  27mL OMNIPAQUE IOHEXOL 350 MG/ML SOLN COMPARISON:  01/15/2019 FINDINGS: CT HEAD Brain: No increased hemorrhage in the right hemisphere. Postoperative hematoma and air as seen previously. Infarction of the right temporal lobe and right frontal operculum as seen previously. No evidence of extension otherwise. Increased mass effect with right-to-left shift now measuring 9 mm. Small amount of subarachnoid blood and intraventricular blood appears the same. Vascular: See below Skull: Pterional craniotomy. Sinuses: Clear Orbits: Right periorbital soft tissue swelling. 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 Clipping of previously seen large right MCA aneurysm. Question 3 mm residual component as marked on the axial imaging. Question spasm of more distal branch vessels. On the  left, the ICA is patent. Anterior and middle cerebral vessels are patent. There may also be a degree of spasm. Again, question  2 mm anterior communicating region aneurysm. Posterior circulation: Both vertebral arteries are patent to the basilar. Question spasm of the posterior circulation branch vessels. No evidence of large or medium vessel occlusion. No posterior circulation aneurysm. Venous sinuses: Early opacification of the venous system. This could be delayed due to increased intracranial pressure. Anatomic variants: None other. IMPRESSION: No evidence of additional intracranial hemorrhage. Worsened intracranial swelling with right-to-left shift now measuring 9 mm. Probable infarctions in the right temporal lobe and right frontal operculum. Aneurysm clipping of large right MCA aneurysm with question of a 3 mm residual component. Suspicion of diffuse intracranial spasm. Delayed visualization of the venous sinuses possibly due to increased intracranial pressure. Electronically Signed   By: Nelson Chimes M.D.   On: 01/16/2019 15:08   Ct Angio Head W/cm &/or Wo Cm  Result Date: 02/01/2019 CLINICAL DATA:  Follow-up examination for acute intracranial hemorrhage. EXAM: CT ANGIOGRAPHY HEAD AND NECK TECHNIQUE: Multidetector CT imaging of the head and neck was performed using the standard protocol during bolus administration of intravenous contrast. Multiplanar CT image reconstructions and MIPs were obtained to evaluate the vascular anatomy. Carotid stenosis measurements (when applicable) are obtained utilizing NASCET criteria, using the distal internal carotid diameter as the denominator. CONTRAST:  70mL OMNIPAQUE IOHEXOL 350 MG/ML SOLN COMPARISON:  Prior head CT from earlier the same day. FINDINGS: CTA NECK FINDINGS Aortic arch: Visualized aortic arch of normal caliber with normal 3 vessel morphology. No hemodynamically significant stenosis seen about the origin of the great vessels. Visualized subclavian arteries  widely patent. Right carotid system: Right common and internal carotid arteries widely patent without stenosis, dissection, or occlusion. Focal tortuosity of the distal right cervical ICA noted. Left carotid system: Left common and internal carotid arteries widely patent without stenosis, dissection, or occlusion. No significant atheromatous narrowing about the left carotid bifurcation. Mild tortuosity the proximal cervical left ICA noted. Vertebral arteries: Both vertebral arteries arise from the subclavian arteries. Vertebral arteries widely patent within the neck without stenosis, dissection, or occlusion. Skeleton: No acute osseous abnormality. No discrete lytic or blastic osseous lesions. Degenerative osteoarthritic changes noted about the C1-2 articulation. Other neck: No other acute soft tissue abnormality within the neck. Upper chest: Multifocal parenchymal opacity seen within the visualized left upper and lower lobes, consistent with pneumonia. Visualized right lung is clear. Review of the MIP images confirms the above findings CTA HEAD FINDINGS Anterior circulation: Petrous, cavernous, and supraclinoid segments of the internal carotid arteries are widely patent without stenosis. ICA termini well perfused. A1 segments patent bilaterally. Subtle 2 mm focal outpouching arising from the anterior communicating artery complex suspicious for a small aneurysm (series 12, image 525). This is directed laterally to the right. Anterior cerebral arteries widely patent distally. Right M1 widely patent. There is an irregular aneurysm positioned at the right MCA bifurcation measuring 8.1 x 4.8 x 4.8 mm, consistent with source of acute intracranial hemorrhage (series 12, image 521). Right MCA branches well perfused distally, although demonstrate diffuse irregularity, likely reflecting a degree of basal spasm. Left M1 widely patent. Normal left MCA bifurcation. Distal left MCA branches well perfused. Posterior circulation:  Vertebral arteries diminutive but widely patent to the vertebrobasilar junction without stenosis. Posterior inferior cerebellar arteries patent proximally. Basilar diminutive but widely patent to its distal aspect. Superior cerebral arteries patent bilaterally. Predominant fetal type origin of the PCAs, supplied via a robust bilateral posterior communicating arteries. PCAs well perfused to their distal aspects without stenosis. Venous sinuses: Grossly patent allowing for timing the  contrast bolus. Anatomic variants: Predominant fetal type origin of the PCAs with overall diminutive vertebrobasilar system. Review of the MIP images confirms the above findings IMPRESSION: 1. 8.1 x 4.8 x 4.8 mm ruptured right MCA bifurcation aneurysm, consistent with the source of acute intracranial hemorrhage. 2. Additional 2 mm focal outpouching arising from the right aspect of the anterior communicating artery complex, suspicious for an additional small aneurysm. 3. Wide patency of the major arterial vasculature of the head and neck, with no hemodynamically significant or correctable stenosis. 4. Predominant fetal type origin of the PCAs with overall diminutive vertebrobasilar system. 5. Multifocal parenchymal opacity within the visualized left lung, consistent with pneumonia or possibly aspiration. Electronically Signed   By: Jeannine Boga M.D.   On: 01/15/2019 20:27   Ct Head Wo Contrast  Result Date: 01/24/2019 CLINICAL DATA:  Follow-up examination for ruptured right MCA aneurysm, status post surgical clipping. EXAM: CT HEAD WITHOUT CONTRAST TECHNIQUE: Contiguous axial images were obtained from the base of the skull through the vertex without intravenous contrast. COMPARISON:  Prior CTA from 01/16/2019. FINDINGS: Brain: There has been continued interval evolution of the hemorrhage involving the right cerebral hemisphere, now less dense and decreased in size as compared to previous exam. Associated postoperative  pneumocephalus has largely resolved. Evolving hypodensity within the right frontal operculum and right temporal lobe, likely a combination of edema and evolving infarct is relatively similar in distribution from previous. Associated regional mass effect and edema with approximately 9 mm of right-to-left midline shift, relatively unchanged. Right lateral ventricle is largely effaced, with increased dilatation of the left lateral ventricle as compared to previous. Persistent crowding of the basilar cisterns is relatively similar. Subacute subarachnoid hemorrhage within the basilar cisterns is more prominent from previous, likely related to redistribution. Trace intraventricular hemorrhage is decreased from previous. No new intracranial hemorrhage. Now evident is a small focal evolving hypodensity within the paramedian right cerebellum, consistent with a small evolving ischemic infarct (series 3, image 8), new from previous. No other new or interval infarction. Vascular: Surgical clipping of right MCA bifurcation aneurysm. No appreciable asymmetric hyperdense vessel. Skull: Prior right pterional craniotomy. Overlying scalp swelling persists. Skin staples remain in place. Sinuses/Orbits: Globes and orbital soft tissues demonstrate no acute finding. Chronic mucoperiosteal thickening noted throughout the paranasal sinuses. Nasogastric tube partially visualized. No mastoid effusion. Other: None. IMPRESSION: 1. Continued interval evolution of right cerebral hemisphere hemorrhage, now less dense and decreased in size as compared to previous. Associated regional mass effect and edema with approximately 9 mm of right-to-left midline shift relatively unchanged. 2. Slightly decreased intraventricular and subarachnoid hemorrhage. No new intracranial hemorrhage. 3. Small evolving ischemic infarct within the paramedian right cerebellum, new from previous. 4. Postoperative changes from previous surgical clipping of right MCA  aneurysm. Electronically Signed   By: Jeannine Boga M.D.   On: 01/24/2019 00:49   Ct Head Wo Contrast  Result Date: 01/15/2019 CLINICAL DATA:  58 year old female status post surgical clipping of ruptured right MCA bifurcation aneurysm, postoperative day 3. EXAM: CT HEAD WITHOUT CONTRAST TECHNIQUE: Contiguous axial images were obtained from the base of the skull through the vertex without intravenous contrast. COMPARISON:  Head CTs 01/22/2019 and earlier. FINDINGS: Brain: Decreased pneumocephalus with small volume residual. Gas and hemorrhage at the right operculum. Superimposed confluent cytotoxic edema in the anterior right temporal lobe appears increased. Similar increased confluent hypodensity in the anterior right insula. Small volume intraventricular hemorrhage is stable. Small volume right convexity. Subarachnoid hemorrhage is stable. Improved basilar cistern  patency. But mass effect on the right lateral ventricle and leftward midline shift appear mildly increased (midline shift now 6 millimeters, previously 4 millimeters). Stable gray-white matter differentiation in the left hemisphere and posterior fossa. No ventriculomegaly. Vascular: Right MCA M1 region aneurysm clip with stable configuration. Skull: Stable. Right frontotemporal and orbital frontal craniotomy changes. There seems to be a superimposed nondisplaced right orbital roof fracture. No new osseous abnormality. Sinuses/Orbits: Left nasoenteric tube remains in place. Stable mild paranasal sinus mucosal thickening. Tympanic cavities and mastoids remain clear. Other: Stable postoperative changes to the scalp. Persistent right para but ULs oft tissue swelling but decreased right orbital gas. IMPRESSION: 1. Increased edema in the right temporal lobe and operculum since 01/09/2019 along with leftward midline shift, now 6 mm (previously 4 mm). At the same time basilar cistern patency appears mildly improved. 2. Decreased pneumocephalus. Stable  residual intracranial hemorrhage. No ventriculomegaly. 3. Stable right MCA region aneurysm clip. Electronically Signed   By: Genevie Ann M.D.   On: 01/15/2019 22:25   Ct Head Wo Contrast  Result Date: 01/05/2019 CLINICAL DATA:  Right MCA aneurysm clipping today.  Vasospasm. EXAM: CT HEAD WITHOUT CONTRAST TECHNIQUE: Contiguous axial images were obtained from the base of the skull through the vertex without intravenous contrast. COMPARISON:  CT head 01/18/2019 FINDINGS: Brain: Interval right-sided craniotomy. Aneurysm clip is present in the right middle cerebral artery region. Partial evacuation of the right-sided hematoma particularly the anterior portion. Edema surrounding the hematoma is similar to earlier today. There is now gas within the hematoma anteriorly related to dissection for aneurysm clipping. Anterior subdural pneumocephalus bilaterally. Small amount of blood layering in the left occipital horn unchanged. There is compression of the right lateral ventricle. 4 mm midline shift to the left similar to earlier today on the preoperative CT. Vascular: Right MCA aneurysm clipping. Skull: Right pterional craniotomy extending into the orbit on the right. There is gas in the right orbit. Sinuses/Orbits: Mild mucosal edema paranasal sinuses. Craniotomy extends into the right superior orbit. Moderate amount of gas within the orbit. NG tube on the left. Other: None IMPRESSION: 1. Postop right pterional craniotomy for right MCA aneurysm clipping. Hematoma in the right sylvian fissure has been partially evacuated. Surrounding edema unchanged. No new hemorrhage since earlier this morning. 4 mm midline shift similar to earlier today 2. Right pterional craniotomy extending into the right orbit with right orbital gas and soft tissue swelling. Electronically Signed   By: Franchot Gallo M.D.   On: 01/17/2019 18:35   Ct Head Wo Contrast  Result Date: 01/17/2019 CLINICAL DATA:  Follow-up intracranial hemorrhage EXAM: CT  HEAD WITHOUT CONTRAST TECHNIQUE: Contiguous axial images were obtained from the base of the skull through the vertex without intravenous contrast. COMPARISON:  Yesterday FINDINGS: Brain: Unchanged extent of the right temporal hematoma with adjacent subarachnoid hemorrhage and vasogenic edema, hematoma up to 4.5 cm anterior to posterior. There has been reflux of blood into the ventricles, reaching occipital horns, with stable mild dilatation on the left. Midline shift measures 5 mm. No superimposed infarct. Vascular: As per prior CTA. Skull: Negative Sinuses/Orbits: Bilateral cataract resection IMPRESSION: 1. Unchanged lobar and subarachnoid hemorrhage in the right temporal lobe and sylvian fissure. 2. Intraventricular redistribution of blood clot since prior. Stable mild left lateral ventriculomegaly. 3. 5 mm midline shift. Electronically Signed   By: Monte Fantasia M.D.   On: 01/09/2019 04:16   Ct Head Wo Contrast  Result Date: 01/25/2019 CLINICAL DATA:  Mental status change. Patient fell  2 days ago and was found down. Left-sided weakness and negligence. EXAM: CT HEAD WITHOUT CONTRAST TECHNIQUE: Contiguous axial images were obtained from the base of the skull through the vertex without intravenous contrast. COMPARISON:  Report only from CT head 12/20/2004. FINDINGS: Brain: There is a large acute intraparenchymal hematoma involving the right temporal and frontal lobes. This measures approximately 5.5 x 3.6 x 4.7 cm (volume = 49 cm^3). There is surrounding vasogenic edema and a small amount subarachnoid hemorrhage in the right sylvian fissure and along right convexity. There is 8 mm of right to left midline shift. There is impending uncal herniation and mild asymmetric dilatation of the left lateral ventricle. Vascular:  No hyperdense vessel identified. Skull: Negative for fracture or focal lesion. Sinuses/Orbits: The visualized paranasal sinuses and mastoid air cells are clear. No orbital abnormalities are seen.  Other: None. IMPRESSION: 1. Large intraparenchymal hematoma involving the right frontotemporal lobes with associated local mass effect, midline shift and early dilatation of the left lateral ventricle. There is some associated subarachnoid hemorrhage and impending uncal herniation. Emergent neuro surgical consultation recommended. 2. Critical Value/emergent results were called by telephone at the time of interpretation on 02/02/2019 at 5:15 pm to providerJORDAN ROBINSON , who verbally acknowledged these results. Electronically Signed   By: Richardean Sale M.D.   On: 01/22/2019 17:21   Ct Angio Neck W And/or Wo Contrast  Result Date: 01/05/2019 CLINICAL DATA:  Follow-up examination for acute intracranial hemorrhage. EXAM: CT ANGIOGRAPHY HEAD AND NECK TECHNIQUE: Multidetector CT imaging of the head and neck was performed using the standard protocol during bolus administration of intravenous contrast. Multiplanar CT image reconstructions and MIPs were obtained to evaluate the vascular anatomy. Carotid stenosis measurements (when applicable) are obtained utilizing NASCET criteria, using the distal internal carotid diameter as the denominator. CONTRAST:  58mL OMNIPAQUE IOHEXOL 350 MG/ML SOLN COMPARISON:  Prior head CT from earlier the same day. FINDINGS: CTA NECK FINDINGS Aortic arch: Visualized aortic arch of normal caliber with normal 3 vessel morphology. No hemodynamically significant stenosis seen about the origin of the great vessels. Visualized subclavian arteries widely patent. Right carotid system: Right common and internal carotid arteries widely patent without stenosis, dissection, or occlusion. Focal tortuosity of the distal right cervical ICA noted. Left carotid system: Left common and internal carotid arteries widely patent without stenosis, dissection, or occlusion. No significant atheromatous narrowing about the left carotid bifurcation. Mild tortuosity the proximal cervical left ICA noted. Vertebral  arteries: Both vertebral arteries arise from the subclavian arteries. Vertebral arteries widely patent within the neck without stenosis, dissection, or occlusion. Skeleton: No acute osseous abnormality. No discrete lytic or blastic osseous lesions. Degenerative osteoarthritic changes noted about the C1-2 articulation. Other neck: No other acute soft tissue abnormality within the neck. Upper chest: Multifocal parenchymal opacity seen within the visualized left upper and lower lobes, consistent with pneumonia. Visualized right lung is clear. Review of the MIP images confirms the above findings CTA HEAD FINDINGS Anterior circulation: Petrous, cavernous, and supraclinoid segments of the internal carotid arteries are widely patent without stenosis. ICA termini well perfused. A1 segments patent bilaterally. Subtle 2 mm focal outpouching arising from the anterior communicating artery complex suspicious for a small aneurysm (series 12, image 525). This is directed laterally to the right. Anterior cerebral arteries widely patent distally. Right M1 widely patent. There is an irregular aneurysm positioned at the right MCA bifurcation measuring 8.1 x 4.8 x 4.8 mm, consistent with source of acute intracranial hemorrhage (series 12, image 521). Right  MCA branches well perfused distally, although demonstrate diffuse irregularity, likely reflecting a degree of basal spasm. Left M1 widely patent. Normal left MCA bifurcation. Distal left MCA branches well perfused. Posterior circulation: Vertebral arteries diminutive but widely patent to the vertebrobasilar junction without stenosis. Posterior inferior cerebellar arteries patent proximally. Basilar diminutive but widely patent to its distal aspect. Superior cerebral arteries patent bilaterally. Predominant fetal type origin of the PCAs, supplied via a robust bilateral posterior communicating arteries. PCAs well perfused to their distal aspects without stenosis. Venous sinuses:  Grossly patent allowing for timing the contrast bolus. Anatomic variants: Predominant fetal type origin of the PCAs with overall diminutive vertebrobasilar system. Review of the MIP images confirms the above findings IMPRESSION: 1. 8.1 x 4.8 x 4.8 mm ruptured right MCA bifurcation aneurysm, consistent with the source of acute intracranial hemorrhage. 2. Additional 2 mm focal outpouching arising from the right aspect of the anterior communicating artery complex, suspicious for an additional small aneurysm. 3. Wide patency of the major arterial vasculature of the head and neck, with no hemodynamically significant or correctable stenosis. 4. Predominant fetal type origin of the PCAs with overall diminutive vertebrobasilar system. 5. Multifocal parenchymal opacity within the visualized left lung, consistent with pneumonia or possibly aspiration. Electronically Signed   By: Jeannine Boga M.D.   On: 01/20/2019 20:27   Mr Jeri Cos [id number] Contrast  Result Date: 01/24/2019 CLINICAL DATA:  Noncontrast head CT 01/23/2019 EXAM: MRI HEAD WITHOUT AND WITH CONTRAST TECHNIQUE: Multiplanar, multiecho pulse sequences of the brain and surrounding structures were obtained without and with intravenous contrast. CONTRAST:  81mL GADAVIST GADOBUTROL 1 MMOL/ML IV SOLN COMPARISON:  Noncontrast head CT 01/23/2019, CT angiogram head 01/16/2019, head CT 01/15/2019, CT angiogram head/neck 01/08/2019 FINDINGS: Brain: Large right cerebral hemorrhage with prominent surrounding vasogenic edema, not significantly changed in size as compared to head CT 01/23/2019. Small acute/subacute infarcts surrounding the hemorrhage within the right frontal and temporal lobes again suspected, however, evaluation is limited due to susceptibility artifact from the large hemorrhage and from the right-sided vascular clip. Persistent near complete effacement of the right lateral ventricle with 9 mm leftward midline shift. Asymmetric prominence of the left lateral  ventricle atrium and temporal horn, similar to prior, and concerning for entrapment. Right uncal herniation with mass effect upon the brainstem and severe effacement of the basilar cisterns, also present on prior exam. Redemonstrated trace intraventricular blood products. Diffuse SWI signal loss within the supratentorial and infratentorial sulci consistent with subarachnoid blood products and/or vasodilated vessels. There is T2 hyperintensity diffusely throughout the cerebral cortex as well as within the bilateral basal ganglia, and findings are suspicious for hypoxic ischemic injury. Significantly increased from prior exam, there is diffuse cerebellar swelling and T2 hyperintensity. Additionally, there is probable diffuse diffusion weighted signal abnormality throughout the cerebellum. Findings are also suspicious for sequela of hypoxic ischemic injury. Marked posterior fossa mass effect increased from prior exam and with cerebellar tonsillar herniation and mass effect upon the brainstem, as well as effacement of the fourth ventricle. Superimposed, there is a small acute/subacute infarct within the paramedian right cerebellum. Precontrast T1 hyperintensity within the large right cerebral hemorrhage limits evaluation for enhancement at this site. No abnormal enhancement is appreciated elsewhere within the brain. Vascular: Right MCA bifurcation aneurysm clip. Proximal flow voids poorly assessed due to severe intracranial mass effect. Skull and upper cervical spine: Sequela of prior right pterional craniotomy. Sinuses/Orbits: Visualized orbits demonstrate no acute abnormality. Mild scattered paranasal sinus mucosal thickening. Small bilateral mastoid effusions.  These results were called by telephone at the time of interpretation on 01/24/2019 at 3:20 pm to provider Dr. Ashok Pall, Who verbally acknowledged these results. IMPRESSION: 1. Diffuse signal abnormality throughout the cerebral cortex and within the basal  ganglia. Diffuse signal abnormality and suspected diffuse diffusion abnormality throughout the cerebellum. Findings likely reflect hypoxic ischemic injury. 2. Significant interval increase in cerebellar swelling and posterior fossa mass effect with new cerebellar tonsillar herniation and mass effect upon the brainstem, and effacement of the fourth ventricle. Superimposed small acute/subacute infarct within the paramedian right cerebellum. 3. Large right cerebral hemorrhage with prominent surrounding vasogenic edema, 9 mm leftward midline shift, right uncal herniation with mass effect upon the brainstem, and suspected entrapment of the left lateral ventricle. Findings are similar to prior CT. Suspected acute/subacute infarcts within the right frontal and temporal lobe surrounding the hemorrhage. 4. Diffuse SWI signal loss within the supratentorial and infratentorial sulci consistent with subarachnoid blood products and or vasodilated vessels. Electronically Signed   By: Kellie Simmering   On: 01/24/2019 15:34   Dg Chest Port 1 View  Result Date: 01/23/2019 CLINICAL DATA:  Left-sided central line placement EXAM: PORTABLE CHEST 1 VIEW COMPARISON:  01/23/2019, 6:11 a.m. FINDINGS: Unchanged AP portable examination. Support apparatus includes endotracheal tube, partially imaged enteric tube, and left subclavian vascular catheter, in unchanged position. No acute abnormality of the lungs in AP portable projection. The heart and mediastinum are unremarkable. IMPRESSION: 1. Unchanged AP portable examination. Support apparatus includes endotracheal tube, partially imaged enteric tube, and left subclavian vascular catheter, in unchanged position. 2.  No acute abnormality of the lungs in AP portable projection. Electronically Signed   By: Eddie Candle M.D.   On: 01/23/2019 17:18   Dg Chest Port 1 View  Result Date: 01/23/2019 CLINICAL DATA:  Respiratory failure. EXAM: PORTABLE CHEST 1 VIEW COMPARISON:  01/19/2019. FINDINGS:  Lung base opacities have improved.  Lungs now clear. Endotracheal tube, enteric tube and left subclavian central venous line are stable. No pneumothorax. IMPRESSION: 1. Resolved basilar atelectasis. 2. No acute cardiopulmonary disease. 3. Stable support apparatus. Electronically Signed   By: Lajean Manes M.D.   On: 01/23/2019 07:37   Dg Chest Port 1 View  Result Date: 01/19/2019 CLINICAL DATA:  Encounter for central line care EXAM: PORTABLE CHEST 1 VIEW COMPARISON:  Two days ago FINDINGS: Endotracheal tube tip just below the clavicular heads. The feeding tube at least reaches the distal stomach. Left subclavian line with tip at the SVC. Artifact from EKG leads. Indistinct opacification in the lower lungs. No edema, effusion, or pneumothorax. Normal heart size. IMPRESSION: 1. Unremarkable hardware positioning. 2. Stable atelectasis or infiltrate in the lower lungs. Electronically Signed   By: Monte Fantasia M.D.   On: 01/19/2019 05:51   Dg Chest Port 1 View  Result Date: 01/06/2019 CLINICAL DATA:  Hypoxia EXAM: PORTABLE CHEST 1 VIEW COMPARISON:  01/15/2019 FINDINGS: Support devices are stable. Low lung volumes. Minimal right base atelectasis. Heart is normal size. Left lung clear. No effusions or acute bony abnormality. IMPRESSION: Low lung volumes, right base atelectasis. Electronically Signed   By: Rolm Baptise M.D.   On: 01/08/2019 09:50   Dg Chest Port 1 View  Result Date: 01/15/2019 CLINICAL DATA:  Evaluate cardiopulmonary status. EXAM: PORTABLE CHEST 1 VIEW COMPARISON:  01/14/2019 FINDINGS: Enteric tube courses into the stomach and off the film as tip is not visualized. Left subclavian central venous catheter and endotracheal tubes are unchanged. Lungs are adequately inflated as patient is  slightly rotated to the right. There is no lobar consolidation, effusion or pneumothorax. Slight prominence of the left perihilar vessels. Cardiomediastinal silhouette and remainder of the exam is unchanged.  IMPRESSION: Possible minimal vascular congestion, otherwise no acute disease. Tubes and lines as described. Electronically Signed   By: Marin Olp M.D.   On: 01/15/2019 08:52   Dg Chest Portable 1 View  Result Date: 01/14/2019 CLINICAL DATA:  58 year old female with respiratory failure. Status post intubation. EXAM: PORTABLE CHEST 1 VIEW COMPARISON:  Chest radiograph date 01/14/2019 FINDINGS: Endotracheal tube with tip approximately 3 cm above the carina. Left subclavian central venous line with tip over cavoatrial junction. Interval removal of the enteric tube and placement of a feeding tube with tip beyond the inferior margin of the image. No interval change in the appearance of the lungs. No focal consolidation or pneumothorax. Stable cardiac silhouette. Slight prominence of the hilar vessels may represent a degree of pulmonary hypertension. No acute osseous pathology. IMPRESSION: 1. Endotracheal tube above the carina. Left subclavian central venous line with tip at the cavoatrial junction. 2. No interval change in the appearance of the lungs. Electronically Signed   By: Anner Crete M.D.   On: 01/14/2019 16:47   Dg Chest Port 1 View  Result Date: 01/14/2019 CLINICAL DATA:  Follow-up CVA. EXAM: PORTABLE CHEST 1 VIEW COMPARISON:  Multiple prior chest films. The most recent is 01/06/2019 FINDINGS: There is a new NG tube coursing down the esophagus and into the stomach. The cardiac silhouette, mediastinal and hilar contours are stable given the AP projection and portable technique. Low lung volumes with vascular crowding and streaky atelectasis. IMPRESSION: 1. Low lung volumes with vascular crowding and atelectasis. 2. NG tube in good position. Electronically Signed   By: Marijo Sanes M.D.   On: 01/14/2019 08:43   Dg Chest Port 1 View  Result Date: 01/12/2019 CLINICAL DATA:  Found down. EXAM: PORTABLE CHEST 1 VIEW COMPARISON:  06/11/2010 FINDINGS: The cardiac silhouette, mediastinal and hilar  contours are within normal limits. Mild tortuosity of the thoracic aorta. Patchy E left upper lobe and right basilar atelectasis. No definite infiltrates or effusions. The bony structures are intact. IMPRESSION: Streaky left upper lobe and right basilar atelectasis. Electronically Signed   By: Marijo Sanes M.D.   On: 01/27/2019 16:54   Dg Abd Portable 1v  Result Date: 01/20/2019 CLINICAL DATA:  OG tube placement EXAM: PORTABLE ABDOMEN - 1 VIEW COMPARISON:  06/13/2010 FINDINGS: OG tube tip is in the mid stomach. Nonobstructive bowel gas pattern. IMPRESSION: OG tube tip in the mid stomach. Electronically Signed   By: Rolm Baptise M.D.   On: 01/21/2019 21:28   Vas [place] Transcranial Doppler  Result Date: 01/25/2019  Transcranial Doppler Indications: Subarachnoid hemorrhage. Limitations for diagnostic windows: Unable to insonate right transtemporal window. Unable to insonate left transtemporal window. Comparison Study: 01/21/19 Performing Technologist: Oliver Hum RVT  Examination Guidelines: A complete evaluation includes B-mode imaging, spectral Doppler, color Doppler, and power Doppler as needed of all accessible portions of each vessel. Bilateral testing is considered an integral part of a complete examination. Limited examinations for reoccurring indications may be performed as noted.  +----------+-------------+----------+-----------+-------+ RIGHT TCD Right VM (cm)Depth (cm)PulsatilityComment +----------+-------------+----------+-----------+-------+ Opthalmic     13.00                 1.16            +----------+-------------+----------+-----------+-------+ ICA siphon    11.00  0.63            +----------+-------------+----------+-----------+-------+ Vertebral     -5.00                 2.78            +----------+-------------+----------+-----------+-------+  +----------+------------+----------+-----------+-------+ LEFT TCD  Left VM (cm)Depth  (cm)PulsatilityComment +----------+------------+----------+-----------+-------+ Opthalmic    16.00                 1.12            +----------+------------+----------+-----------+-------+ ICA siphon   22.00                 1.12            +----------+------------+----------+-----------+-------+ Vertebral    -9.00                 2.74            +----------+------------+----------+-----------+-------+  +------------+-------+-------+             VM cm/sComment +------------+-------+-------+ Prox Basilar -7.00  2.67   +------------+-------+-------+ Summary:  Absentbitemporal and poor occipital windows greatly limits this study.Normalmean flow velocities in bilateral opthalmics, carotid siphons and lowmean flow velocities in both vertebrals and basilar arteries of unclear significance. No definite evidence of  vasopsam noted.Severely increased pulsatility indices with damped waveforms in both vertebrals and basilar arteries suggest severe increase in intracranial pressure. *See table(s) above for measurements and observations.  Diagnosing physician: Antony Contras MD Electronically signed by Antony Contras MD on 01/25/2019 at 8:17:46 AM.    Final    Vas [place] Transcranial Doppler  Result Date: 01/25/2019  Transcranial Doppler Indications: Subarachnoid hemorrhage. Limitations for diagnostic windows: Unable to insonate right transtemporal window. Unable to insonate left transtemporal window. Performing Technologist: Maudry Mayhew MHA, RDMS, RVT, RDCS  Examination Guidelines: A complete evaluation includes B-mode imaging, spectral Doppler, color Doppler, and power Doppler as needed of all accessible portions of each vessel. Bilateral testing is considered an integral part of a complete examination. Limited examinations for reoccurring indications may be performed as noted.  +----------+-------------+----------+-----------+-------+ RIGHT TCD Right VM (cm)Depth (cm)PulsatilityComment  +----------+-------------+----------+-----------+-------+ Opthalmic     22.00                 1.18            +----------+-------------+----------+-----------+-------+ ICA siphon    42.00                 1.49            +----------+-------------+----------+-----------+-------+ Vertebral    -56.00                 1.44            +----------+-------------+----------+-----------+-------+  +----------+------------+----------+-----------+------------------+ LEFT TCD  Left VM (cm)Depth (cm)Pulsatility     Comment       +----------+------------+----------+-----------+------------------+ Opthalmic    21.00                 1.36                       +----------+------------+----------+-----------+------------------+ ICA siphon   18.00                 1.58                       +----------+------------+----------+-----------+------------------+ Vertebral  Unable to insonate +----------+------------+----------+-----------+------------------+  Summary:  Limited study due to basent bitemporal and pooor occipital windows. Normal mean flow velocities in bilateral opthalmics, carotid siphons and right vertebral arteries .No definite vasospasm noted. globally elevated pulsatility indices may suggest generalized increase in intracranial pressure. *See table(s) above for measurements and observations.  Diagnosing physician: Antony Contras MD Electronically signed by Antony Contras MD on 01/25/2019 at 8:13:46 AM.    Final    Vas [place] Transcranial Doppler  Result Date: 01/19/2019  Transcranial Doppler Indications: Subarachnoid hemorrhage. Comparison Study: 01/11/2019 Performing Technologist: Abram Sander RVS  Examination Guidelines: A complete evaluation includes B-mode imaging, spectral Doppler, color Doppler, and power Doppler as needed of all accessible portions of each vessel. Bilateral testing is considered an integral part of a complete examination. Limited  examinations for reoccurring indications may be performed as noted.  +----------+-------------+----------+-----------+-------+ RIGHT TCD Right VM (cm)Depth (cm)PulsatilityComment +----------+-------------+----------+-----------+-------+ Term ICA      38.00                 1.32            +----------+-------------+----------+-----------+-------+ Opthalmic     40.00                 1.39            +----------+-------------+----------+-----------+-------+ ICA siphon    22.00                 1.31            +----------+-------------+----------+-----------+-------+  +----------+------------+----------+-----------+-------+ LEFT TCD  Left VM (cm)Depth (cm)PulsatilityComment +----------+------------+----------+-----------+-------+ Opthalmic    12.00                 1.52            +----------+------------+----------+-----------+-------+ ICA siphon   44.00                 1.19            +----------+------------+----------+-----------+-------+  Summary:  Absent bitemporal windows limits evaluation of anterior circulation bilaterally. Normal mean flow velocities in bilateral terminal ICAs and opthalmic arteries *See table(s) above for measurements and observations.  Diagnosing physician: Antony Contras MD Electronically signed by Antony Contras MD on 01/19/2019 at 4:45:21 PM.    Final    Vas [place] Transcranial Doppler  Result Date: 01/30/2019  Transcranial Doppler Indications: Subarachnoid hemorrhage. Limitations: bandages/ incisions Limitations for diagnostic windows: Unable to insonate right transtemporal window. Unable to insonate left transtemporal window. Comparison Study: 01/16/19 Performing Technologist: June Leap RDMS, RVT  Examination Guidelines: A complete evaluation includes B-mode imaging, spectral Doppler, color Doppler, and power Doppler as needed of all accessible portions of each vessel. Bilateral testing is considered an integral part of a complete examination. Limited  examinations for reoccurring indications may be performed as noted.  +----------+-------------+----------+-----------+-------+ RIGHT TCD Right VM (cm)Depth (cm)PulsatilityComment +----------+-------------+----------+-----------+-------+ Opthalmic     30.00                 1.27            +----------+-------------+----------+-----------+-------+ ICA siphon    52.00                 1.24            +----------+-------------+----------+-----------+-------+  +----------+------------+----------+-----------+-------+ LEFT TCD  Left VM (cm)Depth (cm)PulsatilityComment +----------+------------+----------+-----------+-------+ Opthalmic    24.00                 1.59            +----------+------------+----------+-----------+-------+  ICA siphon   76.00                 1.04            +----------+------------+----------+-----------+-------+  +------------+-------+-------------+             VM cm/s   Comment    +------------+-------+-------------+ Prox Basilar       not insonated +------------+-------+-------------+ Dist Basilar       not insonated +------------+-------+-------------+ Summary:  Highly limited study due to absent bitemporal and occipital windows.Normal mean flow velocities and blood flow directions in both opthalmics andcarotid siphons. *See table(s) above for measurements and observations.  Diagnosing physician: Antony Contras MD Electronically signed by Antony Contras MD on 01/08/2019 at 3:39:43 PM.    Final    Vas [place] Transcranial Doppler  Result Date: 01/16/2019  Transcranial Doppler Indications: Subarachnoid hemorrhage. Limitations for diagnostic windows: Unable to insonate left transtemporal window. Comparison Study: pervious study done 01/14/19 Performing Technologist: Abram Sander RVS  Examination Guidelines: A complete evaluation includes B-mode imaging, spectral Doppler, color Doppler, and power Doppler as needed of all accessible portions of each vessel.  Bilateral testing is considered an integral part of a complete examination. Limited examinations for reoccurring indications may be performed as noted.  +----------+-------------+----------+-----------+-------+ RIGHT TCD Right VM (cm)Depth (cm)PulsatilityComment +----------+-------------+----------+-----------+-------+ MCA           43.00                 1.01            +----------+-------------+----------+-----------+-------+ Term ICA      43.00                 1.23            +----------+-------------+----------+-----------+-------+ Opthalmic     25.00                 1.21            +----------+-------------+----------+-----------+-------+ ICA siphon    37.00                 1.04            +----------+-------------+----------+-----------+-------+  +----------+------------+----------+-----------+-------+ LEFT TCD  Left VM (cm)Depth (cm)PulsatilityComment +----------+------------+----------+-----------+-------+ Opthalmic    17.00                 1.57            +----------+------------+----------+-----------+-------+ ICA siphon   51.00                 1.22            +----------+------------+----------+-----------+-------+  Summary:  Absent left tempral and occipital windows limit evaluation. Normal mean flow velocities in right middle cerebral, bilateral opthalmics and carotid siphons.mildly elevated pulsatility indices suggest diffuse intracranial atherosclerosis or increased intracranial pressure *See table(s) above for measurements and observations.  Diagnosing physician: Antony Contras MD Electronically signed by Antony Contras MD on 01/16/2019 at 12:09:07 PM.    Final    Vas [place] Transcranial Doppler  Result Date: 01/15/2019  Transcranial Doppler Indications: Subarachnoid hemorrhage. Limitations for diagnostic windows: Unable to insonate right transtemporal window.- due to bandages. Or Left siphon, bilateral vertebral Comparison Study: no prior Performing  Technologist: June Leap RDMS, RVT  Examination Guidelines: A complete evaluation includes B-mode imaging, spectral Doppler, color Doppler, and power Doppler as needed of all accessible portions of each vessel. Bilateral testing is considered an integral part of a complete examination. Limited examinations for reoccurring  indications may be performed as noted.  +----------+-------------+----------+-----------+-------+ RIGHT TCD Right VM (cm)Depth (cm)PulsatilityComment +----------+-------------+----------+-----------+-------+ Opthalmic     33.00                   1             +----------+-------------+----------+-----------+-------+ ICA siphon    48.00                 0.92            +----------+-------------+----------+-----------+-------+  +---------+------------+----------+-----------+-------+ LEFT TCD Left VM (cm)Depth (cm)PulsatilityComment +---------+------------+----------+-----------+-------+ Opthalmic   36.00                   1             +---------+------------+----------+-----------+-------+  +------------+-------+-------+             VM cm/sComment +------------+-------+-------+ Prox Basilar-27.00         +------------+-------+-------+ Dist Basilar-35.00         +------------+-------+-------+ Summary:  Absent bitemporal and left orbital windows greatly limits study. Normal mean flow velocities and pulsatility indices in right opthalmic and carotid siphon, basilar and left opthalmic artries. No vasospasm noted in identified vessels. *See table(s) above for measurements and observations.  Diagnosing physician: Antony Contras MD Electronically signed by Antony Contras MD on 01/15/2019 at 1:21:11 PM.    Final    [place] Ekg Site Rite  Result Date: 01/23/2019 If Site Rite image not attached, placement could not be confirmed due to current cardiac rhythm.  [place] Ekg Site Rite  Result Date: 01/14/2019 If Site Rite image not attached, placement could not be confirmed due  to current cardiac rhythm.  Ir Angio Intra Extracran Sel Internal Carotid Bilat Mod Sed  Result Date: 01/13/2019 PROCEDURE: DIAGNOSTIC CEREBRAL ANGIOGRAM HISTORY: The patient is a 58 year old woman presenting to the hospital after being found down. 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 The patient underwent surgical clipping of the aneurysm with partial evacuation of the hematoma. She was noted to have change in neurologic condition with decreased movement of the right side. CT angiogram suggested possibility of vasospasm. She therefore presents for diagnostic cerebral angiogram with possible angioplasty. ACCESS: The technical aspects of the procedure as well as its potential risks and benefits were reviewed with the patient's family. These risks included but were not limited bleeding, infection, allergic reaction, damage to organs or vital structures, stroke, non-diagnostic procedure, and the catastrophic outcomes of heart attack, coma, and death. With an understanding of these risks, informed consent was obtained and witnessed. The patient was placed in the supine position on the angiography table and the skin of right groin prepped in the usual sterile fashion. The procedure was performed under general anesthesia by the anesthesia service. A 5- French sheath was introduced in the right common femoral artery using Seldinger technique. A fluoro-phase sequence was used to document the sheath position. MEDICATIONS: HEPARIN: 2000 Units total. CONTRAST:  cc, Omnipaque 300 FLUOROSCOPY TIME:  FLUOROSCOPY TIME: See IR records TECHNIQUE: CATHETERS AND WIRES 5-French JB-1 catheter 0.035" glidewire VESSELS CATHETERIZED Right internal carotid Left internal carotid Left vertebral Right vertebral Right common femoral VESSELS STUDIED Right internal carotid, head Left internal carotid, head Left  vertebral Right vertebral Right femoral PROCEDURAL NARRATIVE A 5-Fr JB-1 catheter was advanced over a 0.035 glidewire into the aortic arch. The above vessels were then sequentially catheterized and cervical /  cerebral angiograms taken. After review of images, the catheter was removed without incident. FINDINGS: Right internal carotid: head: Injection reveals the presence of a widely patent ICA, M1, and A1 segments and their branches. Incidental note is made of a fetal type right PCA. There is perhaps a minimal vasospasm involving the M1 segment, however no high-grade stenosis or flow limitation is seen. There does not appear to be any identifiable distal small vessel vasospasm. Surgical clip is seen in the region of the right middle cerebral artery bifurcation. 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 The inferiorly directed M2 segment has a loop just after its origin, while the aneurysm residual is 1-2 mm tall. This is actually better visualized on three-dimensional reconstructed CT angiogram done yesterday. The parenchymal and venous phases are normal. The venous sinuses are widely patent. Left internal carotid: head: Injection reveals the presence of a widely patent ICA, A1, and M1 segments and their branches. No aneurysms, AVMs, or high-flow fistulas are seen. There is no significant angiographic vasospasm seen. The parenchymal and venous phases are normal. The venous sinuses are widely patent. Right vertebral: Injection reveals the presence of a widely patent vertebral artery. This leads to a widely patent basilar artery that terminates in left P1. The basilar apex is normal. No significant vasospasm is seen involving the posterior circulation. The parenchymal and venous phases are normal. The venous sinuses are widely patent. Left vertebral: The vertebral artery is widely patent. No PICA aneurysm is seen. See basilar  description above. Right femoral: Normal vessel. No significant atherosclerotic disease. Arterial sheath in adequate position. DISPOSITION: Upon completion of the study, the femoral sheath was removed and hemostasis obtained using a 5-Fr ExoSeal closure device. Good proximal and distal lower extremity pulses were documented upon achievement of hemostasis. The procedure was well tolerated and no early complications were observed. The patient was transferred back to the ICU for further care. IMPRESSION: 1. No significant intracranial vasospasm is identified, with perhaps mild vasospasm involving the right M1 segment. There is no high-grade stenosis or flow limitation. 2. 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 The preliminary results of this procedure were shared with the patient and the patient's family. Electronically Signed   By: Consuella Lose   On: 01/05/2019 13:17   Ir Angio Vertebral Sel Vertebral Bilat Mod Sed  Result Date: 01/23/2019 PROCEDURE: DIAGNOSTIC CEREBRAL ANGIOGRAM HISTORY: The patient is a 58 year old woman presenting to the hospital after being found down. 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 The patient underwent surgical clipping of the aneurysm with partial evacuation of the hematoma. She was noted to have change in neurologic condition with decreased movement of the right side. CT angiogram suggested possibility of vasospasm. She therefore presents for diagnostic cerebral angiogram with possible angioplasty. ACCESS: The technical aspects of the procedure as well as its potential risks and benefits were reviewed with the patient's family. These risks included but were not limited bleeding, infection, allergic reaction, damage to organs or vital  structures, stroke, non-diagnostic procedure, and the catastrophic outcomes of heart attack, coma, and death. With an understanding of these risks, informed consent was obtained and witnessed. The patient was placed in the supine position on the angiography table and the skin of right groin prepped in the  usual sterile fashion. The procedure was performed under general anesthesia by the anesthesia service. A 5- French sheath was introduced in the right common femoral artery using Seldinger technique. A fluoro-phase sequence was used to document the sheath position. MEDICATIONS: HEPARIN: 2000 Units total. CONTRAST:  cc, Omnipaque 300 FLUOROSCOPY TIME:  FLUOROSCOPY TIME: See IR records TECHNIQUE: CATHETERS AND WIRES 5-French JB-1 catheter 0.035" glidewire VESSELS CATHETERIZED Right internal carotid Left internal carotid Left vertebral Right vertebral Right common femoral VESSELS STUDIED Right internal carotid, head Left internal carotid, head Left vertebral Right vertebral Right femoral PROCEDURAL NARRATIVE A 5-Fr JB-1 catheter was advanced over a 0.035 glidewire into the aortic arch. The above vessels were then sequentially catheterized and cervical / cerebral angiograms taken. After review of images, the catheter was removed without incident. FINDINGS: Right internal carotid: head: Injection reveals the presence of a widely patent ICA, M1, and A1 segments and their branches. Incidental note is made of a fetal type right PCA. There is perhaps a minimal vasospasm involving the M1 segment, however no high-grade stenosis or flow limitation is seen. There does not appear to be any identifiable distal small vessel vasospasm. Surgical clip is seen in the region of the right middle cerebral artery bifurcation. 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 The inferiorly directed M2 segment has a loop just after its origin, while the aneurysm  residual is 1-2 mm tall. This is actually better visualized on three-dimensional reconstructed CT angiogram done yesterday. The parenchymal and venous phases are normal. The venous sinuses are widely patent. Left internal carotid: head: Injection reveals the presence of a widely patent ICA, A1, and M1 segments and their branches. No aneurysms, AVMs, or high-flow fistulas are seen. There is no significant angiographic vasospasm seen. The parenchymal and venous phases are normal. The venous sinuses are widely patent. Right vertebral: Injection reveals the presence of a widely patent vertebral artery. This leads to a widely patent basilar artery that terminates in left P1. The basilar apex is normal. No significant vasospasm is seen involving the posterior circulation. The parenchymal and venous phases are normal. The venous sinuses are widely patent. Left vertebral: The vertebral artery is widely patent. No PICA aneurysm is seen. See basilar description above. Right femoral: Normal vessel. No significant atherosclerotic disease. Arterial sheath in adequate position. DISPOSITION: Upon completion of the study, the femoral sheath was removed and hemostasis obtained using a 5-Fr ExoSeal closure device. Good proximal and distal lower extremity pulses were documented upon achievement of hemostasis. The procedure was well tolerated and no early complications were observed. The patient was transferred back to the ICU for further care. IMPRESSION: 1. No significant intracranial vasospasm is identified, with perhaps mild vasospasm involving the right M1 segment. There is no high-grade stenosis or flow limitation. 2. 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 The preliminary results of this procedure were shared with the patient and the patient's family. Electronically Signed   By: Consuella Lose   On: 01/19/2019  13:17    Microbiology No results found for this or any previous visit (from the past 240 hour(s)).  Lab Basic Metabolic Panel: Recent Labs  Lab 01/25/19 1941 01/25/19 2336 February 07, 2019 0516 2019/02/07 1033 2019/02/07 1433  NA 148* 147* 144 146* 143  K 3.7 3.4* 3.2* 3.4* 4.2  CL 117* 116* 112* 112* 113*  CO2 24 24 23  26  24  GLUCOSE 201* 154* 164* 100* 142*  BUN <5* <5* <5* <5* <5*  CREATININE 0.50 0.43* 0.47 0.46 0.49  CALCIUM 7.8* 7.9* 8.1* 8.4* 7.9*  MG  --   --  1.6*  --   --   PHOS  --   --  3.3  --   --    Liver Function Tests: No results for input(s): AST, ALT, ALKPHOS, BILITOT, PROT, ALBUMIN in the last 168 hours. No results for input(s): LIPASE, AMYLASE in the last 168 hours. No results for input(s): AMMONIA in the last 168 hours. CBC: Recent Labs  Lab 01/25/19 1330 January 30, 2019 0516  WBC  --  15.6*  NEUTROABS  --  13.3*  HGB 7.7* 7.3*  HCT 24.9* 23.7*  MCV  --  91.2  PLT  --  298   Cardiac Enzymes: No results for input(s): CKTOTAL, CKMB, CKMBINDEX, TROPONINI in the last 168 hours. Sepsis Labs: Recent Labs  Lab 01-30-19 0516  WBC 15.6*    Procedures/Operations  1.  Right frontotemporal craniotomy for evacuation of hematoma and clipping of right middle cerebral artery aneurysm 2.  Diagnostic cerebral angiogram   Jairo Ben 02/01/2019, 12:06 PM

## 2019-02-03 NOTE — Progress Notes (Signed)
PULMONARY / CRITICAL CARE MEDICINE   NAME:  Jessica Mayer, MRN:  [id number], DOB:  [birth year], LOS: 47 ADMISSION DATE:  01/27/2019, CONSULTATION DATE: 01/11/2019 REFERRING MD: Neurosurgery, CHIEF COMPLAINT: Vent dependent respiratory failure secondary to right MCA clipping and craniotomy  BRIEF HISTORY:    58 year old female with a history of noncompliance with hypertensive medication who had altered mental status for 48 hours prior to admission on 01/08/2019 found to have a right MCA aneurysm was taken to surgery on 01/08/2019 left intubated pulmonary critical care consulted. Hospital course c/b VAP, seizures, vasospasm  Was improving until overnight on 9/20 when had an acute worsening of her neurologic status; subsequent MRI showed R uncal and cerebellar tonsillar herniation with mass effect on the brainstem.  She has remained unresponsive.  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  AEDs initiated. 9/11-9/13 Received DDAVP 01/15/2019 -blood pressure augmentation for suspected vasospasm 9/13 DDAVP stopped 9/18 Weaning from Vent 9/19 change to pressure control. Remains on pressors, IV fluid. 9/21 acute worsening of neuro status, new cerebellar CVA on CT 9/22 R uncal cerebellar tonsillar herniation with mass effect on the brainstem  STUDIES:   01/25/2019 CT scans revealing a right MCA aneurysm and hematoma 01/13/2019:  ECHO: EF 65%, normal RV, mild MV Regurg 01/15/2019 CT scan shows stable residual hematoma but increased surrounding edema. 9/13 CT head: no additional ICH, suspicion of diffuse intracranial spasm 9/14 Diagnostic Cerebral Angiogram: No significant  intracranial vasospasm, perhaps mild vasospasm involving the R M1 segment 9/14 TCD: limited study, normal flow  9/16 TCD: limited study, normal flow 9/20 CT head: Small evolving ischemic infarct within the paramedian right cerebellum, new from previous. 9/21: Significant interval increase in cerebellar swelling and posterior fossa mass effect with new cerebellar tonsillar herniation and mass effect upon the brainstem, and effacement of the fourth ventricle. Superimposed small acute/subacute infarct within the paramedian right cerebellum.  CULTURES:  MRSA>>negative SARS-CoV-2>> negative 01/06/2019: Blood Cx>>negative 01/16/2019: Respiratory Cx>> Staph Aureus and Enterobacter>>Staph pan-sensitive, Enterobacter resistant to cefazolin 01/18/19: C Diff>>neg  ANTIBIOTICS:  Vanc 9/14>>9/16 Zosyn 9/14>>9/16 Ceftriaxone 9/16>>9/21  LINES/TUBES:  01/06/2019 endotracheal tube>> 01/31/2019; 9/11>> 9/11 CVC L subclavian>> 9/13 Arterial Line>>  CONSULTANTS:  Neurosurgery Neurology  SUBJECTIVE:  Family changed code status to DNR, but want to continue supportive care at this time. No change in patient's status overnight    CONSTITUTIONAL: BP 122/68   Pulse (!) 126   Temp 97.7 F (36.5 C) (Axillary)   Resp 18   Ht 5\' 5"  (1.651 m)   Wt 99.1 kg   LMP 05/29/2010   SpO2 92%   BMI 36.36 kg/m   I/O last 3 completed shifts: In: 22501.7 [I.V.:18214.7; SX:1911716; DR:6187998; IV Piggyback:162] Out: [id number] [Urine:12895; Stool:625]     Vent Mode: PRVC FiO2 (%):  [40 %-100 %] 40 % Set Rate:  [18 bmp] 18 bmp Vt Set:  [450 mL] 450 mL PEEP:  [5 cmH20] 5 cmH20 Plateau Pressure:  [19 [id number] cmH20] 23 cmH20     General:  Intubated, unresponsive HEENT: MM pink/moist, L pupil 9mm, R pupil 3mm both unresponsive to light, no doll's eye reflex Neuro: Does not respond to pain, still triggering breaths, RR 11, no posturing CV: s1s2 tachycardic, regular, no m/r/g  PULM:  Intubated, full vent support,  decreased breath sounds bilateral bases GI: soft, bsx4 active  Extremities: warm/dry, trace edema  Skin: no rashes or lesions    RESOLVED PROBLEM LIST  Status Epilepticus, Hyponatremia, Pneumonia  ASSESSMENT AND PLAN    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 -very unfortunate development and pt is unresponsive -per NS, picture is consistent with global metabolic or toxic/ischemic event, etiology unclear -prognosis very poor  P: -likely will progress to brain death, now DNR per family but want to continue life support at this time -Continue full Vent support with VAP bundle and titrate to FiO2> 94%     R MCA aneurysm causing Subarachnoid Hemorrhage, s/p R MCA aneurysm clipping and subsequent mild M1 vasospasm; persistent encephalopathy -presenting problem, neuro exam was improving until 9/20 P: -Continue to treat DI with DDAVP and     Central Diabetes Insipidus ->7L UOP yesterday with decreased urine osms -received 57mcg DDAVP -Na 144 this morning, Mag 1.6 P: -Continue IVF support and DDAVP 31mcg q12hrs -Mag 2g IV  Anemia of Chronic Illness -Hgb drift likely due to critical illness P: -Hgb 7.3 s/p 1 unit PRBC's   Seizures -initially with status, this has resolved and no longer requiring continuous monitoring -Neurology following P: -continue Keppra and Phenytoin     Hyperglycemia  -SSI and Basal insulin       BEST PRACTICE:  DVT PROPHYLAXIS: heparin SUP: PPI NUTRITION: N.p.o -tube feeds. MOBILITY: Bedrest GOALS OF CARE: Full code DISPOSITION: ICU on mechanical ventilation Family: per primary service    LABS    PULMONARY Recent Labs  Lab 01/22/19 1029  PHART 7.471*  PCO2ART 36.1  PO2ART 63.0*  HCO3 26.2  TCO2 27  O2SAT 93.0    CBC Recent Labs  Lab 01/24/19 0047 01/25/19 0356 01/25/19 1330 2019/01/31 0516  HGB 8.0* 6.8* 7.7* 7.3*  HCT 26.0* 21.9* 24.9* 23.7*  WBC 16.7* 16.1*  --  15.6*  PLT 588* 368  --   298    COAGULATION Recent Labs  Lab 01/24/19 0047  INR 1.1    CARDIAC  No results for input(s): TROPONINI in the last 168 hours. No results for input(s): PROBNP in the last 168 hours.   CHEMISTRY Recent Labs  Lab 01/20/19 0945  01/22/19 0548  01/24/19 0047  01/25/19 0356  01/25/19 1330 01/25/19 1833 01/25/19 1941 01/25/19 2336 2019-01-31 0516  NA 145   < > 141   < >  --    < > 159*   < > 153* 137 148* 147* 144  K 3.4*   < > 3.8   < >  --    < > 4.3   < > 3.5 >7.5* 3.7 3.4* 3.2*  CL 109   < > 105   < >  --    < > >130*   < > 121* 117* 117* 116* 112*  CO2 23   < > 23   < >  --    < > 21*   < > 25 22 24 24 23   GLUCOSE 141*   < > 147*   < >  --    < > 183*   < > 125* 154* 201* 154* 164*  BUN 6   < > 6   < >  --    < > 14   < > <5* <5* <5* <5* <5*  CREATININE 0.49   < > 0.46   < >  --    < >  0.70   < > 0.43* 0.34* 0.50 0.43* 0.47  CALCIUM 8.4*   < > 8.7*   < >  --    < > 8.2*   < > 8.0* 6.6* 7.8* 7.9* 8.1*  MG 2.2  --  2.3  --  2.3  --  2.4  --   --   --   --   --  1.6*  PHOS 2.8  --   --   --  3.6  --  4.2  --   --   --   --   --  3.3   < > = values in this interval not displayed.   Estimated Creatinine Clearance: 87.1 mL/min (by C-G formula based on SCr of 0.47 mg/dL).   LIVER Recent Labs  Lab 01/21/19 0643 01/23/19 1326 01/24/19 0047  AST  --  258* 330*  ALT  --  199* 259*  ALKPHOS  --  372* 434*  BILITOT  --  1.0 1.1  PROT  --  5.9* 7.0  ALBUMIN 2.1* 1.8* 2.3*  INR  --   --  1.1     INFECTIOUS Recent Labs  Lab 01/24/19 0047 01/25/19 0356  LATICACIDVEN 1.7  --   PROCALCITON 0.84 1.58     ENDOCRINE CBG (last 3)  Recent Labs    01/25/19 2338 Feb 17, 2019 0324 February 17, 2019 0823  GLUCAP 139* 135* 147*         IMAGING x48h  - image(s) personally visualized  -   highlighted in bold Mr Brain W Wo Contrast  Result Date: 01/24/2019 CLINICAL DATA:  Noncontrast head CT 01/23/2019 EXAM: MRI HEAD WITHOUT AND WITH CONTRAST TECHNIQUE: Multiplanar, multiecho  pulse sequences of the brain and surrounding structures were obtained without and with intravenous contrast. CONTRAST:  21mL GADAVIST GADOBUTROL 1 MMOL/ML IV SOLN COMPARISON:  Noncontrast head CT 01/23/2019, CT angiogram head 01/16/2019, head CT 01/15/2019, CT angiogram head/neck 01/07/2019 FINDINGS: Brain: Large right cerebral hemorrhage with prominent surrounding vasogenic edema, not significantly changed in size as compared to head CT 01/23/2019. Small acute/subacute infarcts surrounding the hemorrhage within the right frontal and temporal lobes again suspected, however, evaluation is limited due to susceptibility artifact from the large hemorrhage and from the right-sided vascular clip. Persistent near complete effacement of the right lateral ventricle with 9 mm leftward midline shift. Asymmetric prominence of the left lateral ventricle atrium and temporal horn, similar to prior, and concerning for entrapment. Right uncal herniation with mass effect upon the brainstem and severe effacement of the basilar cisterns, also present on prior exam. Redemonstrated trace intraventricular blood products. Diffuse SWI signal loss within the supratentorial and infratentorial sulci consistent with subarachnoid blood products and/or vasodilated vessels. There is T2 hyperintensity diffusely throughout the cerebral cortex as well as within the bilateral basal ganglia, and findings are suspicious for hypoxic ischemic injury. Significantly increased from prior exam, there is diffuse cerebellar swelling and T2 hyperintensity. Additionally, there is probable diffuse diffusion weighted signal abnormality throughout the cerebellum. Findings are also suspicious for sequela of hypoxic ischemic injury. Marked posterior fossa mass effect increased from prior exam and with cerebellar tonsillar herniation and mass effect upon the brainstem, as well as effacement of the fourth ventricle. Superimposed, there is a small acute/subacute infarct  within the paramedian right cerebellum. Precontrast T1 hyperintensity within the large right cerebral hemorrhage limits evaluation for enhancement at this site. No abnormal enhancement is appreciated elsewhere within the brain. Vascular: Right MCA bifurcation aneurysm clip. Proximal flow voids poorly assessed due  to severe intracranial mass effect. Skull and upper cervical spine: Sequela of prior right pterional craniotomy. Sinuses/Orbits: Visualized orbits demonstrate no acute abnormality. Mild scattered paranasal sinus mucosal thickening. Small bilateral mastoid effusions. These results were called by telephone at the time of interpretation on 01/24/2019 at 3:20 pm to provider Dr. Ashok Pall, Who verbally acknowledged these results. IMPRESSION: 1. Diffuse signal abnormality throughout the cerebral cortex and within the basal ganglia. Diffuse signal abnormality and suspected diffuse diffusion abnormality throughout the cerebellum. Findings likely reflect hypoxic ischemic injury. 2. Significant interval increase in cerebellar swelling and posterior fossa mass effect with new cerebellar tonsillar herniation and mass effect upon the brainstem, and effacement of the fourth ventricle. Superimposed small acute/subacute infarct within the paramedian right cerebellum. 3. Large right cerebral hemorrhage with prominent surrounding vasogenic edema, 9 mm leftward midline shift, right uncal herniation with mass effect upon the brainstem, and suspected entrapment of the left lateral ventricle. Findings are similar to prior CT. Suspected acute/subacute infarcts within the right frontal and temporal lobe surrounding the hemorrhage. 4. Diffuse SWI signal loss within the supratentorial and infratentorial sulci consistent with subarachnoid blood products and or vasodilated vessels. Electronically Signed   By: Kellie Simmering   On: 01/24/2019 15:34   Vas [place] Transcranial Doppler  Result Date: 01/25/2019  Transcranial Doppler  Indications: Subarachnoid hemorrhage. Limitations for diagnostic windows: Unable to insonate right transtemporal window. Unable to insonate left transtemporal window. Comparison Study: 01/21/19 Performing Technologist: Oliver Hum RVT  Examination Guidelines: A complete evaluation includes B-mode imaging, spectral Doppler, color Doppler, and power Doppler as needed of all accessible portions of each vessel. Bilateral testing is considered an integral part of a complete examination. Limited examinations for reoccurring indications may be performed as noted.  +----------+-------------+----------+-----------+-------+ RIGHT TCD Right VM (cm)Depth (cm)PulsatilityComment +----------+-------------+----------+-----------+-------+ Opthalmic     13.00                 1.16            +----------+-------------+----------+-----------+-------+ ICA siphon    11.00                 0.63            +----------+-------------+----------+-----------+-------+ Vertebral     -5.00                 2.78            +----------+-------------+----------+-----------+-------+  +----------+------------+----------+-----------+-------+ LEFT TCD  Left VM (cm)Depth (cm)PulsatilityComment +----------+------------+----------+-----------+-------+ Opthalmic    16.00                 1.12            +----------+------------+----------+-----------+-------+ ICA siphon   22.00                 1.12            +----------+------------+----------+-----------+-------+ Vertebral    -9.00                 2.74            +----------+------------+----------+-----------+-------+  +------------+-------+-------+             VM cm/sComment +------------+-------+-------+ Prox Basilar -7.00  2.67   +------------+-------+-------+ Summary:  Absentbitemporal and poor occipital windows greatly limits this study.Normalmean flow velocities in bilateral opthalmics, carotid siphons and lowmean flow velocities in both  vertebrals and basilar arteries of unclear significance. No definite evidence of  vasopsam noted.Severely increased pulsatility indices with damped waveforms in both vertebrals and basilar  arteries suggest severe increase in intracranial pressure. *See table(s) above for measurements and observations.  Diagnosing physician: Antony Contras MD Electronically signed by Antony Contras MD on 01/25/2019 at 8:17:46 AM.    Final       CRITICAL CARE Performed by: Otilio Carpen Trini Christiansen   Total critical care time: 35 minutes  Critical care time was exclusive of separately billable procedures and treating other patients.  Critical care was necessary to treat or prevent imminent or life-threatening deterioration.  Critical care was time spent personally by me on the following activities: development of treatment plan with patient and/or surrogate as well as nursing, discussions with consultants, evaluation of patient's response to treatment, examination of patient, obtaining history from patient or surrogate, ordering and performing treatments and interventions, ordering and review of laboratory studies, ordering and review of radiographic studies, pulse oximetry and re-evaluation of patient's condition.   Otilio Carpen Ijanae Macapagal, PA-C [facility]  Pager# [phone], if no answer [phone]

## 2019-02-03 NOTE — Progress Notes (Signed)
Chaplain visited as a result of referral from nurse.  Family was emotional distraught and stressed from the death.  Chaplain will follow-up with the family as needed.  Brion Aliment Chaplain Resident For questions concerning this note please contact me by pager [phone]

## 2019-02-03 NOTE — Progress Notes (Signed)
  NEUROSURGERY PROGRESS NOTE   No issues overnight.   EXAM:  BP 125/73   Pulse (!) 129   Temp 98.8 F (37.1 C) (Axillary)   Resp 18   Ht 5\' 5"  (1.651 m)   Wt 99.1 kg   LMP 05/29/2010   SpO2 96%   BMI 36.36 kg/m   No eye opening Pupils non-reactive (-) cough/gag Breathes over vent occasionally No motor responses to pain  IMPRESSION:  58 y.o. female s/p SAH with diffuse ischemic brain injury and exam c/w severe brainstem dysfunction. Do not believe there is any meaningful chance for recovery  PLAN: - Cont current level of care, will not escalate - DNR place - Family to decide on one-way extubation

## 2019-02-03 NOTE — Progress Notes (Signed)
RR was decreased to 6 and patient was able to trigger breaths for a total RR of 12. Patient was placed back on a RR of 18 after trial.

## 2019-02-03 NOTE — Progress Notes (Signed)
  Chaplain responded to a request to assist family following patient death.  Patient's husband, children and sister were present.  Chaplain allowed space for family to share what took place.  Chaplain answered questions as family is processing cremation or burial.   A list of funeral homes was provided as well as patient placement card.  Chaplain escorted the family back in the room and all prayed together.  Chaplain update RN.  Chaplain available for any further needs.  [place], MDiv.     02/23/2019 1952  Clinical Encounter Type  Visited With Family;Health care provider  Visit Type Death  Referral From Nurse  Consult/Referral To Chaplain  Spiritual Encounters  Spiritual Needs Grief support;Emotional;Prayer;Literature  Stress Factors  Family Stress Factors Loss

## 2019-02-03 DEATH — deceased
